# Patient Record
Sex: Male | Born: 1965 | Race: White | Hispanic: No | Marital: Married | Smoking: Never smoker
Health system: Southern US, Community
[De-identification: ages and names within clinical notes are randomized; demographics above are authoritative.]

## PROBLEM LIST (undated history)

## (undated) ENCOUNTER — Emergency Department (HOSPITAL_COMMUNITY): Admission: EM | Payer: Self-pay | Source: Home / Self Care

## (undated) DIAGNOSIS — M79672 Pain in left foot: Secondary | ICD-10-CM

## (undated) DIAGNOSIS — I4819 Other persistent atrial fibrillation: Secondary | ICD-10-CM

## (undated) DIAGNOSIS — F410 Panic disorder [episodic paroxysmal anxiety] without agoraphobia: Secondary | ICD-10-CM

## (undated) DIAGNOSIS — N486 Induration penis plastica: Secondary | ICD-10-CM

## (undated) DIAGNOSIS — R740 Nonspecific elevation of levels of transaminase and lactic acid dehydrogenase [LDH]: Secondary | ICD-10-CM

## (undated) DIAGNOSIS — R7301 Impaired fasting glucose: Secondary | ICD-10-CM

## (undated) DIAGNOSIS — Z8719 Personal history of other diseases of the digestive system: Secondary | ICD-10-CM

## (undated) DIAGNOSIS — I10 Essential (primary) hypertension: Secondary | ICD-10-CM

## (undated) DIAGNOSIS — E785 Hyperlipidemia, unspecified: Secondary | ICD-10-CM

## (undated) DIAGNOSIS — E1129 Type 2 diabetes mellitus with other diabetic kidney complication: Secondary | ICD-10-CM

## (undated) DIAGNOSIS — E8881 Metabolic syndrome: Secondary | ICD-10-CM

## (undated) DIAGNOSIS — M109 Gout, unspecified: Secondary | ICD-10-CM

## (undated) DIAGNOSIS — R7401 Elevation of levels of liver transaminase levels: Secondary | ICD-10-CM

## (undated) DIAGNOSIS — J3089 Other allergic rhinitis: Secondary | ICD-10-CM

## (undated) HISTORY — DX: Nonspecific elevation of levels of transaminase and lactic acid dehydrogenase (ldh): R74.0

## (undated) HISTORY — DX: Type 2 diabetes mellitus with other diabetic kidney complication: E11.29

## (undated) HISTORY — DX: Gout, unspecified: M10.9

## (undated) HISTORY — DX: Metabolic syndrome: E88.81

## (undated) HISTORY — DX: Pain in left foot: M79.672

## (undated) HISTORY — DX: Personal history of other diseases of the digestive system: Z87.19

## (undated) HISTORY — DX: Other persistent atrial fibrillation: I48.19

## (undated) HISTORY — DX: Other allergic rhinitis: J30.89

## (undated) HISTORY — DX: Hyperlipidemia, unspecified: E78.5

## (undated) HISTORY — DX: Elevation of levels of liver transaminase levels: R74.01

## (undated) HISTORY — DX: Impaired fasting glucose: R73.01

---

## 2012-03-21 ENCOUNTER — Encounter (HOSPITAL_COMMUNITY): Payer: Self-pay | Admitting: *Deleted

## 2012-03-21 ENCOUNTER — Emergency Department (HOSPITAL_COMMUNITY): Payer: BC Managed Care – PPO

## 2012-03-21 DIAGNOSIS — R42 Dizziness and giddiness: Secondary | ICD-10-CM | POA: Insufficient documentation

## 2012-03-21 DIAGNOSIS — Z8659 Personal history of other mental and behavioral disorders: Secondary | ICD-10-CM | POA: Insufficient documentation

## 2012-03-21 DIAGNOSIS — Z79899 Other long term (current) drug therapy: Secondary | ICD-10-CM | POA: Insufficient documentation

## 2012-03-21 DIAGNOSIS — R059 Cough, unspecified: Secondary | ICD-10-CM | POA: Insufficient documentation

## 2012-03-21 DIAGNOSIS — R05 Cough: Secondary | ICD-10-CM | POA: Insufficient documentation

## 2012-03-21 DIAGNOSIS — I4891 Unspecified atrial fibrillation: Secondary | ICD-10-CM | POA: Insufficient documentation

## 2012-03-21 DIAGNOSIS — R0602 Shortness of breath: Secondary | ICD-10-CM | POA: Insufficient documentation

## 2012-03-21 DIAGNOSIS — I10 Essential (primary) hypertension: Secondary | ICD-10-CM | POA: Insufficient documentation

## 2012-03-21 LAB — CBC WITH DIFFERENTIAL/PLATELET
Basophils Absolute: 0 10*3/uL (ref 0.0–0.1)
Basophils Relative: 0 % (ref 0–1)
Eosinophils Absolute: 0.4 10*3/uL (ref 0.0–0.7)
MCH: 31.4 pg (ref 26.0–34.0)
MCHC: 35.7 g/dL (ref 30.0–36.0)
Neutro Abs: 4.6 10*3/uL (ref 1.7–7.7)
Neutrophils Relative %: 49 % (ref 43–77)
Platelets: 184 10*3/uL (ref 150–400)

## 2012-03-21 LAB — POCT I-STAT, CHEM 8
Chloride: 105 mEq/L (ref 96–112)
HCT: 50 % (ref 39.0–52.0)
Hemoglobin: 17 g/dL (ref 13.0–17.0)
Potassium: 4.4 mEq/L (ref 3.5–5.1)
Sodium: 136 mEq/L (ref 135–145)

## 2012-03-21 LAB — POCT I-STAT TROPONIN I: Troponin i, poc: 0.03 ng/mL (ref 0.00–0.08)

## 2012-03-21 NOTE — ED Notes (Addendum)
Takes metoprolol and lisinopril.  After eating dinner, pt was seated and resting, developed fast HR, HA , light headedness and dry mouth. (Denies: cp, sob, dizziness), skin W&D. HR 142 in triage. No h/o same. Mentions cough for ~ 2 weeks, (denies: congestion, cold sx or fever), had 3 beers tonight, states, "probably drink too much". Took lopressor tonight.

## 2012-03-22 ENCOUNTER — Emergency Department (HOSPITAL_COMMUNITY)
Admission: EM | Admit: 2012-03-22 | Discharge: 2012-03-22 | Disposition: A | Discharge status: Home / Self Care | Payer: BC Managed Care – PPO | Attending: Emergency Medicine | Admitting: Emergency Medicine

## 2012-03-22 DIAGNOSIS — I4891 Unspecified atrial fibrillation: Secondary | ICD-10-CM

## 2012-03-22 HISTORY — DX: Essential (primary) hypertension: I10

## 2012-03-22 HISTORY — DX: Panic disorder (episodic paroxysmal anxiety): F41.0

## 2012-03-22 LAB — D-DIMER, QUANTITATIVE: D-Dimer, Quant: 0.29 ug/mL-FEU (ref 0.00–0.48)

## 2012-03-22 MED ORDER — DILTIAZEM HCL ER 180 MG PO CP24: 180.0000 mg | ORAL_CAPSULE | Freq: Every day | ORAL | Status: DC

## 2012-03-22 MED ORDER — DILTIAZEM HCL 30 MG PO TABS
30.0000 mg | ORAL_TABLET | Freq: Once | ORAL | Status: AC
  Administered 2012-03-22: 30 mg via ORAL
  Filled 2012-03-22: qty 1

## 2012-03-22 NOTE — ED Provider Notes (Signed)
History     CSN: [id number]  Arrival date & time 03/21/12  2018   First MD Initiated Contact with Patient 03/22/12 0029      Chief Complaint  Patient presents with  . Tachycardia    (Consider location/radiation/quality/duration/timing/severity/associated sxs/prior treatment) The history is provided by the patient.  Riley Moore is a 46 y.o. male here with palpitations, SOB. Acute onset of palpitations around 7 pm yesterday. Felt lightheaded but denies chest pain. He took his metoprolol and didn't help. He has been having nonproductive cough for 2 weeks and occasionally felt SOB. Denies recent travel or hx of DVT/PE.    Past Medical History  Diagnosis Date  . Hypertension   . Anxiety attack     History reviewed. No pertinent past surgical history.  No family history on file.  History  Substance Use Topics  . Smoking status: Never Smoker   . Smokeless tobacco: Not on file  . Alcohol Use: Yes     Comment: "probably too much"      Review of Systems  Cardiovascular: Positive for palpitations.  All other systems reviewed and are negative.    Allergies  Review of patient's allergies indicates no known allergies.  Home Medications  No current outpatient prescriptions on file.  BP 108/67  Pulse 80  Temp 98.5 F (36.9 C) (Oral)  Resp 15  SpO2 94%  Physical Exam  Nursing note and vitals reviewed. Constitutional: He is oriented to person, place, and time. He appears well-developed and well-nourished.  HENT:  Head: Normocephalic.  Mouth/Throat: Oropharynx is clear and moist.  Eyes: Conjunctivae normal are normal. Pupils are equal, round, and reactive to light.  Neck: Normal range of motion. Neck supple.  Cardiovascular: Normal heart sounds.        Irregular   Pulmonary/Chest: Effort normal and breath sounds normal. No respiratory distress. He has no wheezes. He has no rales.  Abdominal: Soft. Bowel sounds are normal. He exhibits no distension. There is no  tenderness. There is no rebound.  Musculoskeletal: Normal range of motion. He exhibits no edema and no tenderness.  Neurological: He is alert and oriented to person, place, and time.  Skin: Skin is warm and dry.  Psychiatric: He has a normal mood and affect. His behavior is normal. Judgment and thought content normal.    ED Course  Procedures (including critical care time)  Labs Reviewed  POCT I-STAT, CHEM 8 - Abnormal; Notable for the following:    Glucose, Bld 168 (*)     Calcium, Ion 1.01 (*)     All other components within normal limits  CBC WITH DIFFERENTIAL  POCT I-STAT TROPONIN I  D-DIMER, QUANTITATIVE  POCT I-STAT TROPONIN I   Dg Chest 2 View  03/21/2012  *RADIOLOGY REPORT*  Clinical Data: Tachycardia  CHEST - 2 VIEW  Comparison: None.  Findings: The heart and pulmonary vascularity are within normal limits.  The lungs are clear bilaterally.  No acute bony abnormality is seen.  IMPRESSION: No acute abnormality noted.   Original Report Authenticated By: Alcide Clever, M.D.      No diagnosis found.   Date: 03/22/2012  Rate: 121  Rhythm: atrial fibrillation  QRS Axis: normal  Intervals: normal  ST/T Wave abnormalities: normal  Conduction Disutrbances:none  Narrative Interpretation:   Old EKG Reviewed: changes noted    MDM  Riley Moore is a 46 y.o. male here with new onset afib. Will give d-dimer given SOB to r/o PE. HR between 90s-110. Will give cardizem.  Will consult cardiology.   1 AM  I talked to Dr. Veryl Speak, cardiologist on call. He recommend slowing with cardizem and start diltiazem 180mg  daily and get echo outpatient.   2:06 AM Patient's HR in the 80s. Felt better. Trop neg x 2, d-dimer negative. Will d/c home with outpatient f/u.       Richardean Canal, MD 03/22/12 [phone]

## 2012-03-22 NOTE — ED Notes (Signed)
Pt's name moved to B17 called to go back but pt did not answer. Pt's name moved back to waiting room.

## 2012-04-03 HISTORY — PX: TRANSTHORACIC ECHOCARDIOGRAM: SHX275

## 2012-04-05 ENCOUNTER — Emergency Department (HOSPITAL_COMMUNITY)
Admission: EM | Admit: 2012-04-05 | Discharge: 2012-04-05 | Disposition: A | Discharge status: Home / Self Care | Payer: BC Managed Care – PPO | Attending: Emergency Medicine | Admitting: Emergency Medicine

## 2012-04-05 ENCOUNTER — Encounter (HOSPITAL_COMMUNITY): Payer: Self-pay | Admitting: Emergency Medicine

## 2012-04-05 ENCOUNTER — Emergency Department (HOSPITAL_COMMUNITY): Payer: BC Managed Care – PPO

## 2012-04-05 DIAGNOSIS — I10 Essential (primary) hypertension: Secondary | ICD-10-CM | POA: Insufficient documentation

## 2012-04-05 DIAGNOSIS — F411 Generalized anxiety disorder: Secondary | ICD-10-CM | POA: Insufficient documentation

## 2012-04-05 DIAGNOSIS — Z7982 Long term (current) use of aspirin: Secondary | ICD-10-CM | POA: Insufficient documentation

## 2012-04-05 DIAGNOSIS — R079 Chest pain, unspecified: Secondary | ICD-10-CM | POA: Insufficient documentation

## 2012-04-05 DIAGNOSIS — Z79899 Other long term (current) drug therapy: Secondary | ICD-10-CM | POA: Insufficient documentation

## 2012-04-05 DIAGNOSIS — I4891 Unspecified atrial fibrillation: Secondary | ICD-10-CM | POA: Insufficient documentation

## 2012-04-05 LAB — CBC WITH DIFFERENTIAL/PLATELET
Basophils Relative: 0 % (ref 0–1)
Eosinophils Absolute: 0.3 10*3/uL (ref 0.0–0.7)
Eosinophils Relative: 2 % (ref 0–5)
Lymphs Abs: 4.1 10*3/uL — ABNORMAL HIGH (ref 0.7–4.0)
MCH: 30.8 pg (ref 26.0–34.0)
MCHC: 34.7 g/dL (ref 30.0–36.0)
MCV: 88.6 fL (ref 78.0–100.0)
Platelets: 209 10*3/uL (ref 150–400)
RBC: 5.33 MIL/uL (ref 4.22–5.81)
RDW: 12.7 % (ref 11.5–15.5)

## 2012-04-05 LAB — POCT I-STAT TROPONIN I

## 2012-04-05 LAB — BASIC METABOLIC PANEL
BUN: 14 mg/dL (ref 6–23)
Calcium: 9.7 mg/dL (ref 8.4–10.5)
GFR calc non Af Amer: >90 mL/min (ref >90)
Glucose, Bld: 137 mg/dL — ABNORMAL HIGH (ref 70–99)
Sodium: 133 mEq/L — ABNORMAL LOW (ref 135–145)

## 2012-04-05 NOTE — ED Notes (Signed)
Pt states "at 1100 i felt like someone poked me in the chest on the right side, it went away and then I had a light spell that lasted for a few seconds." Pt denies any other sx. Pt denies any symptoms currently.

## 2012-04-05 NOTE — ED Notes (Signed)
Pt c/o right sided chest pressure today with lightheadedness; pt sts seen in Nov for irregular HR; pt denies SOB at present

## 2012-04-05 NOTE — ED Provider Notes (Signed)
History     CSN: [id number]  Arrival date & time 04/05/12  1126   First MD Initiated Contact with Patient 04/05/12 1620      Chief Complaint  Patient presents with  . Chest Pain    (Consider location/radiation/quality/duration/timing/severity/associated sxs/prior treatment) The history is provided by the patient.  Riley Moore is a 46 y.o. male who presents with an episode of a right sided sharp chest pain that occurred around 11am today while sitting at his computer. Pt states pain lasted just few seconds, and state it followed by few seconds of light headiness. States no symptoms since then. Denies shortness of breath, nausea, diaphoresis. Recent diagnosis of afib. States taking medications for it. Has not seen cardiologist, but has an apt in two weeks. Denies palpitations. No other complaints. States has been very anxious about his afib and has been started on paxil.     Past Medical History  Diagnosis Date  . Hypertension   . Anxiety attack     History reviewed. No pertinent past surgical history.  History reviewed. No pertinent family history.  History  Substance Use Topics  . Smoking status: Never Smoker   . Smokeless tobacco: Not on file  . Alcohol Use: Yes     Comment: "probably too much"      Review of Systems  Constitutional: Negative for fever and chills.  Respiratory: Negative for chest tightness, shortness of breath and wheezing.   Cardiovascular: Positive for chest pain. Negative for palpitations and leg swelling.  Gastrointestinal: Negative for nausea, vomiting and abdominal pain.  Neurological: Positive for dizziness and light-headedness. Negative for weakness and headaches.  All other systems reviewed and are negative.    Allergies  Review of patient's allergies indicates no known allergies.  Home Medications   Current Outpatient Rx  Name  Route  Sig  Dispense  Refill  . ASPIRIN 325 MG PO TABS   Oral   Take 325 mg by mouth daily.         Marland Kitchen  DILTIAZEM HCL ER 180 MG PO CP24   Oral   Take 180 mg by mouth every morning.         . IBUPROFEN 200 MG PO TABS   Oral   Take 200 mg by mouth every 6 (six) hours as needed. For pain         . LISINOPRIL 20 MG PO TABS   Oral   Take 20 mg by mouth daily.         Marland Kitchen METOPROLOL TARTRATE 50 MG PO TABS   Oral   Take 50 mg by mouth 2 (two) times daily.         Marland Kitchen PAROXETINE HCL 10 MG PO TABS   Oral   Take 10 mg by mouth every morning.         Marland Kitchen PRAVASTATIN SODIUM 20 MG PO TABS   Oral   Take 20 mg by mouth daily.           BP 174/95  Pulse 53  Temp 97.8 F (36.6 C) (Oral)  Resp 18  SpO2 97%  Physical Exam  Constitutional: He is oriented to person, place, and time. He appears well-developed and well-nourished. No distress.  HENT:  Head: Normocephalic.  Eyes: Conjunctivae normal are normal.  Neck: Neck supple.  Cardiovascular: Normal rate, regular rhythm and normal heart sounds.   Pulmonary/Chest: Effort normal and breath sounds normal. No respiratory distress. He has no wheezes. He has no rales.  Abdominal:  Soft. Bowel sounds are normal. He exhibits no distension. There is no tenderness. There is no rebound.  Musculoskeletal: He exhibits no edema.  Neurological: He is alert and oriented to person, place, and time.  Skin: Skin is warm and dry.  Psychiatric: He has a normal mood and affect. His behavior is normal.    ED Course  Procedures (including critical care time)   Date: 04/05/2012  Rate:  64  Rhythm: normal sinus rhythm  QRS Axis: normal  Intervals: normal  ST/T Wave abnormalities: normal  Conduction Disutrbances:none  Narrative Interpretation:   Old EKG Reviewed: changes noted tachycardia resolved  Results for orders placed during the hospital encounter of 04/05/12  CBC WITH DIFFERENTIAL      Component Value Range   WBC 12.4 (*) 4.0 - 10.5 K/uL   RBC 5.33  4.22 - 5.81 MIL/uL   Hemoglobin 16.4  13.0 - 17.0 g/dL   HCT 40.9  81.1 - 91.4 %   MCV  88.6  78.0 - 100.0 fL   MCH 30.8  26.0 - 34.0 pg   MCHC 34.7  30.0 - 36.0 g/dL   RDW 78.2  95.6 - 21.3 %   Platelets 209  150 - 400 K/uL   Neutrophils Relative 55  43 - 77 %   Neutro Abs 6.8  1.7 - 7.7 K/uL   Lymphocytes Relative 33  12 - 46 %   Lymphs Abs 4.1 (*) 0.7 - 4.0 K/uL   Monocytes Relative 10  3 - 12 %   Monocytes Absolute 1.2 (*) 0.1 - 1.0 K/uL   Eosinophils Relative 2  0 - 5 %   Eosinophils Absolute 0.3  0.0 - 0.7 K/uL   Basophils Relative 0  0 - 1 %   Basophils Absolute 0.0  0.0 - 0.1 K/uL  BASIC METABOLIC PANEL      Component Value Range   Sodium 133 (*) 135 - 145 mEq/L   Potassium 3.7  3.5 - 5.1 mEq/L   Chloride 95 (*) 96 - 112 mEq/L   CO2 27  19 - 32 mEq/L   Glucose, Bld 137 (*) 70 - 99 mg/dL   BUN 14  6 - 23 mg/dL   Creatinine, Ser 0.86  0.50 - 1.35 mg/dL   Calcium 9.7  8.4 - 57.8 mg/dL   GFR calc non Af Amer >90  >90 mL/min   GFR calc Af Amer >90  >90 mL/min  POCT I-STAT TROPONIN I      Component Value Range   Troponin i, poc 0.00  0.00 - 0.08 ng/mL   Comment 3            Dg Chest 2 View  04/05/2012  *RADIOLOGY REPORT*  Clinical Data: Chest pain  CHEST - 2 VIEW  Comparison: March 21, 2012  Findings: Lungs clear.  Heart size and pulmonary vascularity are normal.  No adenopathy.  No bone lesions.  No pneumothorax.  IMPRESSION: Lungs clear.   Original Report Authenticated By: Bretta Bang, M.D.         1. Chest pain       MDM  PT with brief episode of right sided chest pain that was sharp in nature. Very atypical for ACS. No symptoms since 11am. Doubt PE: VS normal except for hypertension, pt is asymptomatic. Pt did not take his medications for hypertension today. Pt low risk for ACS. ECG completely normal. Pt appears very anxious. Discussed with Dr. Bebe Shaggy who has seen pt, agrees most likely not  cardiac, not a PE. Normal lungs on CXR. Pt stable for d/c home at this time. Follow up with PCP.        Lottie Mussel, PA 04/06/12 [phone]

## 2012-04-07 NOTE — ED Provider Notes (Signed)
Medical screening examination/treatment/procedure(s) were conducted as a shared visit with non-physician practitioner(s) and myself.  I personally evaluated the patient during the encounter  Pt resting comfortably, stable for d/c.  I doubt ACS/PE at this time.  EKG reviewed  Joya Gaskins, MD 04/07/12 [phone]

## 2012-04-19 ENCOUNTER — Encounter: Payer: Self-pay | Admitting: Cardiology

## 2012-04-19 ENCOUNTER — Ambulatory Visit (INDEPENDENT_AMBULATORY_CARE_PROVIDER_SITE_OTHER): Payer: BC Managed Care – PPO | Admitting: Cardiology

## 2012-04-19 VITALS — BP 135/100 | HR 59 | Ht 73.0 in | Wt 218.4 lb

## 2012-04-19 DIAGNOSIS — I4892 Unspecified atrial flutter: Secondary | ICD-10-CM

## 2012-04-19 NOTE — Progress Notes (Signed)
HPI The patient presents for a evaluation of atrial flutter. He was in the emergency room on November 18 with tachypalpitations. He was said to have atrial fibrillation though it appears likely to be flutter with 2:1 conduction. He apparently converted spontaneously.  He was placed on Cardizem when he left the ER. Since that time he has had no further sustained tachypalpitations. He did say that at one point prior to this when exercising he thought he noticed his heart rate increasing. He does say he has a stressful job. He drinks about 3 beers per night. He doesn't exercise routinely. He denies any chest pressure, neck or arm discomfort. He doesn't have any resting shortness of breath and he denies any PND or orthopnea. When he had his symptoms he had no chest discomfort. He had no presyncope or syncope. He did get anxious.  No Known Allergies  Current Outpatient Prescriptions  Medication Sig Dispense Refill  . aspirin 325 MG tablet Take 325 mg by mouth daily.      Marland Kitchen diltiazem (DILACOR XR) 180 MG 24 hr capsule Take 180 mg by mouth every morning.      Marland Kitchen lisinopril (PRINIVIL,ZESTRIL) 20 MG tablet Take 20 mg by mouth daily.      . metoprolol (LOPRESSOR) 50 MG tablet Take 50 mg by mouth 2 (two) times daily.        Past Medical History  Diagnosis Date  . Hypertension   . Anxiety attack   . Hyperlipemia     Past Surgical History  Procedure Date  . None     Family History  Problem Relation Age of Onset  . Heart disease Father     pacemaker, atrial fibrillation    History   Social History  . Marital Status: Single    Spouse Name: N/A    Number of Children: 2  . Years of Education: N/A   Occupational History  . Radio broadcast assistant    Social History Main Topics  . Smoking status: Never Smoker   . Smokeless tobacco: Not on file  . Alcohol Use: Yes     Comment: "probably too much"  . Drug Use: No  . Sexually Active:    Other Topics Concern  . Not on file   Social  History Narrative   Lives at home with wife and two children.      ROS: Positive for seasonal allergies.  Otherwise as stated in the HPI and negative for all other systems.   PHYSICAL EXAM BP 135/100  Pulse 59  Ht 6\' 1"  (1.854 m)  Wt 218 lb 6.4 oz (99.066 kg)  BMI 28.81 kg/m2 GENERAL:  Well appearing HEENT:  Pupils equal round and reactive, fundi not visualized, oral mucosa unremarkable NECK:  No jugular venous distention, waveform within normal limits, carotid upstroke brisk and symmetric, no bruits, no thyromegaly LYMPHATICS:  No cervical, inguinal adenopathy LUNGS:  Clear to auscultation bilaterally BACK:  No CVA tenderness CHEST:  Unremarkable HEART:  PMI not displaced or sustained,S1 and S2 within normal limits, no S3, no S4, no clicks, no rubs, no murmurs ABD:  Flat, positive bowel sounds normal in frequency in pitch, no bruits, no rebound, no guarding, no midline pulsatile mass, no hepatomegaly, no splenomegaly EXT:  2 plus pulses throughout, no edema, no cyanosis no clubbing SKIN:  No rashes no nodules NEURO:  Cranial nerves II through XII grossly intact, motor grossly intact throughout PSYCH:  Cognitively intact, oriented to person place and time  EKG:  Sinus  rhythm, rate 59, axis within normal limits, intervals within normal limits, no acute ST-T wave changes.04/19/2012   ASSESSMENT AND PLAN   Atrial flutter - We discussed the mechanism of this at length. He will remain on the meds as listed. I will check an echocardiogram. He will start an exercise regimen and if he has tachypalpitations with this he will let me know as I would likely need to screen him with a treadmill test or a monitor. He does not need anticoagulation or aspirin.    Hypertension - He will keep a blood pressure diary. Otherwise meds will remain as listed.  Alcohol use - We discussed the fact that his current use is above guidelines suggestions. He will try to cut back.

## 2012-04-19 NOTE — Patient Instructions (Addendum)
The current medical regimen is effective;  continue present plan and medications.  Your physician has requested that you have an echocardiogram. Echocardiography is a painless test that uses sound waves to create images of your heart. It provides your doctor with information about the size and shape of your heart and how well your heart's chambers and valves are working. This procedure takes approximately one hour. There are no restrictions for this procedure.  Follow up with Dr Antoine Poche in 3 months.

## 2012-04-20 ENCOUNTER — Telehealth: Payer: Self-pay | Admitting: Cardiology

## 2012-04-20 NOTE — Telephone Encounter (Signed)
New problem:   [facility] [place]

## 2012-04-22 MED ORDER — DILTIAZEM HCL ER 180 MG PO CP24: 180.0000 mg | ORAL_CAPSULE | Freq: Every morning | ORAL | Status: DC

## 2012-04-22 NOTE — Telephone Encounter (Signed)
Pt needed diltiazem sent in. RX sent into pharmacy.

## 2012-04-25 ENCOUNTER — Ambulatory Visit (HOSPITAL_COMMUNITY): Payer: BC Managed Care – PPO | Attending: Cardiology

## 2012-04-25 DIAGNOSIS — I4892 Unspecified atrial flutter: Secondary | ICD-10-CM | POA: Insufficient documentation

## 2012-04-25 DIAGNOSIS — R000 Tachycardia, unspecified: Secondary | ICD-10-CM | POA: Insufficient documentation

## 2012-04-25 DIAGNOSIS — R002 Palpitations: Secondary | ICD-10-CM | POA: Insufficient documentation

## 2012-04-25 NOTE — Progress Notes (Signed)
Echocardiogram performed.  

## 2012-05-09 ENCOUNTER — Encounter: Payer: Self-pay | Admitting: Cardiology

## 2012-07-22 ENCOUNTER — Encounter: Payer: Self-pay | Admitting: Cardiology

## 2012-07-22 ENCOUNTER — Ambulatory Visit (INDEPENDENT_AMBULATORY_CARE_PROVIDER_SITE_OTHER): Payer: BC Managed Care – PPO | Admitting: Cardiology

## 2012-07-22 VITALS — BP 134/78 | HR 55 | Ht 74.0 in | Wt 217.0 lb

## 2012-07-22 DIAGNOSIS — I4892 Unspecified atrial flutter: Secondary | ICD-10-CM

## 2012-07-22 DIAGNOSIS — I25810 Atherosclerosis of coronary artery bypass graft(s) without angina pectoris: Secondary | ICD-10-CM | POA: Insufficient documentation

## 2012-07-22 NOTE — Patient Instructions (Addendum)
Please stop your Cardiazem.  Continue all other medications as listed.  Follow up as needed.

## 2012-07-22 NOTE — Progress Notes (Signed)
   HPI The patient presents for a evaluation of atrial flutter. He was in the emergency room on November 18 with tachypalpitations. He was said to have atrial fibrillation though it appears likely to be flutter with 2:1 conduction. He converted spontaneously.  This is his second visit in the office. Since I last saw him he try to come off of his Cardizem and he did have some palpitations so he restarted. He's not had any sustained tachypalpitations. She staying active and otherwise feels well. The patient denies any new symptoms such as chest discomfort, neck or arm discomfort. There has been no new shortness of breath, PND or orthopnea. There have been no reported palpitations, presyncope or syncope. He still drinking 3 beers a night.  No Known Allergies  Current Outpatient Prescriptions  Medication Sig Dispense Refill  . aspirin 325 MG tablet Take 325 mg by mouth daily.      . cetirizine (ZYRTEC) 10 MG tablet Take 10 mg by mouth daily.      Marland Kitchen diltiazem (DILACOR XR) 180 MG 24 hr capsule Take 1 capsule (180 mg total) by mouth every morning.  30 capsule  6  . lisinopril (PRINIVIL,ZESTRIL) 20 MG tablet Take 20 mg by mouth daily.      . metoprolol (LOPRESSOR) 50 MG tablet Take 50 mg by mouth 2 (two) times daily.       No current facility-administered medications for this visit.    Past Medical History  Diagnosis Date  . Hypertension   . Anxiety attack   . Hyperlipemia     Past Surgical History  Procedure Laterality Date  . None      ROS: As stated in the HPI and negative for all other systems.   PHYSICAL EXAM BP 134/78  Pulse 55  Ht 6\' 2"  (1.88 m)  Wt 217 lb (98.431 kg)  BMI 27.85 kg/m2 GENERAL:  Well appearing NECK:  No jugular venous distention, waveform within normal limits, carotid upstroke brisk and symmetric, no bruits, no thyromegaly LUNGS:  Clear to auscultation bilaterally CHEST:  Unremarkable HEART:  PMI not displaced or sustained,S1 and S2 within normal limits, no  S3, no S4, no clicks, no rubs, no murmurs ABD:  Flat, positive bowel sounds normal in frequency in pitch, no bruits, no rebound, no guarding, no midline pulsatile mass, no hepatomegaly, no splenomegaly EXT:  2 plus pulses throughout, no edema, no cyanosis no clubbing  EKG:  Sinus rhythm, rate 55, axis within normal limits, intervals within normal limits, no acute ST-T wave changes.07/22/2012   ASSESSMENT AND PLAN   Atrial flutter - We discussed the mechanism of this at length. He is going to try to stop the Cardizem again. If palpitations return he will restart this. If and when he has these palpitations in the future he will return for followup and we would consider ablation.  Hypertension - The blood pressure is at target. No change in medications is indicated. We will continue with therapeutic lifestyle changes (TLC).  Alcohol use - We again discussed the fact that his current use is above guidelines suggestions. He will try to cut back.

## 2013-07-28 ENCOUNTER — Ambulatory Visit: Payer: BC Managed Care – PPO | Admitting: Diagnostic Neuroimaging

## 2014-03-16 ENCOUNTER — Other Ambulatory Visit: Payer: Self-pay

## 2014-03-16 NOTE — Telephone Encounter (Signed)
Pt called stating he has a new patient appt with Dr Anitra Lauth next Thursday. His previous doctor would not refill his medications and wondered if Dr Anitra Lauth can Rx Metoprolol 50 mg and lisinopril 20mg  until his appt. Please call him at [phone] to let him know. Thanks

## 2014-03-16 NOTE — Telephone Encounter (Signed)
Tell him I'm sorry but I simply cannot do this if I've never seen him before BUT I see that his cardiologist is Dr. Percival Spanish so I recommend he call their office and request refill.-thx

## 2014-03-16 NOTE — Telephone Encounter (Signed)
Spoke with pt, advised message from Dr McGowen. Pt understood. 

## 2014-03-22 ENCOUNTER — Ambulatory Visit (INDEPENDENT_AMBULATORY_CARE_PROVIDER_SITE_OTHER): Payer: BC Managed Care – PPO | Admitting: Family Medicine

## 2014-03-22 ENCOUNTER — Encounter: Payer: Self-pay | Admitting: Family Medicine

## 2014-03-22 VITALS — BP 124/86 | HR 55 | Temp 98.3°F | Resp 18 | Ht 74.0 in | Wt 218.0 lb

## 2014-03-22 DIAGNOSIS — R7401 Elevation of levels of liver transaminase levels: Secondary | ICD-10-CM

## 2014-03-22 DIAGNOSIS — R740 Nonspecific elevation of levels of transaminase and lactic acid dehydrogenase [LDH]: Secondary | ICD-10-CM

## 2014-03-22 DIAGNOSIS — Z23 Encounter for immunization: Secondary | ICD-10-CM | POA: Diagnosis not present

## 2014-03-22 DIAGNOSIS — I10 Essential (primary) hypertension: Secondary | ICD-10-CM | POA: Insufficient documentation

## 2014-03-22 DIAGNOSIS — R7301 Impaired fasting glucose: Secondary | ICD-10-CM

## 2014-03-22 DIAGNOSIS — I4892 Unspecified atrial flutter: Secondary | ICD-10-CM

## 2014-03-22 DIAGNOSIS — E785 Hyperlipidemia, unspecified: Secondary | ICD-10-CM

## 2014-03-22 DIAGNOSIS — E782 Mixed hyperlipidemia: Secondary | ICD-10-CM

## 2014-03-22 DIAGNOSIS — F1010 Alcohol abuse, uncomplicated: Secondary | ICD-10-CM

## 2014-03-22 LAB — CBC WITH DIFFERENTIAL/PLATELET
BASOS ABS: 0 10*3/uL (ref 0.0–0.1)
BASOS PCT: 0.4 % (ref 0.0–3.0)
EOS ABS: 0.3 10*3/uL (ref 0.0–0.7)
Eosinophils Relative: 4 % (ref 0.0–5.0)
HCT: 50.3 % (ref 39.0–52.0)
Hemoglobin: 16.8 g/dL (ref 13.0–17.0)
LYMPHS PCT: 30.5 % (ref 12.0–46.0)
Lymphs Abs: 2.6 10*3/uL (ref 0.7–4.0)
MCHC: 33.4 g/dL (ref 30.0–36.0)
MCV: 91.7 fl (ref 78.0–100.0)
MONO ABS: 0.7 10*3/uL (ref 0.1–1.0)
Monocytes Relative: 8.3 % (ref 3.0–12.0)
NEUTROS PCT: 56.8 % (ref 43.0–77.0)
Neutro Abs: 4.7 10*3/uL (ref 1.4–7.7)
PLATELETS: 183 10*3/uL (ref 150.0–400.0)
RBC: 5.48 Mil/uL (ref 4.22–5.81)
RDW: 12.9 % (ref 11.5–15.5)
WBC: 8.4 10*3/uL (ref 4.0–10.5)

## 2014-03-22 LAB — COMPREHENSIVE METABOLIC PANEL
ALT: 102 U/L — AB (ref 0–53)
AST: 64 U/L — ABNORMAL HIGH (ref 0–37)
Albumin: 4.2 g/dL (ref 3.5–5.2)
Alkaline Phosphatase: 49 U/L (ref 39–117)
BUN: 16 mg/dL (ref 6–23)
CALCIUM: 9.6 mg/dL (ref 8.4–10.5)
CHLORIDE: 103 meq/L (ref 96–112)
CO2: 28 meq/L (ref 19–32)
Creatinine, Ser: 0.9 mg/dL (ref 0.4–1.5)
GFR: 97.6 mL/min (ref >60.00)
Glucose, Bld: 112 mg/dL — ABNORMAL HIGH (ref 70–99)
Potassium: 4.9 mEq/L (ref 3.5–5.1)
Sodium: 139 mEq/L (ref 135–145)
Total Bilirubin: 0.9 mg/dL (ref 0.2–1.2)
Total Protein: 7.1 g/dL (ref 6.0–8.3)

## 2014-03-22 LAB — LIPID PANEL
CHOLESTEROL: 202 mg/dL — AB (ref 0–200)
HDL: 32.8 mg/dL — ABNORMAL LOW (ref >39.00)
NONHDL: 169.2
TRIGLYCERIDES: 348 mg/dL — AB (ref 0.0–149.0)
Total CHOL/HDL Ratio: 6
VLDL: 69.6 mg/dL — ABNORMAL HIGH (ref 0.0–40.0)

## 2014-03-22 LAB — TSH: TSH: 1.96 u[IU]/mL (ref 0.35–4.50)

## 2014-03-22 MED ORDER — CLOTRIMAZOLE-BETAMETHASONE 1-0.05 % EX CREA: 1.0000 "application " | TOPICAL_CREAM | Freq: Two times a day (BID) | CUTANEOUS | Status: DC

## 2014-03-22 MED ORDER — METOPROLOL TARTRATE 50 MG PO TABS: 50.0000 mg | ORAL_TABLET | Freq: Two times a day (BID) | ORAL | Status: DC

## 2014-03-22 NOTE — Assessment & Plan Note (Signed)
Continue welchol. FLP, liver panel today.

## 2014-03-22 NOTE — Assessment & Plan Note (Addendum)
Controlled.  Will change his metoprolol back to lopressor 50mg  bid like he was on before recent mistake/inadvertent switch to Toprol XL. Lytes and TSH check today. Keep annual f/u with Dr. Percival Spanish.

## 2014-03-22 NOTE — Progress Notes (Signed)
Pre visit review using our clinic review tool, if applicable. No additional management support is needed unless otherwise documented below in the visit note. 

## 2014-03-22 NOTE — Assessment & Plan Note (Signed)
The current medical regimen is effective;  continue present plan and medications. Lytes/cr today. 

## 2014-03-22 NOTE — Assessment & Plan Note (Signed)
I told him that cutting back to about 2 drinks a day would be more appropriate intake, plus would likely make his bp and atrial flutter easier to control.

## 2014-03-22 NOTE — Progress Notes (Signed)
Office Note 03/22/2014  CC:  Chief Complaint  Patient presents with  . Establish Care    HPI:  Riley Moore is a 48 y.o. White male who is here to establish care. Patient's most recent primary MD: Dr. Greta Doom. Old records were not reviewed prior to or during today's visit.  Establishing care with me b/c "Dr. Greta Doom got taken over by [facility] and now they will only see you for one problem at a time". He is fasting today, says it has been "a while" since he has had cholesterol check, doesn't recall when other labs done last. Says he works a busy, stressful job, plus had trouble getting in to see Dr. Greta Doom in a timely manner.  Says he has hx of armpit rash that responded to lotrisone in the past.  Recently he got a similar rash in GU area and he has been applying this cream to it and it has been helping but he is out of the cream now, asks for RF.  Says most recent RF of metoprolol by Dr. Greta Doom office was wrong: he was on lopressor 50mg  bid and they called in Toprol XL 50mg  qd.  Currently denies any palpitations, SOB, or chest pain.  When he exercises vigorously, which is apparently rare, he sometimes feels his heart begin to race so he stops.  This has made him "Antionette Poles" about exercise now.  Past Medical History  Diagnosis Date  . Hypertension   . Anxiety attack   . Hyperlipemia     Intolerant of statins: tolerates welchol  . Perennial allergic rhinitis   . Atrial flutter 2013    Dr. Percival Spanish    Past Surgical History  Procedure Laterality Date  . None      Family History  Problem Relation Age of Onset  . Heart disease Father     pacemaker, atrial fibrillation  . Hypertension Father     History   Social History  . Marital Status: Single    Spouse Name: N/A    Number of Children: 2  . Years of Education: N/A   Occupational History  . Chief Financial Officer    Social History Main Topics  . Smoking status: Never Smoker   . Smokeless tobacco: Never Used  . Alcohol  Use: 2.4 - 3.0 oz/week    4-5 Cans of beer per week     Comment: daily  . Drug Use: No  . Sexual Activity: Not on file   Other Topics Concern  . Not on file   Social History Narrative   Lives at home with wife and two children.     Occupation: Chief Financial Officer.   No tob or drugs.  Alc: 4 beers per day.  Denies hx of alc problems/abuse.  However, he says he tried quitting in the past and after a few days he started to have "panic attacks".    Outpatient Encounter Prescriptions as of 03/22/2014  Medication Sig  . aspirin 325 MG tablet Take 325 mg by mouth daily.  . cetirizine (ZYRTEC) 10 MG tablet Take 10 mg by mouth daily.  . clonazePAM (KLONOPIN) 0.5 MG tablet Take 0.5 mg by mouth as needed for anxiety.  Marland Kitchen diltiazem (CARDIZEM CD) 180 MG 24 hr capsule Take 180 mg by mouth daily.  Marland Kitchen lisinopril (PRINIVIL,ZESTRIL) 40 MG tablet Take 40 mg by mouth daily.  . metoprolol (LOPRESSOR) 50 MG tablet Take 1 tablet (50 mg total) by mouth 2 (two) times daily.  . [DISCONTINUED] metoprolol (LOPRESSOR) 50 MG tablet  Take 50 mg by mouth 2 (two) times daily.  . clotrimazole-betamethasone (LOTRISONE) cream Apply 1 application topically 2 (two) times daily.  . [DISCONTINUED] lisinopril (PRINIVIL,ZESTRIL) 20 MG tablet Take 20 mg by mouth daily.    No Known Allergies  ROS Review of Systems  Constitutional: Negative for fever and fatigue.  HENT: Negative for congestion and sore throat.   Eyes: Negative for visual disturbance.  Respiratory: Negative for cough.   Cardiovascular: Negative for chest pain.  Gastrointestinal: Negative for nausea and abdominal pain.  Genitourinary: Negative for dysuria.  Musculoskeletal: Negative for back pain and joint swelling.  Skin: Positive for rash (GU region, itchy).  Neurological: Negative for weakness and headaches.  Hematological: Negative for adenopathy.  Psychiatric/Behavioral: The patient is nervous/anxious (stressed).     PE; Blood pressure 124/86,  pulse 55, temperature 98.3 F (36.8 C), temperature source Temporal, resp. rate 18, height 6\' 2"  (1.88 m), weight 218 lb (98.884 kg), SpO2 98 %. Gen: Alert, well appearing.  Patient is oriented to person, place, time, and situation. ONG:EXBM: no injection, icteris, swelling, or exudate.  EOMI, PERRLA. Mouth: lips without lesion/swelling.  Oral mucosa pink and moist. Oropharynx without erythema, exudate, or swelling.  Neck: supple/nontender.  No LAD, mass, or TM.  Carotid pulses 2+ bilaterally, without bruits. CV: RRR, no m/r/g.   LUNGS: CTA bilat, nonlabored resps, good aeration in all lung fields. EXT: no clubbing, cyanosis, or edema.   Pertinent labs:  none   ASSESSMENT AND PLAN:   New pt; obtain old records.  Essential hypertension The current medical regimen is effective;  continue present plan and medications. Lytes/cr today.  Atrial flutter Controlled.  Will change his metoprolol back to lopressor 50mg  bid like he was on before recent mistake/inadvertent switch to Toprol XL. Lytes and TSH check today. Keep annual f/u with Dr. Percival Spanish.  Hyperlipidemia Continue welchol. FLP, liver panel today.  Alcohol abuse I told him that cutting back to about 2 drinks a day would be more appropriate intake, plus would likely make his bp and atrial flutter easier to control.   An After Visit Summary was printed and given to the patient.  Return in about 1 year (around 03/23/2015).

## 2014-03-23 ENCOUNTER — Other Ambulatory Visit (INDEPENDENT_AMBULATORY_CARE_PROVIDER_SITE_OTHER): Payer: BC Managed Care – PPO

## 2014-03-23 ENCOUNTER — Telehealth: Payer: Self-pay | Admitting: Family Medicine

## 2014-03-23 DIAGNOSIS — R7309 Other abnormal glucose: Secondary | ICD-10-CM

## 2014-03-23 DIAGNOSIS — R739 Hyperglycemia, unspecified: Secondary | ICD-10-CM

## 2014-03-23 LAB — HEMOGLOBIN A1C: Hgb A1c MFr Bld: 5.9 % (ref 4.6–6.5)

## 2014-03-23 LAB — LDL CHOLESTEROL, DIRECT: Direct LDL: 106.3 mg/dL

## 2014-03-23 NOTE — Addendum Note (Signed)
Addended by: Jannette Spanner on: 03/23/2014 01:14 PM   Modules accepted: Orders

## 2014-03-23 NOTE — Telephone Encounter (Signed)
emmi emailed °

## 2014-03-24 LAB — HEPATITIS B SURFACE ANTIGEN: HEP B S AG: NEGATIVE

## 2014-03-24 LAB — HEPATITIS C ANTIBODY: HCV AB: NEGATIVE

## 2014-03-26 ENCOUNTER — Encounter: Payer: Self-pay | Admitting: Family Medicine

## 2014-03-27 ENCOUNTER — Encounter: Payer: Self-pay | Admitting: Family Medicine

## 2014-04-10 ENCOUNTER — Telehealth: Payer: Self-pay | Admitting: Family Medicine

## 2014-04-10 NOTE — Telephone Encounter (Signed)
Patient came into office today and states that he is now interested in seeing nutritionist.  Please advise.

## 2014-04-11 ENCOUNTER — Other Ambulatory Visit: Payer: Self-pay | Admitting: Family Medicine

## 2014-04-11 DIAGNOSIS — R7301 Impaired fasting glucose: Secondary | ICD-10-CM

## 2014-04-11 DIAGNOSIS — E785 Hyperlipidemia, unspecified: Secondary | ICD-10-CM

## 2014-04-11 NOTE — Telephone Encounter (Signed)
Nutritionist order entered.

## 2014-04-23 ENCOUNTER — Telehealth: Payer: Self-pay | Admitting: Family Medicine

## 2014-04-23 MED ORDER — LISINOPRIL 40 MG PO TABS: 40.0000 mg | ORAL_TABLET | Freq: Every day | ORAL | Status: DC

## 2014-04-23 MED ORDER — DILTIAZEM HCL ER COATED BEADS 180 MG PO CP24: 180.0000 mg | ORAL_CAPSULE | Freq: Every day | ORAL | Status: DC

## 2014-04-23 NOTE — Telephone Encounter (Signed)
Pt needed rfs.  Rx sent.

## 2014-04-23 NOTE — Telephone Encounter (Signed)
Patient has a question about his Rx. Please call him.

## 2014-05-21 ENCOUNTER — Ambulatory Visit: Payer: BC Managed Care – PPO | Admitting: Skilled Nursing Facility1

## 2014-05-28 ENCOUNTER — Encounter: Payer: Self-pay | Admitting: Family Medicine

## 2014-06-21 ENCOUNTER — Encounter: Payer: Self-pay | Admitting: Nurse Practitioner

## 2014-06-21 ENCOUNTER — Ambulatory Visit (INDEPENDENT_AMBULATORY_CARE_PROVIDER_SITE_OTHER): Payer: BLUE CROSS/BLUE SHIELD | Admitting: Nurse Practitioner

## 2014-06-21 VITALS — BP 124/74 | HR 48 | Temp 98.1°F | Ht 74.0 in | Wt 220.0 lb

## 2014-06-21 DIAGNOSIS — M7662 Achilles tendinitis, left leg: Secondary | ICD-10-CM

## 2014-06-21 DIAGNOSIS — H6983 Other specified disorders of Eustachian tube, bilateral: Secondary | ICD-10-CM

## 2014-06-21 MED ORDER — FLUTICASONE PROPIONATE 50 MCG/ACT NA SUSP: 1.0000 | Freq: Two times a day (BID) | NASAL | Status: DC

## 2014-06-21 NOTE — Progress Notes (Signed)
Subjective:     Riley Moore is a 49 y.o. male presents w/c/o L heel pain & "stopped up ears". Heel: reports 3 days pain relieved by 600 mg ibuprophen 3-4 times daily. Knot on back of heel, tender to touch. Started about 3 weeks w/mild transient pain. Thinks new boots & increase in exercise have caused pain & swelling: stopped wearing boots, but is walking about 4 mi daily. Ear: Duration 3 weeks. [facility]en at [facility]-started on prednisone & ABX. Some relief, but ear feels stuffy, no pain. Reports Chronic sinsus issues. Frequent clearing of throat during exam.  The following portions of the patient's history were reviewed and updated as appropriate: allergies, current medications, past medical history, past social history, past surgical history and problem list.  Review of Systems Constitutional: negative for fatigue and fevers Ears, nose, mouth, throat, and face: negative for sore throat Respiratory: negative for cough    Objective:    BP 124/74 mmHg  Pulse 48  Temp(Src) 98.1 F (36.7 C) (Oral)  Ht 6\' 2"  (1.88 m)  Wt 220 lb (99.791 kg)  BMI 28.23 kg/m2  SpO2 98% BP 124/74 mmHg  Pulse 48  Temp(Src) 98.1 F (36.7 C) (Oral)  Ht 6\' 2"  (1.88 m)  Wt 220 lb (99.791 kg)  BMI 28.23 kg/m2  SpO2 98% General appearance: alert, cooperative, appears stated age and no distress Head: Normocephalic, without obvious abnormality, atraumatic Eyes: negative findings: lids and lashes normal and conjunctivae and sclerae normal Ears: Bilat TM retracted, wet appearance. bones visible bilat. Throat: lips, mucosa, and tongue normal; teeth and gums normal Extremities: swelling at achilles insertion site & tender adjacent to nodule, lateral aspect of heel. Tender back of heel w/dorsiflexion    Assessment:Plan  1. Eustachian tube dysfunction, bilateral Start daily sinus rinses for best sinus health Use flonase at least 2 weeks - fluticasone (FLONASE) 50 MCG/ACT nasal spray; Place 1 spray into both nostrils  2 (two) times daily.  Dispense: 16 g; Refill: 1 F/u if no improvement  2. Achilles tendinitis of left lower extremity Stretches discussed & HO given Heat 3 times daily ibuprophen & tylenol twice daily w/food See pt instructions - Ambulatory referral to Orthopedics

## 2014-06-21 NOTE — Patient Instructions (Signed)
Perform exercises below. Hold positions for 10 seconds, repeat 5 times, 2-3 times daily. Use moist heat 3-4 times daily (sock w/rice). Continue ibuprophen 600 mg twice daily with food. You may add 1000 mg tylenol.  See orthopedist for further eval & treatment.  For ears: start flonase-1 spray each nare twice daily for at least 2 weeks. Use 1 week longer than you have symptoms. Start daily sinus rinse- Neilmed sinus rinse-for best sinus hygiene. Use mechanical method to improve eustachian tube function-do as often as you can think of it. Let us know if not feeling better.              Achilles Tendinitis  with Rehab Achilles tendinitis is a disorder of the Achilles tendon. The Achilles tendon connects the large calf muscles (Gastrocnemius and Soleus) to the heel bone (calcaneus). This tendon is sometimes called the heel cord. It is important for pushing-off and standing on your toes and is important for walking, running, or jumping. Tendinitis is often caused by overuse and repetitive microtrauma. SYMPTOMS  Pain, tenderness, swelling, warmth, and redness may occur over the Achilles tendon even at rest.  Pain with pushing off, or flexing or extending the ankle.  Pain that is worsened after or during activity. CAUSES   Overuse sometimes seen with rapid increase in exercise programs or in sports requiring running and jumping.  Poor physical conditioning (strength and flexibility or endurance).  Running sports, especially training running down hills.  Inadequate warm-up before practice or play or failure to stretch before participation.  Injury to the tendon. PREVENTION   Warm up and stretch before practice or competition.  Allow time for adequate rest and recovery between practices and competition.  Keep up conditioning.  Keep up ankle and leg flexibility.  Improve or keep muscle strength and endurance.  Improve cardiovascular fitness.  Use proper technique.  Use proper  equipment (shoes, skates).  To help prevent recurrence, taping, protective strapping, or an adhesive bandage may be recommended for several weeks after healing is complete. PROGNOSIS   Recovery may take weeks to several months to heal.  Longer recovery is expected if symptoms have been prolonged.  Recovery is usually quicker if the inflammation is due to a direct blow as compared with overuse or sudden strain. RELATED COMPLICATIONS   Healing time will be prolonged if the condition is not correctly treated. The injury must be given plenty of time to heal.  Symptoms can reoccur if activity is resumed too soon.  Untreated, tendinitis may increase the risk of tendon rupture requiring additional time for recovery and possibly surgery. TREATMENT   The first treatment consists of rest anti-inflammatory medication, and ice to relieve the pain.  Stretching and strengthening exercises after resolution of pain will likely help reduce the risk of recurrence. Referral to a physical therapist or athletic trainer for further evaluation and treatment may be helpful.  A walking boot or cast may be recommended to rest the Achilles tendon. This can help break the cycle of inflammation and microtrauma.  Arch supports (orthotics) may be prescribed or recommended by your caregiver as an adjunct to therapy and rest.  Surgery to remove the inflamed tendon lining or degenerated tendon tissue is rarely necessary and has shown less than predictable results. MEDICATION   Nonsteroidal anti-inflammatory medications, such as aspirin and ibuprofen, may be used for pain and inflammation relief. Do not take within 7 days before surgery. Take these as directed by your caregiver. Contact your caregiver immediately if any  bleeding, stomach upset, or signs of allergic reaction occur. Other minor pain relievers, such as acetaminophen, may also be used.  Pain relievers may be prescribed as necessary by your caregiver. Do  not take prescription pain medication for longer than 4 to 7 days. Use only as directed and only as much as you need.  Cortisone injections are rarely indicated. Cortisone injections may weaken tendons and predispose to rupture. It is better to give the condition more time to heal than to use them. HEAT AND COLD  Cold is used to relieve pain and reduce inflammation for acute and chronic Achilles tendinitis. Cold should be applied for 10 to 15 minutes every 2 to 3 hours for inflammation and pain and immediately after any activity that aggravates your symptoms. Use ice packs or an ice massage.  Heat may be used before performing stretching and strengthening activities prescribed by your caregiver. Use a heat pack or a warm soak. SEEK MEDICAL CARE IF:  Symptoms get worse or do not improve in 2 weeks despite treatment.  New, unexplained symptoms develop. Drugs used in treatment may produce side effects. EXERCISES RANGE OF MOTION (ROM) AND STRETCHING EXERCISES - Achilles Tendinitis  These exercises may help you when beginning to rehabilitate your injury. Your symptoms may resolve with or without further involvement from your physician, physical therapist or athletic trainer. While completing these exercises, remember:   Restoring tissue flexibility helps normal motion to return to the joints. This allows healthier, less painful movement and activity.  An effective stretch should be held for at least 30 seconds.  A stretch should never be painful. You should only feel a gentle lengthening or release in the stretched tissue. STRETCH - Gastroc, Standing   Place hands on wall.  Extend right / left leg, keeping the front knee somewhat bent.  Slightly point your toes inward on your back foot.  Keeping your right / left heel on the floor and your knee straight, shift your weight toward the wall, not allowing your back to arch.  You should feel a gentle stretch in the right / left calf. Hold this  position for __________ seconds. Repeat __________ times. Complete this stretch __________ times per day. STRETCH - Soleus, Standing   Place hands on wall.  Extend right / left leg, keeping the other knee somewhat bent.  Slightly point your toes inward on your back foot.  Keep your right / left heel on the floor, bend your back knee, and slightly shift your weight over the back leg so that you feel a gentle stretch deep in your back calf.  Hold this position for __________ seconds. Repeat __________ times. Complete this stretch __________ times per day. STRETCH - Gastrocsoleus, Standing  Note: This exercise can place a lot of stress on your foot and ankle. Please complete this exercise only if specifically instructed by your caregiver.   Place the ball of your right / left foot on a step, keeping your other foot firmly on the same step.  Hold on to the wall or a rail for balance.  Slowly lift your other foot, allowing your body weight to press your heel down over the edge of the step.  You should feel a stretch in your right / left calf.  Hold this position for __________ seconds.  Repeat this exercise with a slight bend in your knee. Repeat __________ times. Complete this stretch __________ times per day.  STRENGTHENING EXERCISES - Achilles Tendinitis These exercises may help you when beginning  to rehabilitate your injury. They may resolve your symptoms with or without further involvement from your physician, physical therapist or athletic trainer. While completing these exercises, remember:   Muscles can gain both the endurance and the strength needed for everyday activities through controlled exercises.  Complete these exercises as instructed by your physician, physical therapist or athletic trainer. Progress the resistance and repetitions only as guided.  You may experience muscle soreness or fatigue, but the pain or discomfort you are trying to eliminate should never worsen  during these exercises. If this pain does worsen, stop and make certain you are following the directions exactly. If the pain is still present after adjustments, discontinue the exercise until you can discuss the trouble with your clinician. STRENGTH - Plantar-flexors   Sit with your right / left leg extended. Holding onto both ends of a rubber exercise band/tubing, loop it around the ball of your foot. Keep a slight tension in the band.  Slowly push your toes away from you, pointing them downward.  Hold this position for __________ seconds. Return slowly, controlling the tension in the band/tubing. Repeat __________ times. Complete this exercise __________ times per day.  STRENGTH - Plantar-flexors   Stand with your feet shoulder width apart. Steady yourself with a wall or table using as little support as needed.  Keeping your weight evenly spread over the width of your feet, rise up on your toes.*  Hold this position for __________ seconds. Repeat __________ times. Complete this exercise __________ times per day.  *If this is too easy, shift your weight toward your right / left leg until you feel challenged. Ultimately, you may be asked to do this exercise with your right / left foot only. STRENGTH - Plantar-flexors, Eccentric  Note: This exercise can place a lot of stress on your foot and ankle. Please complete this exercise only if specifically instructed by your caregiver.   Place the balls of your feet on a step. With your hands, use only enough support from a wall or rail to keep your balance.  Keep your knees straight and rise up on your toes.  Slowly shift your weight entirely to your right / left toes and pick up your opposite foot. Gently and with controlled movement, lower your weight through your right / left foot so that your heel drops below the level of the step. You will feel a slight stretch in the back of your calf at the end position.  Use the healthy leg to help rise  up onto the balls of both feet, then lower weight only on the right / left leg again. Build up to 15 repetitions. Then progress to 3 consecutive sets of 15 repetitions.*  After completing the above exercise, complete the same exercise with a slight knee bend (about 30 degrees). Again, build up to 15 repetitions. Then progress to 3 consecutive sets of 15 repetitions.* Perform this exercise __________ times per day.  *When you easily complete 3 sets of 15, your physician, physical therapist or athletic trainer may advise you to add resistance by wearing a backpack filled with additional weight. STRENGTH - Plantar Flexors, Seated   Sit on a chair that allows your feet to rest flat on the ground. If necessary, sit at the edge of the chair.  Keeping your toes firmly on the ground, lift your right / left heel as far as you can without increasing any discomfort in your ankle. Repeat __________ times. Complete this exercise __________ times a day. *If  instructed by your physician, physical therapist or athletic trainer, you may add ____________________ of resistance by placing a weighted object on your right / left knee. Document Released: 11/19/2004 Document Revised: 07/13/2011 Document Reviewed: 08/02/2008 [facility] Patient Information 2015 [place], [place]. This information is not intended to replace advice given to you by your health care provider. Make sure you discuss any questions you have with your health care provider.

## 2014-06-21 NOTE — Progress Notes (Signed)
Pre visit review using our clinic review tool, if applicable. No additional management support is needed unless otherwise documented below in the visit note. 

## 2014-08-16 ENCOUNTER — Other Ambulatory Visit: Payer: Self-pay | Admitting: Family Medicine

## 2014-08-16 MED ORDER — LISINOPRIL 40 MG PO TABS: 40.0000 mg | ORAL_TABLET | Freq: Every day | ORAL | Status: DC

## 2014-08-16 MED ORDER — DILTIAZEM HCL ER COATED BEADS 180 MG PO CP24: 180.0000 mg | ORAL_CAPSULE | Freq: Every day | ORAL | Status: DC

## 2014-10-25 ENCOUNTER — Encounter: Payer: Self-pay | Admitting: Nurse Practitioner

## 2014-10-25 ENCOUNTER — Ambulatory Visit (INDEPENDENT_AMBULATORY_CARE_PROVIDER_SITE_OTHER): Payer: BLUE CROSS/BLUE SHIELD | Admitting: Nurse Practitioner

## 2014-10-25 VITALS — BP 148/100 | HR 48 | Temp 97.5°F | Resp 18 | Ht 74.0 in | Wt 222.0 lb

## 2014-10-25 DIAGNOSIS — M13172 Monoarthritis, not elsewhere classified, left ankle and foot: Secondary | ICD-10-CM | POA: Diagnosis not present

## 2014-10-25 LAB — COMPREHENSIVE METABOLIC PANEL
ALBUMIN: 4.5 g/dL (ref 3.5–5.2)
ALK PHOS: 52 U/L (ref 39–117)
ALT: 62 U/L — ABNORMAL HIGH (ref 0–53)
AST: 46 U/L — ABNORMAL HIGH (ref 0–37)
BUN: 13 mg/dL (ref 6–23)
CO2: 30 meq/L (ref 19–32)
Calcium: 9.3 mg/dL (ref 8.4–10.5)
Chloride: 100 mEq/L (ref 96–112)
Creatinine, Ser: 0.9 mg/dL (ref 0.40–1.50)
GFR: 94.87 mL/min (ref >60.00)
GLUCOSE: 80 mg/dL (ref 70–99)
POTASSIUM: 4.4 meq/L (ref 3.5–5.1)
Sodium: 136 mEq/L (ref 135–145)
TOTAL PROTEIN: 7.2 g/dL (ref 6.0–8.3)
Total Bilirubin: 0.9 mg/dL (ref 0.2–1.2)

## 2014-10-25 LAB — CBC
HCT: 46.4 % (ref 39.0–52.0)
HEMOGLOBIN: 15.6 g/dL (ref 13.0–17.0)
MCHC: 33.5 g/dL (ref 30.0–36.0)
MCV: 91.1 fl (ref 78.0–100.0)
Platelets: 209 10*3/uL (ref 150.0–400.0)
RBC: 5.09 Mil/uL (ref 4.22–5.81)
RDW: 13.1 % (ref 11.5–15.5)
WBC: 9.4 10*3/uL (ref 4.0–10.5)

## 2014-10-25 LAB — SEDIMENTATION RATE: SED RATE: 31 mm/h — AB (ref 0–22)

## 2014-10-25 LAB — URIC ACID: Uric Acid, Serum: 7.2 mg/dL (ref 4.0–7.8)

## 2014-10-25 LAB — RHEUMATOID FACTOR

## 2014-10-25 NOTE — Patient Instructions (Addendum)
I think this is gout. Labs will help determine cause of pain.  Start 800 mg ibuprophen 3 times daily for 3 days with food, then cut back to twice daily with food for 3 days, then 600 mg twice daily with food for 3 days, then once daily with food until we see you again.  Increase water and milk intake, decrease alcohol intake. Avoid foods with purine: red meats, beans, shellfish.  Please return in 2 weeks.  Gout Gout is an inflammatory arthritis caused by a buildup of uric acid crystals in the joints. Uric acid is a chemical that is normally present in the blood. When the level of uric acid in the blood is too high it can form crystals that deposit in your joints and tissues. This causes joint redness, soreness, and swelling (inflammation). Repeat attacks are common. Over time, uric acid crystals can form into masses (tophi) near a joint, destroying bone and causing disfigurement. Gout is treatable and often preventable. CAUSES  The disease begins with elevated levels of uric acid in the blood. Uric acid is produced by your body when it breaks down a naturally found substance called purines. Certain foods you eat, such as meats and fish, contain high amounts of purines. Causes of an elevated uric acid level include:  Being passed down from parent to child (heredity).  Diseases that cause increased uric acid production (such as obesity, psoriasis, and certain cancers).  Excessive alcohol use.  Diet, especially diets rich in meat and seafood.  Medicines, including certain cancer-fighting medicines (chemotherapy), water pills (diuretics), and aspirin.  Chronic kidney disease. The kidneys are no longer able to remove uric acid well.  Problems with metabolism. Conditions strongly associated with gout include:  Obesity.  High blood pressure.  High cholesterol.  Diabetes. Not everyone with elevated uric acid levels gets gout. It is not understood why some people get gout and others do  not. Surgery, joint injury, and eating too much of certain foods are some of the factors that can lead to gout attacks. SYMPTOMS   An attack of gout comes on quickly. It causes intense pain with redness, swelling, and warmth in a joint.  Fever can occur.  Often, only one joint is involved. Certain joints are more commonly involved:  Base of the big toe.  Knee.  Ankle.  Wrist.  Finger. Without treatment, an attack usually goes away in a few days to weeks. Between attacks, you usually will not have symptoms, which is different from many other forms of arthritis. DIAGNOSIS  Your caregiver will suspect gout based on your symptoms and exam. In some cases, tests may be recommended. The tests may include:  Blood tests.  Urine tests.  X-rays.  Joint fluid exam. This exam requires a needle to remove fluid from the joint (arthrocentesis). Using a microscope, gout is confirmed when uric acid crystals are seen in the joint fluid. TREATMENT  There are two phases to gout treatment: treating the sudden onset (acute) attack and preventing attacks (prophylaxis).  Treatment of an Acute Attack.  Medicines are used. These include anti-inflammatory medicines or steroid medicines.  An injection of steroid medicine into the affected joint is sometimes necessary.  The painful joint is rested. Movement can worsen the arthritis.  You may use warm or cold treatments on painful joints, depending which works best for you.  Treatment to Prevent Attacks.  If you suffer from frequent gout attacks, your caregiver may advise preventive medicine. These medicines are started after the acute  attack subsides. These medicines either help your kidneys eliminate uric acid from your body or decrease your uric acid production. You may need to stay on these medicines for a very long time.  The early phase of treatment with preventive medicine can be associated with an increase in acute gout attacks. For this  reason, during the first few months of treatment, your caregiver may also advise you to take medicines usually used for acute gout treatment. Be sure you understand your caregiver's directions. Your caregiver may make several adjustments to your medicine dose before these medicines are effective.  Discuss dietary treatment with your caregiver or dietitian. Alcohol and drinks high in sugar and fructose and foods such as meat, poultry, and seafood can increase uric acid levels. Your caregiver or dietitian can advise you on drinks and foods that should be limited. HOME CARE INSTRUCTIONS   Do not take aspirin to relieve pain. This raises uric acid levels.  Only take over-the-counter or prescription medicines for pain, discomfort, or fever as directed by your caregiver.  Rest the joint as much as possible. When in bed, keep sheets and blankets off painful areas.  Keep the affected joint raised (elevated).  Apply warm or cold treatments to painful joints. Use of warm or cold treatments depends on which works best for you.  Use crutches if the painful joint is in your leg.  Drink enough fluids to keep your urine clear or pale yellow. This helps your body get rid of uric acid. Limit alcohol, sugary drinks, and fructose drinks.  Follow your dietary instructions. Pay careful attention to the amount of protein you eat. Your daily diet should emphasize fruits, vegetables, whole grains, and fat-free or low-fat milk products. Discuss the use of coffee, vitamin C, and cherries with your caregiver or dietitian. These may be helpful in lowering uric acid levels.  Maintain a healthy body weight. SEEK MEDICAL CARE IF:   You develop diarrhea, vomiting, or any side effects from medicines.  You do not feel better in 24 hours, or you are getting worse. SEEK IMMEDIATE MEDICAL CARE IF:   Your joint becomes suddenly more tender, and you have chills or a fever. MAKE SURE YOU:   Understand these  instructions.  Will watch your condition.  Will get help right away if you are not doing well or get worse. Document Released: 04/17/2000 Document Revised: 09/04/2013 Document Reviewed: 12/02/2011 [facility] Patient Information 2015 [place], [place]. This information is not intended to replace advice given to you by your health care provider. Make sure you discuss any questions you have with your health care provider.

## 2014-10-25 NOTE — Progress Notes (Signed)
Subjective:     Riley Moore is a 49 y.o. male who presents with possible gout. Pain is located in the left MTP(s): 1st, and has been present for 1 week. Pain is described as aching, shooting and stabbing, and is constant, severe without ibuprophen, "tolerable" with it. He is taking about 10 tablets ibuprophen daily-3 in am, 2 or 3 T twice more. Associated symptoms: erythema, tenderness, warmth and swelling. Related to injury: no. He reports 2 previous episodes-last was "years" ago. He denies fever & pain in other joints. He denies stomach pain, indigestion. No Hx of gastric ulcers. He was seen a few mos ago for achilles tendonitis in same foot. He did not f/u w/ortho as recommended. Pain resolved.  The following portions of the patient's history were reviewed and updated as appropriate: allergies, current medications, past medical history, past social history, past surgical history and problem list.  Review of Systems Pertinent items are noted in HPI.    Objective:    BP 148/100 mmHg  Pulse 48  Temp(Src) 97.5 F (36.4 C) (Temporal)  Resp 18  Ht 6\' 2"  (1.88 m)  Wt 222 lb (100.699 kg)  BMI 28.49 kg/m2  SpO2 99% General appearance: alert, cooperative, appears stated age and no distress Eyes: negative findings: lids and lashes normal and conjunctivae and sclerae normal Neurologic: Gait: limping due to foot pain MSK: R foot nml. L foot: diffuse swelling & erythema. Tender & warmth at 1st MTP joint       Assessment:Plan   1. 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 F/u 2 weeks i

## 2014-10-25 NOTE — Progress Notes (Signed)
Pre visit review using our clinic review tool, if applicable. No additional management support is needed unless otherwise documented below in the visit note. 

## 2014-10-26 LAB — ANTINUCLEAR ANTIBODIES, IFA: ANA TITER 1: NEGATIVE

## 2014-10-26 LAB — CYCLIC CITRUL PEPTIDE ANTIBODY, IGG: Cyclic Citrullin Peptide Ab: <2 U/mL (ref 0.0–5.0)

## 2014-10-29 ENCOUNTER — Telehealth: Payer: Self-pay | Admitting: Nurse Practitioner

## 2014-10-29 NOTE — Telephone Encounter (Signed)
Pt aware.

## 2014-10-29 NOTE — Telephone Encounter (Signed)
pls call pt: Advise Labs indicate, foot pain is most likely due to gout. No change in treatment.

## 2014-10-30 ENCOUNTER — Other Ambulatory Visit (INDEPENDENT_AMBULATORY_CARE_PROVIDER_SITE_OTHER): Payer: BLUE CROSS/BLUE SHIELD

## 2014-10-30 DIAGNOSIS — R7989 Other specified abnormal findings of blood chemistry: Secondary | ICD-10-CM

## 2014-10-30 DIAGNOSIS — Z0000 Encounter for general adult medical examination without abnormal findings: Secondary | ICD-10-CM

## 2014-10-30 LAB — LIPID PANEL
CHOL/HDL RATIO: 7
Cholesterol: 211 mg/dL — ABNORMAL HIGH (ref 0–200)
HDL: 29.4 mg/dL — ABNORMAL LOW (ref >39.00)
NonHDL: 181.6
Triglycerides: 390 mg/dL — ABNORMAL HIGH (ref 0.0–149.0)
VLDL: 78 mg/dL — ABNORMAL HIGH (ref 0.0–40.0)

## 2014-10-30 LAB — PSA: PSA: 0.33 ng/mL (ref 0.10–4.00)

## 2014-10-30 LAB — LDL CHOLESTEROL, DIRECT: Direct LDL: 114 mg/dL

## 2014-10-30 LAB — TSH: TSH: 3.61 u[IU]/mL (ref 0.35–4.50)

## 2014-11-01 ENCOUNTER — Other Ambulatory Visit: Payer: BLUE CROSS/BLUE SHIELD

## 2014-11-02 ENCOUNTER — Ambulatory Visit (INDEPENDENT_AMBULATORY_CARE_PROVIDER_SITE_OTHER): Payer: BLUE CROSS/BLUE SHIELD | Admitting: Family Medicine

## 2014-11-02 ENCOUNTER — Encounter: Payer: Self-pay | Admitting: Family Medicine

## 2014-11-02 VITALS — BP 132/84 | HR 59 | Temp 98.1°F | Resp 16 | Ht 74.0 in | Wt 219.0 lb

## 2014-11-02 DIAGNOSIS — Z23 Encounter for immunization: Secondary | ICD-10-CM | POA: Diagnosis not present

## 2014-11-02 DIAGNOSIS — Z0000 Encounter for general adult medical examination without abnormal findings: Secondary | ICD-10-CM | POA: Diagnosis not present

## 2014-11-02 DIAGNOSIS — R740 Nonspecific elevation of levels of transaminase and lactic acid dehydrogenase [LDH]: Secondary | ICD-10-CM

## 2014-11-02 DIAGNOSIS — F1020 Alcohol dependence, uncomplicated: Secondary | ICD-10-CM

## 2014-11-02 DIAGNOSIS — Z1211 Encounter for screening for malignant neoplasm of colon: Secondary | ICD-10-CM

## 2014-11-02 DIAGNOSIS — L819 Disorder of pigmentation, unspecified: Secondary | ICD-10-CM

## 2014-11-02 DIAGNOSIS — E781 Pure hyperglyceridemia: Secondary | ICD-10-CM

## 2014-11-02 DIAGNOSIS — R7401 Elevation of levels of liver transaminase levels: Secondary | ICD-10-CM

## 2014-11-02 MED ORDER — LORAZEPAM 0.5 MG PO TABS: ORAL_TABLET | ORAL | Status: DC

## 2014-11-02 NOTE — Progress Notes (Signed)
Pre visit review using our clinic review tool, if applicable. No additional management support is needed unless otherwise documented below in the visit note. 

## 2014-11-02 NOTE — Progress Notes (Signed)
Office Note 11/02/2014  CC:  Chief Complaint  Patient presents with  . Annual Exam    HPI:  Riley Moore is a 49 y.o. White male who is here for annual CPE. We reviewed his recent labs in detail during o/v today: Trigs in upper 300s, HDL 29, AST/ALT mildly elevated but improved compared to 8 mo ago.  All other labs normal, including PSA (see below).   He continues to drink 4 beers per day, again says that he occasionally tries to quit but after about 36h passes since his last beer he begins to get panic attacks: brief periods of SOB, dizziness, chest heaviness, sweating, feels like he's going to die, peaks in 5-10 min, lasts 30 min total.  Occurs 3-5 times per day until he starts to drink beer again.  Denies hx of w/drawal seizure or actual DTs in the past.  Denies past hx of binge drinking or ever drinking much more volume of alcohol than he currently does.  Has never had his drinking affect his job or any probs with the law b/c of drinking.  No hx of drug abuse, no FH of alcoholism/addiction. He wants to quit drinking and does not want to go to a rehab center.    His prior MD's treated these panic attacks with SSRIs or SNRI's and pt says he always felt worse on these meds and didn't take them long.    Past Medical History  Diagnosis Date  . Hypertension   . Anxiety attack   . Hyperlipemia     Intolerant of statins: tolerates welchol  . Perennial allergic rhinitis   . Atrial flutter 2013    Dr. Percival Spanish  . Elevated transaminase level     suspect combo of hyperlip + alcohol  . Impaired fasting glucose 03/2014    A1c 5.9%   . Metabolic syndrome     IFG, low HDL, elev trigs: nutritionist referral 04/2014 but patient did not show for appointment.    Past Surgical History  Procedure Laterality Date  . None      Family History  Problem Relation Age of Onset  . Heart disease Father     pacemaker, atrial fibrillation  . Hypertension Father     History   Social History  .  Marital Status: Single    Spouse Name: N/A  . Number of Children: 2  . Years of Education: N/A   Occupational History  . Chief Financial Officer    Social History Main Topics  . Smoking status: Never Smoker   . Smokeless tobacco: Never Used  . Alcohol Use: 2.4 - 3.0 oz/week    4-5 Cans of beer per week     Comment: daily  . Drug Use: No  . Sexual Activity: Not on file   Other Topics Concern  . Not on file   Social History Narrative   Lives at home with wife and two children.     Occupation: Chief Financial Officer.   No tob or drugs.  Alc: 4 beers per day.  Denies hx of alc problems/abuse.  However, he says he tried quitting in the past and after a few days he started to have "panic attacks".   MEDS: not taking clonazepam listed below Outpatient Prescriptions Prior to Visit  Medication Sig Dispense Refill  . diltiazem (CARDIZEM CD) 180 MG 24 hr capsule Take 1 capsule (180 mg total) by mouth daily. 30 capsule 3  . fluticasone (FLONASE) 50 MCG/ACT nasal spray Place 1 spray into both nostrils  2 (two) times daily. 16 g 1  . lisinopril (PRINIVIL,ZESTRIL) 40 MG tablet Take 1 tablet (40 mg total) by mouth daily. 30 tablet 3  . metoprolol (LOPRESSOR) 50 MG tablet Take 1 tablet (50 mg total) by mouth 2 (two) times daily. 60 tablet 11  . cetirizine (ZYRTEC) 10 MG tablet Take 10 mg by mouth daily.    . clonazePAM (KLONOPIN) 0.5 MG tablet Take 0.5 mg by mouth as needed for anxiety.     No facility-administered medications prior to visit.    No Known Allergies  ROS Review of Systems  Constitutional: Negative for fever, chills, appetite change and fatigue.  HENT: Negative for congestion, dental problem, ear pain and sore throat.   Eyes: Negative for discharge, redness and visual disturbance.  Respiratory: Negative for cough, chest tightness, shortness of breath and wheezing.   Cardiovascular: Negative for chest pain, palpitations and leg swelling.  Gastrointestinal: Negative for nausea,  vomiting, abdominal pain, diarrhea and blood in stool.  Genitourinary: Negative for dysuria, urgency, frequency, hematuria, flank pain and difficulty urinating.  Musculoskeletal: Negative for myalgias, back pain, joint swelling, arthralgias and neck stiffness.  Skin: Negative for pallor and rash.  Neurological: Negative for dizziness, speech difficulty, weakness and headaches.  Hematological: Negative for adenopathy. Does not bruise/bleed easily.  Psychiatric/Behavioral: Negative for confusion and sleep disturbance. The patient is nervous/anxious (see HPI).     PE; Blood pressure 132/84, pulse 59, temperature 98.1 F (36.7 C), temperature source Oral, resp. rate 16, height 6\' 2"  (1.88 m), weight 219 lb (99.338 kg), SpO2 98 %. Gen: Alert, well appearing.  Patient is oriented to person, place, time, and situation. AFFECT: pleasant, lucid thought and speech. ENT: Ears: EACs clear, normal epithelium.  TMs with good light reflex and landmarks bilaterally.  Eyes: no injection, icteris, swelling, or exudate.  EOMI, PERRLA. Nose: no drainage or turbinate edema/swelling.  No injection or focal lesion.  Mouth: lips without lesion/swelling.  Oral mucosa pink and moist.  Dentition intact and without obvious caries or gingival swelling.  Oropharynx without erythema, exudate, or swelling.  Neck: supple/nontender.  No LAD, mass, or TM.  Carotid pulses 2+ bilaterally, without bruits. CV: RRR, no m/r/g.   LUNGS: CTA bilat, nonlabored resps, good aeration in all lung fields. ABD: soft, NT, ND, BS normal.  No hepatospenomegaly or mass.  No bruits. EXT: no clubbing, cyanosis, or edema.  Musculoskeletal: no joint swelling, erythema, warmth, or tenderness.  ROM of all joints intact. Skin - no sores or rashes or color changes.  He has several lentiginous lesions on his back, irregular-shaped. One in particular has salt and pepper type pigmentation characteristics, appears atypical.  Shape is oval, borders regular   This one measures 5 mm L to R and 3 mm top to bottom.  This is not a palpable lesion. Rectal exam: negative without mass, lesions or tenderness, PROSTATE EXAM: smooth and symmetric without nodules or tenderness.  Pertinent labs:  Lab Results  Component Value Date   TSH 3.61 10/30/2014   Lab Results  Component Value Date   WBC 9.4 10/25/2014   HGB 15.6 10/25/2014   HCT 46.4 10/25/2014   MCV 91.1 10/25/2014   PLT 209.0 10/25/2014   Lab Results  Component Value Date   CREATININE 0.90 10/25/2014   BUN 13 10/25/2014   NA 136 10/25/2014   K 4.4 10/25/2014   CL 100 10/25/2014   CO2 30 10/25/2014   Lab Results  Component Value Date   ALT 62* 10/25/2014  AST 46* 10/25/2014   ALKPHOS 52 10/25/2014   BILITOT 0.9 10/25/2014   Lab Results  Component Value Date   CHOL 211* 10/30/2014   Lab Results  Component Value Date   HDL 29.40* 10/30/2014   No results found for: [facility] Lab Results  Component Value Date   TRIG 390.0* 10/30/2014   Lab Results  Component Value Date   CHOLHDL 7 10/30/2014   Lab Results  Component Value Date   PSA 0.33 10/30/2014   ASSESSMENT AND PLAN:   1) Alcohol dependence, his w/drawal leads to panic symptoms:  He'll quit cold-turkey and start lorazepam taper: 0.5 mg tid x 7d, then 0.5mg  bid x 7d, then 0.5mg  qd x 7d, then 1/2 tab qd x 6d, then stop. Hopefully quitting will help his elevated transaminases and his triglycerides.  2) Atypical pigmented lesion on back: return sometime in the next 3 mo for excision/send to path.  3) Health maintenance exam:  Reviewed age and gender appropriate health maintenance issues (prudent diet, regular exercise, health risks of tobacco and excessive alcohol, use of seatbelts, fire alarms in home, use of sunscreen).  Also reviewed age and gender appropriate health screening as well as vaccine recommendations. Tdap today. Refer to GI for colon cancer screening. DRE today normal, as was recent PSA.  An After  Visit Summary was printed and given to the patient.  FOLLOW UP:  Return for Return sometime in the next 3 mo for 30 min procedure appt to remove a skin lesion on your back.

## 2014-12-17 ENCOUNTER — Other Ambulatory Visit: Payer: Self-pay | Admitting: Geriatric Medicine

## 2014-12-17 MED ORDER — DILTIAZEM HCL ER COATED BEADS 180 MG PO CP24: 180.0000 mg | ORAL_CAPSULE | Freq: Every day | ORAL | Status: DC

## 2014-12-17 MED ORDER — LISINOPRIL 40 MG PO TABS: 40.0000 mg | ORAL_TABLET | Freq: Every day | ORAL | Status: DC

## 2015-03-19 ENCOUNTER — Other Ambulatory Visit: Payer: Self-pay | Admitting: *Deleted

## 2015-03-19 MED ORDER — METOPROLOL TARTRATE 50 MG PO TABS: 50.0000 mg | ORAL_TABLET | Freq: Two times a day (BID) | ORAL | Status: DC

## 2015-03-19 NOTE — Telephone Encounter (Signed)
RF request for metoprolol LOV: 11/02/14 Next ov: None Last written: 03/22/14 #60 w/ 11RF

## 2015-04-19 ENCOUNTER — Other Ambulatory Visit: Payer: Self-pay | Admitting: *Deleted

## 2015-04-19 MED ORDER — LISINOPRIL 40 MG PO TABS: 40.0000 mg | ORAL_TABLET | Freq: Every day | ORAL | Status: DC

## 2015-04-19 MED ORDER — DILTIAZEM HCL ER COATED BEADS 180 MG PO CP24: 180.0000 mg | ORAL_CAPSULE | Freq: Every day | ORAL | Status: DC

## 2015-04-19 NOTE — Telephone Encounter (Signed)
LOV: 11/02/14 NOV: None  RF request for lisinopril Last written: 12/17/14 #30 w/ 3RF  RF request for diltiazem Last written: 12/17/14 #30 w/ 3RF

## 2015-04-24 ENCOUNTER — Telehealth: Payer: Self-pay | Admitting: *Deleted

## 2015-04-24 MED ORDER — CLOTRIMAZOLE-BETAMETHASONE 1-0.05 % EX CREA: 1.0000 "application " | TOPICAL_CREAM | Freq: Two times a day (BID) | CUTANEOUS | Status: DC

## 2015-04-24 NOTE — Telephone Encounter (Signed)
OK to fill this med as previously prescribed x 3 rFs.-thx

## 2015-04-24 NOTE — Telephone Encounter (Signed)
Refill sent as directed

## 2015-04-24 NOTE — Telephone Encounter (Signed)
Fax from [facility] requesting refill for Clotrimazole/Betameth Cream. This medication is no longer an active medication. Please advise. Thanks.   RF request for clotrimazole/betameth cream LOV: 11/02/14 Next ov: None Last written: 03/22/14 45g w/ 3RF

## 2015-07-25 ENCOUNTER — Encounter (HOSPITAL_COMMUNITY): Payer: Self-pay | Admitting: Emergency Medicine

## 2015-07-25 ENCOUNTER — Emergency Department (HOSPITAL_COMMUNITY)
Admission: EM | Admit: 2015-07-25 | Discharge: 2015-07-25 | Disposition: A | Discharge status: Home / Self Care | Payer: 59 | Attending: Emergency Medicine | Admitting: Emergency Medicine

## 2015-07-25 ENCOUNTER — Emergency Department (HOSPITAL_COMMUNITY): Payer: 59

## 2015-07-25 DIAGNOSIS — R42 Dizziness and giddiness: Secondary | ICD-10-CM | POA: Diagnosis present

## 2015-07-25 DIAGNOSIS — Z8709 Personal history of other diseases of the respiratory system: Secondary | ICD-10-CM | POA: Diagnosis not present

## 2015-07-25 DIAGNOSIS — Z79899 Other long term (current) drug therapy: Secondary | ICD-10-CM | POA: Diagnosis not present

## 2015-07-25 DIAGNOSIS — F419 Anxiety disorder, unspecified: Secondary | ICD-10-CM | POA: Insufficient documentation

## 2015-07-25 DIAGNOSIS — I4892 Unspecified atrial flutter: Secondary | ICD-10-CM | POA: Insufficient documentation

## 2015-07-25 DIAGNOSIS — Z8639 Personal history of other endocrine, nutritional and metabolic disease: Secondary | ICD-10-CM | POA: Insufficient documentation

## 2015-07-25 DIAGNOSIS — Z7952 Long term (current) use of systemic steroids: Secondary | ICD-10-CM | POA: Insufficient documentation

## 2015-07-25 DIAGNOSIS — I10 Essential (primary) hypertension: Secondary | ICD-10-CM | POA: Diagnosis not present

## 2015-07-25 LAB — BASIC METABOLIC PANEL
ANION GAP: 11 (ref 5–15)
BUN: 19 mg/dL (ref 6–20)
CALCIUM: 9.1 mg/dL (ref 8.9–10.3)
CO2: 23 mmol/L (ref 22–32)
Chloride: 101 mmol/L (ref 101–111)
Creatinine, Ser: 0.83 mg/dL (ref 0.61–1.24)
GFR calc Af Amer: >60 mL/min (ref >60)
GLUCOSE: 172 mg/dL — AB (ref 65–99)
Potassium: 4.6 mmol/L (ref 3.5–5.1)
SODIUM: 135 mmol/L (ref 135–145)

## 2015-07-25 LAB — CBC WITH DIFFERENTIAL/PLATELET
BASOS ABS: 0 10*3/uL (ref 0.0–0.1)
BASOS PCT: 0 %
EOS ABS: 0 10*3/uL (ref 0.0–0.7)
EOS PCT: 0 %
HCT: 44.3 % (ref 39.0–52.0)
Hemoglobin: 16 g/dL (ref 13.0–17.0)
Lymphocytes Relative: 14 %
Lymphs Abs: 1 10*3/uL (ref 0.7–4.0)
MCH: 31.4 pg (ref 26.0–34.0)
MCHC: 36.1 g/dL — ABNORMAL HIGH (ref 30.0–36.0)
MCV: 86.9 fL (ref 78.0–100.0)
MONO ABS: 0.1 10*3/uL (ref 0.1–1.0)
Monocytes Relative: 1 %
Neutro Abs: 6.6 10*3/uL (ref 1.7–7.7)
Neutrophils Relative %: 85 %
PLATELETS: 175 10*3/uL (ref 150–400)
RBC: 5.1 MIL/uL (ref 4.22–5.81)
RDW: 12.5 % (ref 11.5–15.5)
WBC: 7.7 10*3/uL (ref 4.0–10.5)

## 2015-07-25 MED ORDER — MECLIZINE HCL 25 MG PO TABS
25.0000 mg | ORAL_TABLET | Freq: Once | ORAL | Status: AC
  Administered 2015-07-25: 25 mg via ORAL
  Filled 2015-07-25: qty 1

## 2015-07-25 MED ORDER — MECLIZINE HCL 25 MG PO TABS: 25.0000 mg | ORAL_TABLET | Freq: Three times a day (TID) | ORAL | Status: DC | PRN

## 2015-07-25 MED ORDER — HYDROGEN PEROXIDE 3 % EX SOLN
CUTANEOUS | Status: AC
  Administered 2015-07-25: 22:00:00
  Filled 2015-07-25: qty 473

## 2015-07-25 NOTE — Discharge Instructions (Signed)

## 2015-07-25 NOTE — ED Notes (Signed)
EKG given to EDP,Little,MD., for review. 

## 2015-07-25 NOTE — ED Notes (Signed)
Pt actively eating [place] sandwich and drinking water.

## 2015-07-25 NOTE — ED Provider Notes (Signed)
CSN: [id number]     Arrival date & time 07/25/15  1700 History   First MD Initiated Contact with Patient 07/25/15 1929     Chief Complaint  Patient presents with  . Dizziness     (Consider location/radiation/quality/duration/timing/severity/associated sxs/prior Treatment) HPI Comments: 50 year old male with history of hypertension, atrial flutter, metabolic syndrome who presents with dizziness. Yesterday around 5 PM, he had a sudden onset of dizziness which he describes as the sensation of motion when he was not moving. He denies any sudden head movement, sitting up, or other motion prior to the onset of his symptoms. He woke up today and later began having problems with dizziness again. It has persisted throughout the day, constant with associated nausea. He states that he is barely able to open his eyes because of the dizziness. He went to an urgent care earlier today, where he was noted to have cerumen impaction bilaterally and was treated with prednisone and antibiotics for possible ear infection. His dizziness got worse after he left which is why he presents now. He denies any headache, fevers, neck pain, extremity weakness/numbness, or problems walking. He states that he did have an upper respiratory infection last week but the symptoms have improved. He has chronic tinnitus which has not changed recently. No visual problems.  Patient is a 50 y.o. male presenting with dizziness. The history is provided by the patient.  Dizziness   Past Medical History  Diagnosis Date  . Hypertension   . Anxiety attack   . Hyperlipemia     Intolerant of statins: tolerates welchol  . Perennial allergic rhinitis   . Atrial flutter ([place]) 2013    Dr. Percival Spanish  . Elevated transaminase level     suspect combo of hyperlip + alcohol  . Impaired fasting glucose 03/2014    A1c 5.9%   . Metabolic syndrome     IFG, low HDL, elev trigs: nutritionist referral 04/2014 but patient did not show for appointment.    Past Surgical History  Procedure Laterality Date  . None     Family History  Problem Relation Age of Onset  . Heart disease Father     pacemaker, atrial fibrillation  . Hypertension Father    Social History  Substance Use Topics  . Smoking status: Never Smoker   . Smokeless tobacco: Never Used  . Alcohol Use: 2.4 - 3.0 oz/week    4-5 Cans of beer per week     Comment: daily    Review of Systems  Neurological: Positive for dizziness.   10 Systems reviewed and are negative for acute change except as noted in the HPI.    Allergies  Review of patient's allergies indicates no known allergies.  Home Medications   Prior to Admission medications   Medication Sig Start Date End Date Taking? Authorizing Provider  cetirizine (ZYRTEC) 10 MG tablet Take 10 mg by mouth daily.    Historical Provider, MD  clotrimazole-betamethasone (LOTRISONE) cream Apply 1 application topically 2 (two) times daily. 04/24/15   Tammi Sou, MD  diltiazem (CARDIZEM CD) 180 MG 24 hr capsule Take 1 capsule (180 mg total) by mouth daily. 04/19/15   Tammi Sou, MD  fluticasone (FLONASE) 50 MCG/ACT nasal spray Place 1 spray into both nostrils 2 (two) times daily. 06/21/14   Irene Pap, NP  lisinopril (PRINIVIL,ZESTRIL) 40 MG tablet Take 1 tablet (40 mg total) by mouth daily. 04/19/15   Tammi Sou, MD  LORazepam (ATIVAN) 0.5 MG tablet 1 tab  po tid  X 7d, then 1 tab po bid x 7d, then 1 tab po qd x 7d, then 1/2 tab po qd x 6d, then stop 11/02/14   Tammi Sou, MD  metoprolol (LOPRESSOR) 50 MG tablet Take 1 tablet (50 mg total) by mouth 2 (two) times daily. 03/19/15   Tammi Sou, MD   BP 146/85 mmHg  Pulse 51  Temp(Src) 97.4 F (36.3 C) (Oral)  Resp 13  SpO2 98% Physical Exam  Constitutional: He is oriented to person, place, and time. He appears well-developed and well-nourished. No distress.  Awake, alert  HENT:  Head: Normocephalic and atraumatic.  B/l ear canals occluded  by cerumen  Eyes: Conjunctivae and EOM are normal. Pupils are equal, round, and reactive to light.  Neck: Neck supple.  Cardiovascular: Normal rate, regular rhythm and normal heart sounds.   No murmur heard. Pulmonary/Chest: Effort normal and breath sounds normal. No respiratory distress.  Abdominal: Soft. Bowel sounds are normal. He exhibits no distension.  Musculoskeletal: He exhibits no edema.  Neurological: He is alert and oriented to person, place, and time. He has normal reflexes. No cranial nerve deficit. He exhibits normal muscle tone.  Fluent speech, normal finger-to-nose testing, negative pronator drift; 5/5 strength and normal sensation x all 4 ext  Skin: Skin is warm and dry.  Psychiatric: He has a normal mood and affect. Judgment and thought content normal.  Nursing note and vitals reviewed.   ED Course  Procedures (including critical care time) Labs Review Labs Reviewed  BASIC METABOLIC PANEL - Abnormal; Notable for the following:    Glucose, Bld 172 (*)    All other components within normal limits  CBC WITH DIFFERENTIAL/PLATELET - Abnormal; Notable for the following:    MCHC 36.1 (*)    All other components within normal limits    Imaging Review Mr Brain Wo Contrast (neuro Protocol)  07/25/2015  CLINICAL DATA:  Dizziness 2 days. EXAM: MRI HEAD WITHOUT CONTRAST TECHNIQUE: Multiplanar, multiecho pulse sequences of the brain and surrounding structures were obtained without intravenous contrast. COMPARISON:  None. FINDINGS: Negative for acute infarct. Tiny white matter hyperintensity in the right parietal lobe likely due to chronic ischemia. Negative for demyelinating disease. Cerebral white matter normal. Mild atrophy.  Negative for hydrocephalus. Negative for intracranial hemorrhage. Negative for mass or edema.  No shift of the midline structures. Mucosal edema in the paranasal sinuses.  Bilateral mastoid effusion. Pituitary not enlarged.  Normal skullbase.  Circle Willis  patent. IMPRESSION: Mild atrophy.  No acute abnormality. Electronically Signed   By: Franchot Gallo M.D.   On: 07/25/2015 21:11   I have personally reviewed and evaluated these lab results as part of my medical decision-making.   EKG Interpretation   Date/Time:  Thursday July 25 2015 20:09:17 EDT Ventricular Rate:  53 PR Interval:  173 QRS Duration: 83 QT Interval:  435 QTC Calculation: 408 R Axis:   63 Text Interpretation:  Sinus rhythm Abnormal R-wave progression, early  transition ST elevation, consider inferior injury No significant change  since last tracing Confirmed by LITTLE MD, RACHEL [phone]) on 07/25/2015  8:50:46 PM     Medications  hydrogen peroxide 3 % external solution (not administered)  meclizine (ANTIVERT) tablet 25 mg (25 mg Oral Given 07/25/15 2013)    MDM   Final diagnoses:  None   Patient presents with sudden onset of vertigo yesterday that has been persistent throughout the day today. On exam he was uncomfortable but in no acute  distress. Vital signs notable for mild hypertension. Neurologic exam was normal. He denies any episodic nature of his vertigo and no provoking factors such as head movement prior to the onset of his symptoms; the cousin this history is not completely consistent with BPPV, obtained MRI to rule out intracranial cause of his symptoms. MRI and basic lab work were unremarkable. EKG was unchanged from previous. Gave the patient meclizine and on reexamination he was drinking liquids and stated that he felt improved. Nursing irrigated bilateral ear canals and I discussed supportive care regarding his cerumen impaction. Regarding his vertigo, I provided with meclizine to use as needed at home and instructed to follow-up with PCP if his symptoms do not improved. Provided patient with Epley maneuver instructions. Patient discharged in satisfactory condition.  Sharlett Iles, MD 07/25/15 2138

## 2015-07-25 NOTE — ED Notes (Addendum)
Pt states dizziness starting yesterday around 5 in the afternoon. States it continued into today and has worsened throughout the day. No other neuro deficits observed in triage. Denies any pain, weakness, SOB. Pt states he can hardly open his eyes d/t the dizziness, and when he does it makes him nauseous. Pt states he was very sick with an URI last week. Was seen at urgent care today and given a prednisone shot and antibiotics there for a possible ear infection

## 2015-07-25 NOTE — ED Notes (Signed)
Pt transported to MRI 

## 2016-03-20 ENCOUNTER — Other Ambulatory Visit: Payer: Self-pay

## 2016-03-20 MED ORDER — METOPROLOL TARTRATE 50 MG PO TABS: 50.0000 mg | ORAL_TABLET | Freq: Two times a day (BID) | ORAL | 0 refills | Status: DC

## 2016-04-02 ENCOUNTER — Encounter: Payer: Self-pay | Admitting: Family Medicine

## 2016-04-02 ENCOUNTER — Ambulatory Visit (INDEPENDENT_AMBULATORY_CARE_PROVIDER_SITE_OTHER): Payer: 59 | Admitting: Family Medicine

## 2016-04-02 VITALS — BP 122/83 | HR 83 | Temp 97.9°F | Resp 16 | Ht 74.0 in | Wt 213.5 lb

## 2016-04-02 DIAGNOSIS — R7301 Impaired fasting glucose: Secondary | ICD-10-CM

## 2016-04-02 DIAGNOSIS — I10 Essential (primary) hypertension: Secondary | ICD-10-CM | POA: Diagnosis not present

## 2016-04-02 DIAGNOSIS — Z23 Encounter for immunization: Secondary | ICD-10-CM | POA: Diagnosis not present

## 2016-04-02 DIAGNOSIS — R748 Abnormal levels of other serum enzymes: Secondary | ICD-10-CM

## 2016-04-02 MED ORDER — CLOTRIMAZOLE-BETAMETHASONE 1-0.05 % EX CREA: 1.0000 "application " | TOPICAL_CREAM | Freq: Two times a day (BID) | CUTANEOUS | 3 refills | Status: DC

## 2016-04-02 MED ORDER — LISINOPRIL 40 MG PO TABS: 40.0000 mg | ORAL_TABLET | Freq: Every day | ORAL | 3 refills | Status: DC

## 2016-04-02 MED ORDER — DILTIAZEM HCL ER COATED BEADS 180 MG PO CP24: 180.0000 mg | ORAL_CAPSULE | Freq: Every day | ORAL | 3 refills | Status: DC

## 2016-04-02 MED ORDER — METOPROLOL TARTRATE 50 MG PO TABS: 50.0000 mg | ORAL_TABLET | Freq: Two times a day (BID) | ORAL | 3 refills | Status: DC

## 2016-04-02 NOTE — Progress Notes (Signed)
Pre visit review using our clinic review tool, if applicable. No additional management support is needed unless otherwise documented below in the visit note. 

## 2016-04-02 NOTE — Progress Notes (Signed)
OFFICE VISIT  04/02/2016   CC:  Chief Complaint  Patient presents with  . Follow-up    Pt is not fasting.    HPI:    Patient is a 50 y.o.  male who presents for f/u HTN and metabolic syndrome. Has hx of hyperlipidemia and is intolerant of statins.  Home bp monitoring: all normal per pt.  Compliant with bp meds. No exercise lately.  Not working on diet.  No acute complaints.  ROS: no palpitations, no CP, no SOB, no HAs, no dizziness.  No polyuria or polydipsia. No abd pain or skin color changes.  Past Medical History:  Diagnosis Date  . Anxiety attack   . Atrial flutter ([place]) 2013   Dr. Percival Spanish  . Elevated transaminase level    suspect combo of hyperlip + alcohol  . Hyperlipemia    Intolerant of statins: tolerates welchol  . Hypertension   . Impaired fasting glucose 03/2014   A1c 5.9%   . Metabolic syndrome    IFG, low HDL, elev trigs: nutritionist referral 04/2014 but patient did not show for appointment.  Marland Kitchen Perennial allergic rhinitis     Past Surgical History:  Procedure Laterality Date  . None      Outpatient Medications Prior to Visit  Medication Sig Dispense Refill  . cetirizine (ZYRTEC) 10 MG tablet Take 10 mg by mouth daily.    . fluticasone (FLONASE) 50 MCG/ACT nasal spray Place 1 spray into both nostrils 2 (two) times daily. 16 g 1  . clotrimazole-betamethasone (LOTRISONE) cream Apply 1 application topically 2 (two) times daily. 45 g 3  . diltiazem (CARDIZEM CD) 180 MG 24 hr capsule Take 1 capsule (180 mg total) by mouth daily. 30 capsule 12  . lisinopril (PRINIVIL,ZESTRIL) 40 MG tablet Take 1 tablet (40 mg total) by mouth daily. 30 tablet 12  . metoprolol (LOPRESSOR) 50 MG tablet Take 1 tablet (50 mg total) by mouth 2 (two) times daily. Patient needs to be seen before further refills. 60 tablet 0  . LORazepam (ATIVAN) 0.5 MG tablet 1 tab po tid  X 7d, then 1 tab po bid x 7d, then 1 tab po qd x 7d, then 1/2 tab po qd x 6d, then stop (Patient not taking:  Reported on 04/02/2016) 45 tablet 0  . meclizine (ANTIVERT) 25 MG tablet Take 1 tablet (25 mg total) by mouth 3 (three) times daily as needed for dizziness. (Patient not taking: Reported on 04/02/2016) 10 tablet 0   No facility-administered medications prior to visit.     No Known Allergies  ROS As per HPI  PE: Blood pressure 122/83, pulse 83, temperature 97.9 F (36.6 C), temperature source Oral, resp. rate 16, height 6\' 2"  (1.88 m), weight 213 lb 8 oz (96.8 kg), SpO2 99 %. Gen: Alert, well appearing.  Patient is oriented to person, place, time, and situation. AFFECT: pleasant, lucid thought and speech. CV: RRR, no m/r/g.   LUNGS: CTA bilat, nonlabored resps, good aeration in all lung fields. EXT: no clubbing, cyanosis, or edema.    LABS:  Lab Results  Component Value Date   TSH 3.61 10/30/2014   Lab Results  Component Value Date   WBC 7.7 07/25/2015   HGB 16.0 07/25/2015   HCT 44.3 07/25/2015   MCV 86.9 07/25/2015   PLT 175 07/25/2015   Lab Results  Component Value Date   CREATININE 0.83 07/25/2015   BUN 19 07/25/2015   NA 135 07/25/2015   K 4.6 07/25/2015   CL  101 07/25/2015   CO2 23 07/25/2015   Lab Results  Component Value Date   ALT 62 (H) 10/25/2014   AST 46 (H) 10/25/2014   ALKPHOS 52 10/25/2014   BILITOT 0.9 10/25/2014   Lab Results  Component Value Date   CHOL 211 (H) 10/30/2014   Lab Results  Component Value Date   HDL 29.40 (L) 10/30/2014   No results found for: [facility] Lab Results  Component Value Date   TRIG 390.0 (H) 10/30/2014   Lab Results  Component Value Date   CHOLHDL 7 10/30/2014   Lab Results  Component Value Date   PSA 0.33 10/30/2014   Lab Results  Component Value Date   HGBA1C 5.9 03/23/2014    IMPRESSION AND PLAN:  1) Metabolic syndrome:  Check random glucose and HbA1c today. Pt knows he needs to work on CV exercise + heart health diet.  2) HTN: The current medical regimen is effective;  continue present plan  and medications. Lytes/Cr today.  3) Hx of mild elevation of LFT's: his viral Hep serologies have been checked and were negative in 2015. Hepatic panel today.  Suspect this elevation is from his chronic beer intake.  An After Visit Summary was printed and given to the patient.  FOLLOW UP: Return in about 6 months (around 09/30/2016) for annual CPE (fasting).  Signed:  Crissie Sickles, MD           04/02/2016

## 2016-04-03 LAB — COMPREHENSIVE METABOLIC PANEL
ALBUMIN: 4.4 g/dL (ref 3.5–5.2)
ALK PHOS: 55 U/L (ref 39–117)
ALT: 74 U/L — AB (ref 0–53)
AST: 42 U/L — AB (ref 0–37)
BILIRUBIN TOTAL: 0.6 mg/dL (ref 0.2–1.2)
BUN: 17 mg/dL (ref 6–23)
CALCIUM: 9.5 mg/dL (ref 8.4–10.5)
CHLORIDE: 104 meq/L (ref 96–112)
CO2: 29 mEq/L (ref 19–32)
CREATININE: 1.19 mg/dL (ref 0.40–1.50)
GFR: 68.34 mL/min (ref >60.00)
Glucose, Bld: 86 mg/dL (ref 70–99)
Potassium: 4.6 mEq/L (ref 3.5–5.1)
SODIUM: 140 meq/L (ref 135–145)
TOTAL PROTEIN: 6.9 g/dL (ref 6.0–8.3)

## 2016-04-03 LAB — HEMOGLOBIN A1C: Hgb A1c MFr Bld: 5.9 % (ref 4.6–6.5)

## 2016-04-22 ENCOUNTER — Encounter: Payer: Self-pay | Admitting: Family Medicine

## 2016-04-22 ENCOUNTER — Ambulatory Visit (INDEPENDENT_AMBULATORY_CARE_PROVIDER_SITE_OTHER): Payer: 59 | Admitting: Family Medicine

## 2016-04-22 VITALS — BP 126/68 | HR 80 | Temp 97.8°F | Resp 16 | Ht 74.0 in | Wt 208.2 lb

## 2016-04-22 DIAGNOSIS — J069 Acute upper respiratory infection, unspecified: Secondary | ICD-10-CM

## 2016-04-22 DIAGNOSIS — J209 Acute bronchitis, unspecified: Secondary | ICD-10-CM

## 2016-04-22 MED ORDER — ALBUTEROL SULFATE HFA 108 (90 BASE) MCG/ACT IN AERS: 2.0000 | INHALATION_SPRAY | RESPIRATORY_TRACT | 0 refills | Status: DC | PRN

## 2016-04-22 MED ORDER — PREDNISONE 20 MG PO TABS: ORAL_TABLET | ORAL | 0 refills | Status: DC

## 2016-04-22 NOTE — Patient Instructions (Signed)
Get otc generic robitussin DM OR Mucinex DM and use as directed on the packaging for cough and congestion. Use otc generic saline nasal spray 2-3 times per day to irrigate/moisturize your nasal passages.   

## 2016-04-22 NOTE — Progress Notes (Signed)
Pre visit review using our clinic review tool, if applicable. No additional management support is needed unless otherwise documented below in the visit note. 

## 2016-04-22 NOTE — Progress Notes (Signed)
OFFICE VISIT  04/22/2016   CC:  Chief Complaint  Patient presents with  . URI    x 2-3 weeks   HPI:    Patient is a 50 y.o. Caucasian male who presents for respiratory symptoms. Onset 16d/a of "head cold".  Nasal congestion, cough, PND, achy, tired.  No fever, no pain in face or upper teeth.  Cough is productive.  No SOB.  Some chest tightness.  Rare wheeze.  He is a never smoker. No ST.  No HA.   Sx's not improving.   Past Medical History:  Diagnosis Date  . Anxiety attack   . Atrial flutter ([place]) 2013   Dr. Percival Spanish  . Elevated transaminase level    suspect combo of hyperlip + alcohol  . Hyperlipemia    Intolerant of statins: tolerates welchol  . Hypertension   . Impaired fasting glucose 03/2014   A1c 5.9%   . Metabolic syndrome    IFG, low HDL, elev trigs: nutritionist referral 04/2014 but patient did not show for appointment.  Marland Kitchen Perennial allergic rhinitis     Past Surgical History:  Procedure Laterality Date  . None      Outpatient Medications Prior to Visit  Medication Sig Dispense Refill  . cetirizine (ZYRTEC) 10 MG tablet Take 10 mg by mouth daily.    . clotrimazole-betamethasone (LOTRISONE) cream Apply 1 application topically 2 (two) times daily. 45 g 3  . diltiazem (CARDIZEM CD) 180 MG 24 hr capsule Take 1 capsule (180 mg total) by mouth daily. 90 capsule 3  . fluticasone (FLONASE) 50 MCG/ACT nasal spray Place 1 spray into both nostrils 2 (two) times daily. 16 g 1  . lisinopril (PRINIVIL,ZESTRIL) 40 MG tablet Take 1 tablet (40 mg total) by mouth daily. 90 tablet 3  . metoprolol (LOPRESSOR) 50 MG tablet Take 1 tablet (50 mg total) by mouth 2 (two) times daily. 180 tablet 3   No facility-administered medications prior to visit.     No Known Allergies  ROS As per HPI  PE: Blood pressure 126/68, pulse 80, temperature 97.8 F (36.6 C), temperature source Oral, resp. rate 16, height 6\' 2"  (1.88 m), weight 208 lb 4 oz (94.5 kg), SpO2 97 %. VS:  noted--normal. Gen: alert, NAD, NONTOXIC APPEARING. HEENT: eyes without injection, drainage, or swelling.  Ears: EACs clear, TMs with normal light reflex and landmarks.  Nose: Clear rhinorrhea, with some dried, crusty exudate adherent to mildly injected mucosa.  No purulent d/c.  No paranasal sinus TTP.  No facial swelling.  Throat and mouth without focal lesion.  No pharyngial swelling, erythema, or exudate.   Neck: supple, no LAD.   LUNGS: CTA bilat, nonlabored resps.   CV: RRR, occ ectopic beat is heard, no m/r/g. EXT: no c/c/e SKIN: no rash   LABS:  none  IMPRESSION AND PLAN:  URI with acute bronchitis, suspect viral etiology. Prednisone 40mg  qd x 5d. Ventolin HFA, 1-2 p q4h prn.  Nurse did inhaler education for pt while in the exam room today. Get otc generic robitussin DM OR Mucinex DM and use as directed on the packaging for cough and congestion. Use otc generic saline nasal spray 2-3 times per day to irrigate/moisturize your nasal passages.  An After Visit Summary was printed and given to the patient.  FOLLOW UP: Return if symptoms worsen or fail to improve.  Signed:  Crissie Sickles, MD           04/22/2016

## 2016-05-17 ENCOUNTER — Other Ambulatory Visit: Payer: Self-pay | Admitting: Family Medicine

## 2016-05-18 NOTE — Telephone Encounter (Signed)
Rx sent on 04/02/16 #90 w/ 3RF.

## 2016-06-24 ENCOUNTER — Telehealth: Payer: Self-pay | Admitting: *Deleted

## 2016-06-24 MED ORDER — METOPROLOL TARTRATE 50 MG PO TABS: 50.0000 mg | ORAL_TABLET | Freq: Two times a day (BID) | ORAL | 1 refills | Status: DC

## 2016-06-24 NOTE — Telephone Encounter (Signed)
Fax from pharmacy came in requested 90 day supply for the metoprolol. We sent Rx on 03/23/16 for #180 w/ 3RF. SW Juliann Pulse at [facility] and she stated that they did not receive that Rx. I gave verbal order to fill for #180 w/ 1RF.

## 2016-09-29 ENCOUNTER — Encounter: Payer: Self-pay | Admitting: Family Medicine

## 2016-09-29 ENCOUNTER — Encounter: Payer: 59 | Admitting: Family Medicine

## 2016-09-29 NOTE — Progress Notes (Deleted)
Office Note 09/29/2016  CC: No chief complaint on file.   HPI:  Riley Moore is a 51 y.o. male who is here for annual health maintenance exam.   Past Medical History:  Diagnosis Date  . Anxiety attack   . Atrial flutter ([place]) 2013   Dr. Percival Spanish  . Elevated transaminase level    suspect combo of hyperlip + alcohol  . Hyperlipemia    Intolerant of statins: tolerates welchol  . Hypertension   . Impaired fasting glucose 03/2014   A1c 5.9%   . Metabolic syndrome    IFG, low HDL, elev trigs: nutritionist referral 04/2014 but patient did not show for appointment.  Marland Kitchen Perennial allergic rhinitis     Past Surgical History:  Procedure Laterality Date  . None      Family History  Problem Relation Age of Onset  . Heart disease Father        pacemaker, atrial fibrillation  . Hypertension Father     Social History   Social History  . Marital status: Single    Spouse name: N/A  . Number of children: 2  . Years of education: N/A   Occupational History  . Chief Financial Officer    Social History Main Topics  . Smoking status: Never Smoker  . Smokeless tobacco: Never Used  . Alcohol use 2.4 - 3.0 oz/week    4 - 5 Cans of beer per week     Comment: daily  . Drug use: No  . Sexual activity: Not on file   Other Topics Concern  . Not on file   Social History Narrative   Lives at home with wife and two children.     Occupation: Chief Financial Officer.   No tob or drugs.  Alc: 4 beers per day.  Denies hx of alc problems/abuse.  However, he says he tried quitting in the past and after a few days he started to have "panic attacks".    Outpatient Medications Prior to Visit  Medication Sig Dispense Refill  . albuterol (VENTOLIN HFA) 108 (90 Base) MCG/ACT inhaler Inhale 2 puffs into the lungs every 4 (four) hours as needed for wheezing or shortness of breath. 1 Inhaler 0  . cetirizine (ZYRTEC) 10 MG tablet Take 10 mg by mouth daily.    . clotrimazole-betamethasone  (LOTRISONE) cream Apply 1 application topically 2 (two) times daily. 45 g 3  . diltiazem (CARDIZEM CD) 180 MG 24 hr capsule Take 1 capsule (180 mg total) by mouth daily. 90 capsule 3  . fluticasone (FLONASE) 50 MCG/ACT nasal spray Place 1 spray into both nostrils 2 (two) times daily. 16 g 1  . lisinopril (PRINIVIL,ZESTRIL) 40 MG tablet Take 1 tablet (40 mg total) by mouth daily. 90 tablet 3  . metoprolol (LOPRESSOR) 50 MG tablet Take 1 tablet (50 mg total) by mouth 2 (two) times daily. 180 tablet 1  . predniSONE (DELTASONE) 20 MG tablet 2 tabs po qd x 5d 10 tablet 0   No facility-administered medications prior to visit.     No Known Allergies  ROS *** PE; There were no vitals taken for this visit. *** Pertinent labs:  Lab Results  Component Value Date   TSH 3.61 10/30/2014   Lab Results  Component Value Date   WBC 7.7 07/25/2015   HGB 16.0 07/25/2015   HCT 44.3 07/25/2015   MCV 86.9 07/25/2015   PLT 175 07/25/2015   Lab Results  Component Value Date   CREATININE 1.19 04/02/2016   BUN  17 04/02/2016   NA 140 04/02/2016   K 4.6 04/02/2016   CL 104 04/02/2016   CO2 29 04/02/2016   Lab Results  Component Value Date   ALT 74 (H) 04/02/2016   AST 42 (H) 04/02/2016   ALKPHOS 55 04/02/2016   BILITOT 0.6 04/02/2016   Lab Results  Component Value Date   CHOL 211 (H) 10/30/2014   Lab Results  Component Value Date   HDL 29.40 (L) 10/30/2014   No results found for: [facility] Lab Results  Component Value Date   TRIG 390.0 (H) 10/30/2014   Lab Results  Component Value Date   CHOLHDL 7 10/30/2014   Lab Results  Component Value Date   PSA 0.33 10/30/2014   Lab Results  Component Value Date   HGBA1C 5.9 04/02/2016     ASSESSMENT AND PLAN:   No problem-specific Assessment & Plan notes found for this encounter.  Prostate ca screening: DRE , PSA today. Colon ca screening: referred pt to GI 2016 but he never went.  An After Visit Summary was printed and given to  the patient.  FOLLOW UP:  No Follow-up on file.  Signed:  Crissie Sickles, MD           09/29/2016

## 2016-11-18 ENCOUNTER — Other Ambulatory Visit: Payer: Self-pay | Admitting: Family Medicine

## 2016-11-18 NOTE — Telephone Encounter (Signed)
[facility].  RF request for metoprolol LOV: 04/02/16 Next ov: None Last written: 06/24/16 #180 w/ 1RF  Will send Rx for #180 w/ 0RF. Pt needs office visit for f/u RCI or CPE for more refills.

## 2016-11-23 NOTE — Telephone Encounter (Signed)
Left message for pt to call back  °

## 2016-11-27 NOTE — Telephone Encounter (Signed)
Pt advised and voiced understanding. He stated that he is on vacation right now but will call back once he can check his calendar.

## 2017-03-20 ENCOUNTER — Other Ambulatory Visit: Payer: Self-pay | Admitting: Family Medicine

## 2017-03-22 MED ORDER — METOPROLOL TARTRATE 50 MG PO TABS: 50.0000 mg | ORAL_TABLET | Freq: Two times a day (BID) | ORAL | 0 refills | Status: DC

## 2017-03-22 NOTE — Telephone Encounter (Signed)
SW pt and he stated that he did not need [facility] [place] but he does need one sent to [facility]. Rx sent for #60 w/ 0RF. Pt advised that he needs f/u or CPE. Apt for f/u RCI made for 03/24/17 at 9:15am.

## 2017-03-24 ENCOUNTER — Ambulatory Visit: Payer: 59 | Admitting: Family Medicine

## 2017-03-24 ENCOUNTER — Other Ambulatory Visit: Payer: Self-pay | Admitting: Family Medicine

## 2017-03-24 NOTE — Progress Notes (Deleted)
OFFICE VISIT  03/24/2017   CC: No chief complaint on file.  HPI:    Patient is a 51 y.o.  male who presents for f/u HTN, metabolic syndrome (IFG, low HDL, elev trigs), hx of elevated transaminases. I last saw him for f/u about 1 yr ago.  Past Medical History:  Diagnosis Date  . Anxiety attack   . Atrial flutter ([place]) 2013   Dr. Percival Spanish  . Elevated transaminase level    suspect combo of hyperlip + alcohol  . Hyperlipemia    Intolerant of statins: tolerates welchol  . Hypertension   . Impaired fasting glucose 03/2014   A1c 5.9%   . Metabolic syndrome    IFG, low HDL, elev trigs: nutritionist referral 04/2014 but patient did not show for appointment.  Marland Kitchen Perennial allergic rhinitis     Past Surgical History:  Procedure Laterality Date  . TRANSTHORACIC ECHOCARDIOGRAM  04/2012   NORMAL (ED 55-65%)    Outpatient Medications Prior to Visit  Medication Sig Dispense Refill  . albuterol (VENTOLIN HFA) 108 (90 Base) MCG/ACT inhaler Inhale 2 puffs into the lungs every 4 (four) hours as needed for wheezing or shortness of breath. 1 Inhaler 0  . cetirizine (ZYRTEC) 10 MG tablet Take 10 mg by mouth daily.    . clotrimazole-betamethasone (LOTRISONE) cream Apply 1 application topically 2 (two) times daily. 45 g 3  . diltiazem (CARDIZEM CD) 180 MG 24 hr capsule Take 1 capsule (180 mg total) by mouth daily. 90 capsule 3  . fluticasone (FLONASE) 50 MCG/ACT nasal spray Place 1 spray into both nostrils 2 (two) times daily. 16 g 1  . lisinopril (PRINIVIL,ZESTRIL) 40 MG tablet Take 1 tablet (40 mg total) by mouth daily. 90 tablet 3  . metoprolol tartrate (LOPRESSOR) 50 MG tablet Take 1 tablet (50 mg total) 2 (two) times daily by mouth. 60 tablet 0  . predniSONE (DELTASONE) 20 MG tablet 2 tabs po qd x 5d 10 tablet 0   No facility-administered medications prior to visit.     No Known Allergies  ROS As per HPI  PE: There were no vitals taken for this visit. ***  LABS:  Lab Results   Component Value Date   TSH 3.61 10/30/2014   Lab Results  Component Value Date   WBC 7.7 07/25/2015   HGB 16.0 07/25/2015   HCT 44.3 07/25/2015   MCV 86.9 07/25/2015   PLT 175 07/25/2015   Lab Results  Component Value Date   CREATININE 1.19 04/02/2016   BUN 17 04/02/2016   NA 140 04/02/2016   K 4.6 04/02/2016   CL 104 04/02/2016   CO2 29 04/02/2016   Lab Results  Component Value Date   ALT 74 (H) 04/02/2016   AST 42 (H) 04/02/2016   ALKPHOS 55 04/02/2016   BILITOT 0.6 04/02/2016   Lab Results  Component Value Date   CHOL 211 (H) 10/30/2014   Lab Results  Component Value Date   HDL 29.40 (L) 10/30/2014   No results found for: [facility] Lab Results  Component Value Date   TRIG 390.0 (H) 10/30/2014   Lab Results  Component Value Date   CHOLHDL 7 10/30/2014   Lab Results  Component Value Date   PSA 0.33 10/30/2014   Lab Results  Component Value Date   HGBA1C 5.9 04/02/2016    IMPRESSION AND PLAN:  No problem-specific Assessment & Plan notes found for this encounter.   FOLLOW UP: No Follow-up on file.

## 2017-04-18 ENCOUNTER — Other Ambulatory Visit: Payer: Self-pay | Admitting: Family Medicine

## 2017-04-25 NOTE — Progress Notes (Signed)
OFFICE VISIT  04/26/2017   CC:  Chief Complaint  Patient presents with  . Follow-up    RCI / Refills   HPI:    Patient is a 51 y.o.  male who presents for f/u HTN, metabolic syndrome, hx of atrial flutter.   HTN: no home monitoring in "a long time": @ 1 yr.  Compliant with med. Diet: he found out he is gluten intolerant.  He stopped gluten intake and this has helped him a lot. Trying to exercise more--walking now, hopes to restart running.  A-flutter: is taking cardizem and lopressor every day.  Denies palpitations, racing heart, dizziness, presyncope/syncope, CP, or SOB.  No LE swelling. Has not seen cardiologist since 2013, at which time he was on ASA and cardiz/metop. No recent resp illness, fevers, or GI illness.  Past Medical History:  Diagnosis Date  . Anxiety attack   . Atrial flutter ([place]) 2013   Dr. Percival Spanish.  Pt spontaneously converted in ED.  Outpt eval showed normal ECHO--ASA qd and BB/CCB started.  . Elevated transaminase level    suspect combo of hyperlip + alcohol  . History of gluten intolerance   . Hyperlipemia    Intolerant of statins: tolerates welchol  . Hypertension   . Impaired fasting glucose 03/2014   A1c 5.9%   . Metabolic syndrome    IFG, low HDL, elev trigs: nutritionist referral 04/2014 but patient did not show for appointment.  Marland Kitchen Perennial allergic rhinitis     Past Surgical History:  Procedure Laterality Date  . TRANSTHORACIC ECHOCARDIOGRAM  04/2012   NORMAL (ED 55-65%)    Outpatient Medications Prior to Visit  Medication Sig Dispense Refill  . cetirizine (ZYRTEC) 10 MG tablet Take 10 mg by mouth daily.    . clotrimazole-betamethasone (LOTRISONE) cream Apply 1 application topically 2 (two) times daily. 45 g 3  . diltiazem (CARDIZEM CD) 180 MG 24 hr capsule Take 1 capsule (180 mg total) by mouth daily. 90 capsule 3  . fluticasone (FLONASE) 50 MCG/ACT nasal spray Place 1 spray into both nostrils 2 (two) times daily. 16 g 1  . lisinopril  (PRINIVIL,ZESTRIL) 40 MG tablet Take 1 tablet (40 mg total) by mouth daily. 90 tablet 3  . albuterol (VENTOLIN HFA) 108 (90 Base) MCG/ACT inhaler Inhale 2 puffs into the lungs every 4 (four) hours as needed for wheezing or shortness of breath. (Patient not taking: Reported on 04/26/2017) 1 Inhaler 0  . metoprolol tartrate (LOPRESSOR) 50 MG tablet TAKE 1 TABLET (50 MG TOTAL) 2 (TWO) TIMES DAILY BY MOUTH. (Patient not taking: Reported on 04/26/2017) 60 tablet 5  . predniSONE (DELTASONE) 20 MG tablet 2 tabs po qd x 5d 10 tablet 0   No facility-administered medications prior to visit.     No Known Allergies  ROS As per HPI  PE: Blood pressure 122/80, pulse 64, temperature 97.7 F (36.5 C), temperature source Oral, resp. rate 16, weight 209 lb (94.8 kg), SpO2 99 %. Gen: Alert, well appearing.  Patient is oriented to person, place, time, and situation. AFFECT: pleasant, lucid thought and speech. CV: irreg irreg, no m/r Chest is clear, no wheezing or rales. Normal symmetric air entry throughout both lung fields. No chest wall deformities or tenderness. EXT: no clubbing, cyanosis, or edema.   LABS:  Lab Results  Component Value Date   TSH 3.61 10/30/2014   Lab Results  Component Value Date   WBC 7.7 07/25/2015   HGB 16.0 07/25/2015   HCT 44.3 07/25/2015  MCV 86.9 07/25/2015   PLT 175 07/25/2015   Lab Results  Component Value Date   CREATININE 1.19 04/02/2016   BUN 17 04/02/2016   NA 140 04/02/2016   K 4.6 04/02/2016   CL 104 04/02/2016   CO2 29 04/02/2016   Lab Results  Component Value Date   ALT 74 (H) 04/02/2016   AST 42 (H) 04/02/2016   ALKPHOS 55 04/02/2016   BILITOT 0.6 04/02/2016   Lab Results  Component Value Date   CHOL 211 (H) 10/30/2014   Lab Results  Component Value Date   HDL 29.40 (L) 10/30/2014   No results found for: [facility] Lab Results  Component Value Date   TRIG 390.0 (H) 10/30/2014   Lab Results  Component Value Date   CHOLHDL 7 10/30/2014    Lab Results  Component Value Date   PSA 0.33 10/30/2014   Lab Results  Component Value Date   HGBA1C 5.9 04/02/2016   12 lead EKG today: a-fib, rate 66, low voltage inferior and lateral limb leads.  No ischemic changes.  No LVH, no PVCs. A-fib new compared to EKG 07/25/15.  IMPRESSION AND PLAN:  1) A-fib, new dx.  Hx of a-flutter that spontaneously converted. Rate is well controlled on CCB and BB currently. Pt is asymptomatic. Discussed pt with cardiologist on call at [facility] and he reviewed case and agreed pt in a-fib and since CHADsVASc score= 1 that stroke risk low enough to anticoagulate with ASA 81mg  at this time. Will check lytes, TSH.  ECHO 2012 normal. Will defer decision regarding any further cardiac testing to Dr. Percival Spanish, whom the patient will f/u with in the next 2-4 wks. Will help patient arrange this f/u.  2) HTN: The current medical regimen is effective;  continue present plan and medications. Lytes/cr today.  3) Metabolic syndrome: recheck HbA1c, FLP, and CMP today. Encourage pt to get back in habit of TLC.  An After Visit Summary was printed and given to the patient.   FOLLOW UP: Return in about 6 months (around 10/25/2017) for routine chronic illness f/u.  Signed:  Crissie Sickles, MD           04/26/2017

## 2017-04-26 ENCOUNTER — Ambulatory Visit: Payer: 59 | Admitting: Family Medicine

## 2017-04-26 ENCOUNTER — Encounter: Payer: Self-pay | Admitting: Family Medicine

## 2017-04-26 VITALS — BP 122/80 | HR 64 | Temp 97.7°F | Resp 16 | Wt 209.0 lb

## 2017-04-26 DIAGNOSIS — E7800 Pure hypercholesterolemia, unspecified: Secondary | ICD-10-CM

## 2017-04-26 DIAGNOSIS — R7301 Impaired fasting glucose: Secondary | ICD-10-CM | POA: Diagnosis not present

## 2017-04-26 DIAGNOSIS — I10 Essential (primary) hypertension: Secondary | ICD-10-CM | POA: Diagnosis not present

## 2017-04-26 DIAGNOSIS — I4891 Unspecified atrial fibrillation: Secondary | ICD-10-CM | POA: Diagnosis not present

## 2017-04-26 DIAGNOSIS — Z8679 Personal history of other diseases of the circulatory system: Secondary | ICD-10-CM | POA: Diagnosis not present

## 2017-04-26 DIAGNOSIS — R740 Nonspecific elevation of levels of transaminase and lactic acid dehydrogenase [LDH]: Secondary | ICD-10-CM

## 2017-04-26 DIAGNOSIS — R7401 Elevation of levels of liver transaminase levels: Secondary | ICD-10-CM

## 2017-04-26 DIAGNOSIS — E8881 Metabolic syndrome: Secondary | ICD-10-CM | POA: Diagnosis not present

## 2017-04-26 DIAGNOSIS — I499 Cardiac arrhythmia, unspecified: Secondary | ICD-10-CM | POA: Diagnosis not present

## 2017-04-27 ENCOUNTER — Encounter: Payer: Self-pay | Admitting: Family Medicine

## 2017-04-27 LAB — COMPREHENSIVE METABOLIC PANEL
AG Ratio: 1.7 (calc) (ref 1.0–2.5)
ALBUMIN MSPROF: 4.3 g/dL (ref 3.6–5.1)
ALT: 47 U/L — AB (ref 9–46)
AST: 31 U/L (ref 10–35)
Alkaline phosphatase (APISO): 51 U/L (ref 40–115)
BUN: 19 mg/dL (ref 7–25)
CALCIUM: 9.4 mg/dL (ref 8.6–10.3)
CO2: 26 mmol/L (ref 20–32)
CREATININE: 1.07 mg/dL (ref 0.70–1.33)
Chloride: 102 mmol/L (ref 98–110)
GLUCOSE: 110 mg/dL — AB (ref 65–99)
Globulin: 2.5 g/dL (calc) (ref 1.9–3.7)
Potassium: 4.7 mmol/L (ref 3.5–5.3)
Sodium: 138 mmol/L (ref 135–146)
Total Bilirubin: 1 mg/dL (ref 0.2–1.2)
Total Protein: 6.8 g/dL (ref 6.1–8.1)

## 2017-04-27 LAB — HEMOGLOBIN A1C
Hgb A1c MFr Bld: 5.6 % of total Hgb (ref <5.7)
Mean Plasma Glucose: 114 (calc)
eAG (mmol/L): 6.3 (calc)

## 2017-04-27 LAB — LIPID PANEL
CHOL/HDL RATIO: 5.7 (calc) — AB (ref <5.0)
Cholesterol: 200 mg/dL — ABNORMAL HIGH (ref <200)
HDL: 35 mg/dL — ABNORMAL LOW (ref >40)
LDL CHOLESTEROL (CALC): 133 mg/dL — AB
NON-HDL CHOLESTEROL (CALC): 165 mg/dL — AB (ref <130)
Triglycerides: 186 mg/dL — ABNORMAL HIGH (ref <150)

## 2017-04-27 LAB — TSH: TSH: 1.76 mIU/L (ref 0.40–4.50)

## 2017-04-29 ENCOUNTER — Encounter (HOSPITAL_COMMUNITY): Payer: Self-pay

## 2017-04-29 ENCOUNTER — Emergency Department (HOSPITAL_COMMUNITY): Payer: 59

## 2017-04-29 ENCOUNTER — Other Ambulatory Visit: Payer: Self-pay

## 2017-04-29 ENCOUNTER — Emergency Department (HOSPITAL_COMMUNITY)
Admission: EM | Admit: 2017-04-29 | Discharge: 2017-04-30 | Disposition: A | Discharge status: Home / Self Care | Payer: 59 | Attending: Emergency Medicine | Admitting: Emergency Medicine

## 2017-04-29 ENCOUNTER — Ambulatory Visit: Payer: Self-pay

## 2017-04-29 DIAGNOSIS — R079 Chest pain, unspecified: Secondary | ICD-10-CM | POA: Diagnosis not present

## 2017-04-29 DIAGNOSIS — Z7982 Long term (current) use of aspirin: Secondary | ICD-10-CM | POA: Insufficient documentation

## 2017-04-29 DIAGNOSIS — I10 Essential (primary) hypertension: Secondary | ICD-10-CM | POA: Diagnosis not present

## 2017-04-29 DIAGNOSIS — I481 Persistent atrial fibrillation: Secondary | ICD-10-CM | POA: Diagnosis not present

## 2017-04-29 DIAGNOSIS — R072 Precordial pain: Secondary | ICD-10-CM

## 2017-04-29 DIAGNOSIS — Z79899 Other long term (current) drug therapy: Secondary | ICD-10-CM | POA: Diagnosis not present

## 2017-04-29 DIAGNOSIS — I4819 Other persistent atrial fibrillation: Secondary | ICD-10-CM

## 2017-04-29 DIAGNOSIS — R0789 Other chest pain: Secondary | ICD-10-CM | POA: Diagnosis present

## 2017-04-29 LAB — CBC
HEMATOCRIT: 47.7 % (ref 39.0–52.0)
HEMOGLOBIN: 16.6 g/dL (ref 13.0–17.0)
MCH: 31.9 pg (ref 26.0–34.0)
MCHC: 34.8 g/dL (ref 30.0–36.0)
MCV: 91.6 fL (ref 78.0–100.0)
Platelets: 182 10*3/uL (ref 150–400)
RBC: 5.21 MIL/uL (ref 4.22–5.81)
RDW: 12.9 % (ref 11.5–15.5)
WBC: 10.6 10*3/uL — AB (ref 4.0–10.5)

## 2017-04-29 LAB — BASIC METABOLIC PANEL
ANION GAP: 8 (ref 5–15)
BUN: 14 mg/dL (ref 6–20)
CALCIUM: 9.1 mg/dL (ref 8.9–10.3)
CO2: 26 mmol/L (ref 22–32)
Chloride: 102 mmol/L (ref 101–111)
Creatinine, Ser: 0.89 mg/dL (ref 0.61–1.24)
GFR calc Af Amer: >60 mL/min (ref >60)
Glucose, Bld: 97 mg/dL (ref 65–99)
POTASSIUM: 3.7 mmol/L (ref 3.5–5.1)
SODIUM: 136 mmol/L (ref 135–145)

## 2017-04-29 LAB — I-STAT TROPONIN, ED
TROPONIN I, POC: 0 ng/mL (ref 0.00–0.08)
TROPONIN I, POC: 0 ng/mL (ref 0.00–0.08)

## 2017-04-29 MED ORDER — ASPIRIN 81 MG PO CHEW: 81.0000 mg | CHEWABLE_TABLET | Freq: Every day | ORAL | 0 refills | Status: DC

## 2017-04-29 MED ORDER — ASPIRIN 81 MG PO CHEW
324.0000 mg | CHEWABLE_TABLET | Freq: Once | ORAL | Status: AC
  Administered 2017-04-29: 324 mg via ORAL
  Filled 2017-04-29: qty 4

## 2017-04-29 NOTE — Telephone Encounter (Signed)
Pt called stating he is having right sided chest "pressure" near his arm pit and collar bone. Denies SOB or radiation to arm, back, jaw. States he has had this for 2 weeks but is getting worse. Pt stated he is driving down [address] and asked which ED to go to. Advised to go to [facility] ED.

## 2017-04-29 NOTE — ED Triage Notes (Signed)
Pt here for chest pain that started 2 weeks ago but increased today. Pt states his PCP told him to come here for further eval. Pt denies any associated symptoms. Pt describes the pain as someone pressing on his chest.

## 2017-04-29 NOTE — ED Provider Notes (Signed)
[place] EMERGENCY DEPARTMENT Provider Note   CSN: [id number] Arrival date & time: 04/29/17  1703     History   Chief Complaint Chief Complaint  Patient presents with  . Chest Pain    HPI Riley Moore is a 51 y.o. male.  Riley Moore is a 51 y.o. Male who presents to the ED complaining of chest pressure constant for the past 8-10 days that worsened around 3:30 pm today.  Patient reports for the past 8-10 days he has had constant right-sided chest pressure.  He reports it was constant until it worsened slightly around 3:30 PM today while he was sitting at his desk at work.  He reports he took some ibuprofen and this improved.  He reports he is now back to his baseline over the past 8-10 days.  He denies any shortness of breath, coughing or wheezing. He denies pain worsening with exertion.  He was recently diagnosed with a fib by his PCP about a week ago.  He cannot tell when he is in afib.  He reports PCP spoke with the cardiologist who did not recommend anticoagulation at this time. Just 81 mg asa.  He has an appointment with cardiology next week.  Patient denies history of MI.  No close family history of MI, DVT or PE.  No recent long travel.  He is not a smoker.  He does have history of hypertension and hyperlipidemia.  He denies fevers, coughing, shortness of breath, palpitations, numbness, tingling, weakness, leg pain, leg swelling, recent long travel, smoking, abdominal pain, nausea, vomiting or rashes.   The history is provided by the patient and medical records. No language interpreter was used.  Chest Pain   Pertinent negatives include no abdominal pain, no back pain, no cough, no fever, no headaches, no nausea, no numbness, no palpitations, no shortness of breath, no vomiting and no weakness.    Past Medical History:  Diagnosis Date  . Anxiety attack   . Atrial flutter ([place]) 2013   Dr. Percival Spanish.  Pt spontaneously converted in ED.  Outpt eval showed normal  ECHO--ASA qd and BB/CCB started.  . Elevated transaminase level    suspect combo of hyperlip/fatty liver + alcohol.  Viral Hep panel neg 2016.  Marland Kitchen History of gluten intolerance   . Hyperlipemia    Intolerant of statins: tolerates welchol  . Hypertension   . Impaired fasting glucose 03/2014   A1c 5.9%   . Metabolic syndrome    IFG, low HDL, elev trigs: nutritionist referral 04/2014 but patient did not show for appointment.  Marland Kitchen Perennial allergic rhinitis     Patient Active Problem List   Diagnosis Date Noted  . Health maintenance examination 11/02/2014  . Essential hypertension 03/22/2014  . Hyperlipidemia 03/22/2014  . Alcohol abuse 03/22/2014  . Atrial flutter ([place]) 07/22/2012    Past Surgical History:  Procedure Laterality Date  . TRANSTHORACIC ECHOCARDIOGRAM  04/2012   NORMAL (ED 55-65%)       Home Medications    Prior to Admission medications   Medication Sig Start Date End Date Taking? Authorizing Provider  Ascorbic Acid (VITAMIN C PO) Take 1 tablet by mouth daily.   Yes [provider]  cetirizine (ZYRTEC) 10 MG tablet Take 10 mg by mouth daily.   Yes [provider]  clotrimazole-betamethasone (LOTRISONE) cream Apply 1 application topically 2 (two) times daily. Patient taking differently: Apply 1 application topically 2 (two) times daily as needed (flare up).  04/02/16  Yes McGowen,  Adrian Blackwater, MD  diltiazem (CARDIZEM CD) 180 MG 24 hr capsule Take 1 capsule (180 mg total) by mouth daily. 04/02/16  Yes McGowen, Adrian Blackwater, MD  ibuprofen (ADVIL,MOTRIN) 200 MG tablet Take 400 mg by mouth every 6 (six) hours as needed for mild pain.   Yes [provider]  lisinopril (PRINIVIL,ZESTRIL) 40 MG tablet Take 1 tablet (40 mg total) by mouth daily. 04/02/16  Yes McGowen, Adrian Blackwater, MD  metoprolol tartrate (LOPRESSOR) 50 MG tablet TAKE 1 TABLET (50 MG TOTAL) 2 (TWO) TIMES DAILY BY MOUTH. 04/19/17  Yes McGowen, Adrian Blackwater, MD  albuterol (VENTOLIN HFA) 108 (90  Base) MCG/ACT inhaler Inhale 2 puffs into the lungs every 4 (four) hours as needed for wheezing or shortness of breath. Patient not taking: Reported on 04/26/2017 04/22/16   Tammi Sou, MD  aspirin 81 MG chewable tablet Chew 1 tablet (81 mg total) by mouth daily. 04/29/17   Waynetta Pean, PA-C  fluticasone (FLONASE) 50 MCG/ACT nasal spray Place 1 spray into both nostrils 2 (two) times daily. Patient not taking: Reported on 04/29/2017 06/21/14   Irene Pap, NP  predniSONE (DELTASONE) 20 MG tablet 2 tabs po qd x 5d Patient not taking: Reported on 04/29/2017 04/22/16   Tammi Sou, MD    Family History Family History  Problem Relation Age of Onset  . Heart disease Father        pacemaker, atrial fibrillation  . Hypertension Father     Social History Social History   Tobacco Use  . Smoking status: Never Smoker  . Smokeless tobacco: Never Used  Substance Use Topics  . Alcohol use: Yes    Alcohol/week: 2.4 - 3.0 oz    Types: 4 - 5 Cans of beer per week    Comment: daily  . Drug use: No     Allergies   Patient has no known allergies.   Review of Systems Review of Systems  Constitutional: Negative for chills and fever.  HENT: Negative for congestion and sore throat.   Eyes: Negative for visual disturbance.  Respiratory: Negative for cough, shortness of breath and wheezing.   Cardiovascular: Positive for chest pain. Negative for palpitations and leg swelling.  Gastrointestinal: Negative for abdominal pain, diarrhea, nausea and vomiting.  Genitourinary: Negative for dysuria.  Musculoskeletal: Negative for back pain and neck pain.  Skin: Negative for rash.  Neurological: Negative for syncope, weakness, light-headedness, numbness and headaches.     Physical Exam Updated Vital Signs BP 112/83   Pulse (!) 58   Temp 97.8 F (36.6 C) (Oral)   Resp 12   SpO2 99%   Physical Exam  Constitutional: He appears well-developed and well-nourished.  Non-toxic  appearance. He does not appear ill. No distress.  HENT:  Head: Normocephalic and atraumatic.  Mouth/Throat: Oropharynx is clear and moist.  Eyes: Conjunctivae are normal. Pupils are equal, round, and reactive to light. Right eye exhibits no discharge. Left eye exhibits no discharge.  Neck: Neck supple. No JVD present.  Cardiovascular: Normal rate, normal heart sounds and intact distal pulses. An irregularly irregular rhythm present. Exam reveals no gallop and no friction rub.  No murmur heard. Pulses:      Radial pulses are 2+ on the right side, and 2+ on the left side.       Dorsalis pedis pulses are 2+ on the right side, and 2+ on the left side.       Posterior tibial pulses are 2+ on the right side,  and 2+ on the left side.  Heart rate around 64 on exam.  Irregularly irregular rhythm.  A. fib on the monitor. Bilateral radial, posterior tibialis and dorsalis pedis pulses are intact.    Pulmonary/Chest: Effort normal and breath sounds normal. No tachypnea. No respiratory distress. He has no wheezes. He has no rales.  Lungs are clear to ascultation bilaterally. Symmetric chest expansion bilaterally. No increased work of breathing. No rales or rhonchi.    Abdominal: Soft. There is no tenderness.  Musculoskeletal: He exhibits no edema.       Right lower leg: He exhibits no tenderness and no edema.       Left lower leg: He exhibits no tenderness and no edema.  Lymphadenopathy:    He has no cervical adenopathy.  Neurological: He is alert. Coordination normal.  Skin: Skin is warm and dry. Capillary refill takes less than 2 seconds. No rash noted. He is not diaphoretic. No erythema. No pallor.  Psychiatric: He has a normal mood and affect. His behavior is normal.  Nursing note and vitals reviewed.    ED Treatments / Results  Labs (all labs ordered are listed, but only abnormal results are displayed) Labs Reviewed  CBC - Abnormal; Notable for the following components:      Result Value    WBC 10.6 (*)    All other components within normal limits  BASIC METABOLIC PANEL  I-STAT TROPONIN, ED  I-STAT TROPONIN, ED    EKG  EKG Interpretation  Date/Time:  Thursday April 29 2017 17:09:34 EST Ventricular Rate:  75 PR Interval:    QRS Duration: 84 QT Interval:  384 QTC Calculation: 428 R Axis:   54 Text Interpretation:  Atrial fibrillation Abnormal ECG No STEMI.  Confirmed by Nanda Quinton [phone]) on 04/29/2017 9:53:57 PM       Radiology Dg Chest 2 View  Result Date: 04/29/2017 CLINICAL DATA:  Chest pain EXAM: CHEST  2 VIEW COMPARISON:  April 05, 2012 FINDINGS: Lungs are clear. Heart size and pulmonary vascularity are normal. No adenopathy. No pneumothorax. No bone lesions. IMPRESSION: No edema or consolidation. Electronically Signed   By: Lowella Grip III M.D.   On: 04/29/2017 18:22    Procedures Procedures (including critical care time)  Medications Ordered in ED Medications  aspirin chewable tablet 324 mg (324 mg Oral Given 04/29/17 2203)     Initial Impression / Assessment and Plan / ED Course  I have reviewed the triage vital signs and the nursing notes.  Pertinent labs & imaging results that were available during my care of the patient were reviewed by me and considered in my medical decision making (see chart for details).  CHA2DS2/VAS Stroke Risk Points      1 >= 2 Points: High Risk  1 - 1.99 Points: Medium Risk  0 Points: Low Risk    This is the only CHA2DS2/VAS Stroke Risk Points available for the past  year.:  Change: N/A     Details    This score determines the patient's risk of having a stroke if the  patient has atrial fibrillation.       Points Metrics  0 Has Congestive Heart Failure:  No   0 Has Vascular Disease:  No   1 Has Hypertension:  Yes   0 Age:  50   0 Has Diabetes:  No   0 Had Stroke:  No  Had TIA:  No  Had thromboembolism:  No   0 Male:  No  This is a 51 y.o. Male who presents to the ED complaining of  chest pressure constant for the past 8-10 days that worsened around 3:30 pm today.  Patient reports for the past 8-10 days he has had constant right-sided chest pressure.  He reports it was constant until it worsened slightly around 3:30 PM today while he was sitting at his desk at work.  He reports he took some ibuprofen and this improved.  He reports he is now back to his baseline over the past 8-10 days.  He denies any shortness of breath, coughing or wheezing. He denies pain worsening with exertion.  He was recently diagnosed with a fib by his PCP about a week ago.  He cannot tell when he is in afib.  He reports PCP spoke with the cardiologist who did not recommend anticoagulation at this time. Just 81 mg asa.  He has an appointment with cardiology next week.  Patient denies history of MI.  No close family history of MI, DVT or PE.  No recent long travel.  He is not a smoker.  He does have history of hypertension and hyperlipidemia.   On exam the patient is afebrile nontoxic-appearing.  EKG shows a-fib/flutter but no evidence of STEMI.  Lungs are clear to auscultation bilaterally.  No chest wall tenderness.  No exertional chest pain.  No lower extremity edema or tenderness.  He is not tachypneic, hypoxic or tachycardic on exam. I am not concerned for PE.   BMP and CBC are unremarkable.  Initial troponin is not elevated.  Chest x-ray is unremarkable. Delta troponin was obtained and this is also not elevated.  Patient reports feeling better at reevaluation after aspirin.  He does report at reevaluation that he does have some worsening pain with movement of his arms.  He reports that he has been lifting more recently and this might be the cause of this pain.  Workup is reassuring.  He has CHA2DS2/VAS score of 1. PCP recommended aspirin only per cardiology consult.  I did encourage him to continue aspirin daily and to keep his follow-up appointment with cardiology next week.  Return precautions discussed. I  advised the patient to follow-up with their primary care provider this week. I advised the patient to return to the emergency department with new or worsening symptoms or new concerns. The patient verbalized understanding and agreement with plan.    This patient was discussed with Dr. Laverta Baltimore who agrees with assessment and plan.   Final Clinical Impressions(s) / ED Diagnoses   Final diagnoses:  Precordial pain  Persistent atrial fibrillation [facility])    ED Discharge Orders        Ordered    aspirin 81 MG chewable tablet  Daily     04/29/17 2346       Waynetta Pean, PA-C 04/30/17 0001    Long, Wonda [facility], MD 04/30/17 [phone]

## 2017-04-30 NOTE — Telephone Encounter (Signed)
Noted  

## 2017-05-06 ENCOUNTER — Encounter (HOSPITAL_COMMUNITY): Payer: Self-pay | Admitting: Nurse Practitioner

## 2017-05-06 ENCOUNTER — Ambulatory Visit (HOSPITAL_COMMUNITY)
Admission: RE | Admit: 2017-05-06 | Discharge: 2017-05-06 | Disposition: A | Discharge status: Home / Self Care | Payer: 59 | Source: Ambulatory Visit | Attending: Nurse Practitioner | Admitting: Nurse Practitioner

## 2017-05-06 VITALS — BP 138/84 | HR 79 | Ht 74.0 in | Wt 212.0 lb

## 2017-05-06 DIAGNOSIS — I481 Persistent atrial fibrillation: Secondary | ICD-10-CM

## 2017-05-06 DIAGNOSIS — Z7982 Long term (current) use of aspirin: Secondary | ICD-10-CM | POA: Insufficient documentation

## 2017-05-06 DIAGNOSIS — Z79899 Other long term (current) drug therapy: Secondary | ICD-10-CM | POA: Insufficient documentation

## 2017-05-06 DIAGNOSIS — I4891 Unspecified atrial fibrillation: Secondary | ICD-10-CM | POA: Insufficient documentation

## 2017-05-06 DIAGNOSIS — I4819 Other persistent atrial fibrillation: Secondary | ICD-10-CM

## 2017-05-06 DIAGNOSIS — I10 Essential (primary) hypertension: Secondary | ICD-10-CM | POA: Insufficient documentation

## 2017-05-06 NOTE — Progress Notes (Signed)
Primary Care Physician: Tammi Sou, MD Referring Physician: [facility] ER f/u   Riley Moore is a 52 y.o. male with a h/o afib first onset in 2013. He did well for a number of years but recently found to be in afib in December, when he went to the ER for chest pain. His chest pain has resolved but he continues to be in afib. It is rate controlled. He is unaware. He does not drink large amounts of caffeine but states that he drinks a six pack a night and may increase to 8 on the weekends for anxiety. He reports that he will have panic attacks if he cuts down on his beer intake. He has been on ASA daily, diltiazem and metoprolol for years. Chadsvasc score is 1 for HTN.  Today, he denies symptoms of palpitations, chest pain, shortness of breath, orthopnea, PND, lower extremity edema, dizziness, presyncope, syncope, or neurologic sequela. The patient is tolerating medications without difficulties and is otherwise without complaint today.   Past Medical History:  Diagnosis Date  . Anxiety attack   . Atrial flutter ([place]) 2013   Dr. Percival Spanish.  Pt spontaneously converted in ED.  Outpt eval showed normal ECHO--ASA qd and BB/CCB started.  . Elevated transaminase level    suspect combo of hyperlip/fatty liver + alcohol.  Viral Hep panel neg 2016.  Marland Kitchen History of gluten intolerance   . Hyperlipemia    Intolerant of statins: tolerates welchol  . Hypertension   . Impaired fasting glucose 03/2014   A1c 5.9%   . Metabolic syndrome    IFG, low HDL, elev trigs: nutritionist referral 04/2014 but patient did not show for appointment.  Marland Kitchen Perennial allergic rhinitis    Past Surgical History:  Procedure Laterality Date  . TRANSTHORACIC ECHOCARDIOGRAM  04/2012   NORMAL (ED 55-65%)    Current Outpatient Medications  Medication Sig Dispense Refill  . Ascorbic Acid (VITAMIN C PO) Take 1 tablet by mouth daily.    Marland Kitchen aspirin 81 MG chewable tablet Chew 1 tablet (81 mg total) by mouth daily. 30 tablet 0  .  cetirizine (ZYRTEC) 10 MG tablet Take 10 mg by mouth daily.    . clotrimazole-betamethasone (LOTRISONE) cream Apply 1 application topically 2 (two) times daily. (Patient taking differently: Apply 1 application topically 2 (two) times daily as needed (flare up). ) 45 g 3  . diltiazem (CARDIZEM CD) 180 MG 24 hr capsule Take 1 capsule (180 mg total) by mouth daily. 90 capsule 3  . fluticasone (FLONASE) 50 MCG/ACT nasal spray Place 1 spray into both nostrils 2 (two) times daily. 16 g 1  . ibuprofen (ADVIL,MOTRIN) 200 MG tablet Take 400 mg by mouth every 6 (six) hours as needed for mild pain.    Marland Kitchen lisinopril (PRINIVIL,ZESTRIL) 40 MG tablet Take 1 tablet (40 mg total) by mouth daily. 90 tablet 3  . metoprolol tartrate (LOPRESSOR) 50 MG tablet TAKE 1 TABLET (50 MG TOTAL) 2 (TWO) TIMES DAILY BY MOUTH. 60 tablet 5   No current facility-administered medications for this encounter.     No Known Allergies  Social History   Socioeconomic History  . Marital status: Single    Spouse name: Not on file  . Number of children: 2  . Years of education: Not on file  . Highest education level: Not on file  Social Needs  . Financial resource strain: Not on file  . Food insecurity - worry: Not on file  . Food insecurity - inability: Not on  file  . Transportation needs - medical: Not on file  . Transportation needs - non-medical: Not on file  Occupational History  . Occupation: Chief Financial Officer  Tobacco Use  . Smoking status: Never Smoker  . Smokeless tobacco: Never Used  Substance and Sexual Activity  . Alcohol use: Yes    Alcohol/week: 2.4 - 3.0 oz    Types: 4 - 5 Cans of beer per week    Comment: daily  . Drug use: No  . Sexual activity: Not on file  Other Topics Concern  . Not on file  Social History Narrative   Lives at home with wife and two children.     Occupation: Chief Financial Officer.   No tob or drugs.  Alc: 4 beers per day.  Denies hx of alc problems/abuse.  However, he says he  tried quitting in the past and after a few days he started to have "panic attacks".    Family History  Problem Relation Age of Onset  . Heart disease Father        pacemaker, atrial fibrillation  . Hypertension Father     ROS- All systems are reviewed and negative except as per the HPI above  Physical Exam: Vitals:   05/06/17 0847  BP: 138/84  Pulse: 79  Weight: 212 lb (96.2 kg)  Height: 6\' 2"  (1.88 m)   Wt Readings from Last 3 Encounters:  05/06/17 212 lb (96.2 kg)  04/26/17 209 lb (94.8 kg)  04/22/16 208 lb 4 oz (94.5 kg)    Labs: Lab Results  Component Value Date   NA 136 04/29/2017   K 3.7 04/29/2017   CL 102 04/29/2017   CO2 26 04/29/2017   GLUCOSE 97 04/29/2017   BUN 14 04/29/2017   CREATININE 0.89 04/29/2017   CALCIUM 9.1 04/29/2017   No results found for: INR Lab Results  Component Value Date   CHOL 200 (H) 04/26/2017   HDL 35 (L) 04/26/2017   TRIG 186 (H) 04/26/2017     GEN- The patient is well appearing, alert and oriented x 3 today.   Head- normocephalic, atraumatic Eyes-  Sclera clear, conjunctiva pink Ears- hearing intact Oropharynx- clear Neck- supple, no JVP Lymph- no cervical lymphadenopathy Lungs- Clear to ausculation bilaterally, normal work of breathing Heart- irregular rate and rhythm, no murmurs, rubs or gallops, PMI not laterally displaced GI- soft, NT, ND, + BS Extremities- no clubbing, cyanosis, or edema MS- no significant deformity or atrophy Skin- no rash or lesion Psych- euthymic mood, full affect Neuro- strength and sensation are intact  EKG-afib at 79 bpm, qrs int 78 ms, qtc 405 ms Echo-2013-Study Conclusions  - Left ventricle: The cavity size was normal. Wall thickness was increased in a pattern of mild LVH. Systolic function was normal. The estimated ejection fraction was in the range of 55% to 65%. Wall motion was normal; there were no regional wall motion abnormalities. Left ventricular diastolic  function parameters were normal. - Atrial septum: No defect or patent foramen ovale was identified.     Assessment and Plan: 1. Persistent  asymptomatic afib He will probably need a cardioversion, which would mean starting anticoagulation but I am concerned with increased risk of GI bleed starting a DOAC with his current alcohol intake, which will also undermine ability to maintain SR He states that he uses alcohol to treat his anxiety disorder, but there is medicine for that that will not aggravate afib issues  I recommended that he see his PCP to start antianxiety  meds and start weaning off alcohol Then he can start anticoagulation and be set up for cardioversion 3 weeks later  He may need to update echo but he sees Dr. Percival Spanish 1/17 and he can see if this is needed If he has been successful to reduce alcohol intake, anticoagulation can be started and I will be glad to see back after sufficient time on anticoagulation to schedule cardioversion with f/u.  For now he will stay on asa for chadsvasc score of 1 Continue BB/CCB at current doses  2. HTN  Stable   Riley Moore, [facility] [address] [place], [place] [address] [phone]

## 2017-05-18 ENCOUNTER — Other Ambulatory Visit: Payer: Self-pay | Admitting: Family Medicine

## 2017-05-18 NOTE — Progress Notes (Signed)
Cardiology Office Note   Date:  05/20/2017   ID:  Riley Moore, DOB 30-May-1965, MRN [id number]  PCP:  Riley Sou, MD  Cardiologist:   Riley Breeding, MD Referring:  Riley Sou, MD  Chief Complaint  Patient presents with  . Atrial Fibrillation      History of Present Illness: Riley Moore is a 52 y.o. male who presents for follow up of atrial fib.  He was seen initially in 2013 for this.  He had recurrence recently and was seen in the ED and in atrial fib clinic.  At that time he had chest pain.  It was noted that he drinks six to 8 beers per night.     He does not notice his atrial fibrillation.  He is obviously still in this by exam.  He does not feel any palpitations, presyncope or syncope.  He denies any chest pressure, neck or arm discomfort.  He has no shortness of breath, PND or orthopnea.  No weight gain or edema.  He does have stress at work and at home.  He self medicates with the alcohol.   Past Medical History:  Diagnosis Date  . Anxiety attack   . Atrial flutter ([place]) 2013   Dr. Percival Spanish.  Pt spontaneously converted in ED.  Outpt eval showed normal ECHO--ASA qd and BB/CCB started.  . Elevated transaminase level    suspect combo of hyperlip/fatty liver + alcohol.  Viral Hep panel neg 2016.  Marland Kitchen History of gluten intolerance   . Hyperlipemia    Intolerant of statins: tolerates welchol  . Hypertension   . Impaired fasting glucose 03/2014  . Metabolic syndrome    IFG, low HDL, elev trigs: nutritionist referral 04/2014 but patient did not show for appointment.  Marland Kitchen Perennial allergic rhinitis     Past Surgical History:  Procedure Laterality Date  . TRANSTHORACIC ECHOCARDIOGRAM  04/2012   NORMAL (ED 55-65%)     Current Outpatient Medications  Medication Sig Dispense Refill  . cetirizine (ZYRTEC) 10 MG tablet Take 10 mg by mouth daily.    Marland Kitchen diltiazem (CARDIZEM CD) 180 MG 24 hr capsule TAKE 1 CAPSULE (180 MG TOTAL) BY MOUTH DAILY. 90 capsule 3  .  ibuprofen (ADVIL,MOTRIN) 200 MG tablet Take 400 mg by mouth every 6 (six) hours as needed for mild pain.    Marland Kitchen lisinopril (PRINIVIL,ZESTRIL) 40 MG tablet TAKE 1 TABLET (40 MG TOTAL) BY MOUTH DAILY. 90 tablet 3  . metoprolol tartrate (LOPRESSOR) 50 MG tablet TAKE 1 TABLET (50 MG TOTAL) 2 (TWO) TIMES DAILY BY MOUTH. 60 tablet 5  . rivaroxaban (XARELTO) 20 MG TABS tablet Take 1 tablet (20 mg total) by mouth daily with supper. 30 tablet 11   No current facility-administered medications for this visit.     Allergies:   Patient has no known allergies.    Social History:  The patient  reports that  has never smoked. he has never used smokeless tobacco. He reports that he drinks about 2.4 - 3.0 oz of alcohol per week. He reports that he does not use drugs.   Family History:  The patient's family history includes Heart disease in his father; Hypertension in his father.    ROS:  Please see the history of present illness.   Otherwise, review of systems are positive for none.   All other systems are reviewed and negative.    PHYSICAL EXAM: VS:  BP 129/78   Pulse 76   Ht 6\' 2"  (  1.88 m)   Wt 212 lb 12.8 oz (96.5 kg)   BMI 27.32 kg/m  , BMI Body mass index is 27.32 kg/m. GENERAL:  Well appearing HEENT:  Pupils equal round and reactive, fundi not visualized, oral mucosa unremarkable NECK:  No jugular venous distention, waveform within normal limits, carotid upstroke brisk and symmetric, no bruits, no thyromegaly LYMPHATICS:  No cervical, inguinal adenopathy LUNGS:  Clear to auscultation bilaterally BACK:  No CVA tenderness CHEST:  Unremarkable HEART:  PMI not displaced or sustained,S1 and S2 within normal limits, no S3, no clicks, no rubs, no murmurs, irregular ABD:  Flat, positive bowel sounds normal in frequency in pitch, no bruits, no rebound, no guarding, no midline pulsatile mass, no hepatomegaly, no splenomegaly EXT:  2 plus pulses throughout, no edema, no cyanosis no clubbing SKIN:  No  rashes no nodules NEURO:  Cranial nerves II through XII grossly intact, motor grossly intact throughout PSYCH:  Cognitively intact, oriented to person place and time    EKG:  EKG is not ordered today. The ekg ordered 05/06/17 demonstrates atrial fibrillation, rate 79, axis within normal limits, intervals within normal limits, no acute ST-T wave changes.   Recent Labs: 04/26/2017: ALT 47; TSH 1.76 04/29/2017: BUN 14; Creatinine, Ser 0.89; Hemoglobin 16.6; Platelets 182; Potassium 3.7; Sodium 136    Lipid Panel    Component Value Date/Time   CHOL 200 (H) 04/26/2017 0843   TRIG 186 (H) 04/26/2017 0843   HDL 35 (L) 04/26/2017 0843   CHOLHDL 5.7 (H) 04/26/2017 0843   VLDL 78.0 (H) 10/30/2014 0805   LDLDIRECT 114.0 10/30/2014 0805      Wt Readings from Last 3 Encounters:  05/20/17 212 lb 12.8 oz (96.5 kg)  05/06/17 212 lb (96.2 kg)  04/26/17 209 lb (94.8 kg)      Other studies Reviewed: Additional studies/ records that were reviewed today include: ED records. Review of the above records demonstrates:  Please see elsewhere in the note.     ASSESSMENT AND PLAN:  ATRIAL FIB: I think it is reasonable to try to get him into a normal rhythm. Mr. [name] has a CHA2DS2 - VASc score of 1.  However, for cardioversion he will need anticoagulation and I will start Xarelto 20 mg daily.  I am also going to check an echocardiogram.  I will then plan cardioversion with a course of 4 weeks of anticoagulation following this.  The cardioversion will be 3 weeks after starting his medications.  He understands the risk of bleeding with the alcohol use but I think this is a limited over a 7-week course.  He also understands that it is less likely that he will maintain sinus rhythm if he does not reduce his alcohol use which is addressed below.  Of note he had a normal TSH and electrolytes and CBC and has no other contraindication anticoagulation therapy.  He will stop his aspirin.Marland Kitchen    ETOH USE: I  asked him to discuss this with McGowen, Adrian Blackwater, MD.  He needs long-term management of anxiety/panic and should stop medicating with EtOH.    HTN:  The blood pressure is at target. No change in medications is indicated. We will continue with therapeutic lifestyle changes (TLC).   Current medicines are reviewed at length with the patient today.  The patient does not have concerns regarding medicines.  The following changes have been made:  As above  Labs/ tests ordered today include:   Orders Placed This Encounter  Procedures  .  ELECTRICAL CARDIOVERSION  . CBC  . Basic Metabolic Panel (BMET)  . TSH  . ECHOCARDIOGRAM COMPLETE     Disposition:   FU with me after the cardioversion.      Signed, Riley Breeding, MD  05/20/2017 10:31 AM    [facility]

## 2017-05-20 ENCOUNTER — Encounter: Payer: Self-pay | Admitting: Cardiology

## 2017-05-20 ENCOUNTER — Ambulatory Visit: Payer: 59 | Admitting: Cardiology

## 2017-05-20 VITALS — BP 129/78 | HR 76 | Ht 74.0 in | Wt 212.8 lb

## 2017-05-20 DIAGNOSIS — Z79899 Other long term (current) drug therapy: Secondary | ICD-10-CM

## 2017-05-20 DIAGNOSIS — I4891 Unspecified atrial fibrillation: Secondary | ICD-10-CM

## 2017-05-20 DIAGNOSIS — I10 Essential (primary) hypertension: Secondary | ICD-10-CM

## 2017-05-20 MED ORDER — RIVAROXABAN 20 MG PO TABS: 20.0000 mg | ORAL_TABLET | Freq: Every day | ORAL | 11 refills | Status: DC

## 2017-05-20 NOTE — Patient Instructions (Signed)
Medication Instructions:  STOP- Aspirin START- Xarelto 20 mg daily  If you need a refill on your cardiac medications before your next appointment, please call your pharmacy.  Labwork: Pre Op Labs    Testing/Procedures: Your physician has requested that you have an echocardiogram. Echocardiography is a painless test that uses sound waves to create images of your heart. It provides your doctor with information about the size and shape of your heart and how well your heart's chambers and valves are working. This procedure takes approximately one hour. There are no restrictions for this procedure.  Your physician has recommended that you have a Cardioversion (DCCV) in 3 weeks. Electrical Cardioversion uses a jolt of electricity to your heart either through paddles or wired patches attached to your chest. This is a controlled, usually prescheduled, procedure. Defibrillation is done under light anesthesia in the hospital, and you usually go home the day of the procedure. This is done to get your heart back into a normal rhythm. You are not awake for the procedure. Please see the instruction sheet given to you today.   Follow-Up: Your physician wants you to follow-up in: After Cardioversion.    Thank you for choosing [facility] at [facility]!!

## 2017-05-26 ENCOUNTER — Encounter: Payer: Self-pay | Admitting: Family Medicine

## 2017-05-27 ENCOUNTER — Other Ambulatory Visit: Payer: Self-pay

## 2017-05-27 ENCOUNTER — Ambulatory Visit (HOSPITAL_COMMUNITY): Payer: 59 | Attending: Cardiology

## 2017-05-27 DIAGNOSIS — E785 Hyperlipidemia, unspecified: Secondary | ICD-10-CM | POA: Insufficient documentation

## 2017-05-27 DIAGNOSIS — I10 Essential (primary) hypertension: Secondary | ICD-10-CM | POA: Diagnosis not present

## 2017-05-27 DIAGNOSIS — I4891 Unspecified atrial fibrillation: Secondary | ICD-10-CM | POA: Diagnosis not present

## 2017-06-09 ENCOUNTER — Telehealth: Payer: Self-pay | Admitting: Cardiology

## 2017-06-09 ENCOUNTER — Other Ambulatory Visit: Payer: Self-pay | Admitting: *Deleted

## 2017-06-09 DIAGNOSIS — I4891 Unspecified atrial fibrillation: Secondary | ICD-10-CM

## 2017-06-09 DIAGNOSIS — Z01818 Encounter for other preprocedural examination: Secondary | ICD-10-CM

## 2017-06-09 DIAGNOSIS — Z79899 Other long term (current) drug therapy: Secondary | ICD-10-CM | POA: Diagnosis not present

## 2017-06-09 LAB — CBC
HEMATOCRIT: 44.7 % (ref 37.5–51.0)
Hemoglobin: 15.6 g/dL (ref 13.0–17.7)
MCH: 32.1 pg (ref 26.6–33.0)
MCHC: 34.9 g/dL (ref 31.5–35.7)
MCV: 92 fL (ref 79–97)
Platelets: 193 10*3/uL (ref 150–379)
RBC: 4.86 x10E6/uL (ref 4.14–5.80)
RDW: 13.3 % (ref 12.3–15.4)
WBC: 6.8 10*3/uL (ref 3.4–10.8)

## 2017-06-09 LAB — BASIC METABOLIC PANEL
BUN / CREAT RATIO: 19 (ref 9–20)
BUN: 16 mg/dL (ref 6–24)
CO2: 24 mmol/L (ref 20–29)
CREATININE: 0.83 mg/dL (ref 0.76–1.27)
Calcium: 9.4 mg/dL (ref 8.7–10.2)
Chloride: 99 mmol/L (ref 96–106)
GFR calc Af Amer: 117 mL/min/{1.73_m2} (ref >59)
GFR, EST NON AFRICAN AMERICAN: 101 mL/min/{1.73_m2} (ref >59)
Glucose: 114 mg/dL — ABNORMAL HIGH (ref 65–99)
POTASSIUM: 4.8 mmol/L (ref 3.5–5.2)
SODIUM: 137 mmol/L (ref 134–144)

## 2017-06-09 LAB — TSH: TSH: 2.15 u[IU]/mL (ref 0.450–4.500)

## 2017-06-09 NOTE — Telephone Encounter (Signed)
She wants to know what time does the pt need to arrive at the hospital for his procedure on 06-11-17?

## 2017-06-09 NOTE — Telephone Encounter (Signed)
Returned the call to patient's wife, per the dpr. She has been informed that the patient should arrive at the hospital no later than 9:30 am. She has also been informed that he needs to come in for his lab work. She has verbalized her understanding.

## 2017-06-11 ENCOUNTER — Other Ambulatory Visit: Payer: Self-pay

## 2017-06-11 ENCOUNTER — Encounter (HOSPITAL_COMMUNITY): Payer: Self-pay | Admitting: Cardiovascular Disease

## 2017-06-11 ENCOUNTER — Ambulatory Visit (HOSPITAL_COMMUNITY): Payer: 59 | Admitting: Anesthesiology

## 2017-06-11 ENCOUNTER — Encounter (HOSPITAL_COMMUNITY)
Admission: RE | Disposition: A | Discharge status: Home / Self Care | Payer: Self-pay | Source: Ambulatory Visit | Attending: Cardiovascular Disease

## 2017-06-11 ENCOUNTER — Ambulatory Visit (HOSPITAL_COMMUNITY)
Admission: RE | Admit: 2017-06-11 | Discharge: 2017-06-11 | Disposition: A | Discharge status: Home / Self Care | Payer: 59 | Source: Ambulatory Visit | Attending: Cardiovascular Disease | Admitting: Cardiovascular Disease

## 2017-06-11 DIAGNOSIS — Z01818 Encounter for other preprocedural examination: Secondary | ICD-10-CM

## 2017-06-11 DIAGNOSIS — I4891 Unspecified atrial fibrillation: Secondary | ICD-10-CM

## 2017-06-11 DIAGNOSIS — I4892 Unspecified atrial flutter: Secondary | ICD-10-CM | POA: Diagnosis not present

## 2017-06-11 DIAGNOSIS — E8881 Metabolic syndrome: Secondary | ICD-10-CM | POA: Insufficient documentation

## 2017-06-11 DIAGNOSIS — Z7901 Long term (current) use of anticoagulants: Secondary | ICD-10-CM | POA: Diagnosis not present

## 2017-06-11 DIAGNOSIS — Z791 Long term (current) use of non-steroidal anti-inflammatories (NSAID): Secondary | ICD-10-CM | POA: Diagnosis not present

## 2017-06-11 DIAGNOSIS — Z8249 Family history of ischemic heart disease and other diseases of the circulatory system: Secondary | ICD-10-CM | POA: Insufficient documentation

## 2017-06-11 DIAGNOSIS — F410 Panic disorder [episodic paroxysmal anxiety] without agoraphobia: Secondary | ICD-10-CM | POA: Diagnosis not present

## 2017-06-11 DIAGNOSIS — J3089 Other allergic rhinitis: Secondary | ICD-10-CM | POA: Diagnosis not present

## 2017-06-11 DIAGNOSIS — Z79899 Other long term (current) drug therapy: Secondary | ICD-10-CM | POA: Insufficient documentation

## 2017-06-11 DIAGNOSIS — I10 Essential (primary) hypertension: Secondary | ICD-10-CM | POA: Diagnosis not present

## 2017-06-11 HISTORY — PX: CARDIOVERSION: SHX1299

## 2017-06-11 HISTORY — PX: OTHER SURGICAL HISTORY: SHX169

## 2017-06-11 SURGERY — CARDIOVERSION
Anesthesia: General

## 2017-06-11 MED ORDER — PROPOFOL 10 MG/ML IV BOLUS
INTRAVENOUS | Status: DC | PRN
  Administered 2017-06-11: 100 mg via INTRAVENOUS

## 2017-06-11 MED ORDER — SODIUM CHLORIDE 0.9 % IV SOLN
INTRAVENOUS | Status: DC
  Administered 2017-06-11 (×2): via INTRAVENOUS

## 2017-06-11 MED ORDER — LIDOCAINE HCL (CARDIAC) 20 MG/ML IV SOLN
INTRAVENOUS | Status: DC | PRN
  Administered 2017-06-11: 80 mg via INTRAVENOUS

## 2017-06-11 NOTE — CV Procedure (Addendum)
Electrical Cardioversion Procedure Note Riley Moore [id number] 07-23-1965  Procedure: Electrical Cardioversion Indications:  Atrial Fibrillation  Procedure Details Consent: Risks of procedure as well as the alternatives and risks of each were explained to the (patient/caregiver).  Consent for procedure obtained. Time Out: Verified patient identification, verified procedure, site/side was marked, verified correct patient position, special equipment/implants available, medications/allergies/relevent history reviewed, required imaging and test results available.  Performed  Patient placed on cardiac monitor, pulse oximetry, supplemental oxygen as necessary.  Sedation given: propofol Pacer pads placed anterior and posterior chest.  Cardioverted 1 time(s).  Cardioverted at 150J.  Evaluation Findings: Post procedure EKG shows: sinus bradycardia Complications: None Patient did tolerate procedure well.  Within 5 minutes of waking up Riley Moore went back into atrial fibrillation.   Riley Latch, MD 06/11/2017, 10:03 AM

## 2017-06-11 NOTE — Discharge Instructions (Signed)
Electrical Cardioversion, Care After °This sheet gives you information about how to care for yourself after your procedure. Your health care provider may also give you more specific instructions. If you have problems or questions, contact your health care provider. °What can I expect after the procedure? °After the procedure, it is common to have: °· Some redness on the skin where the shocks were given. ° °Follow these instructions at home: °· Do not drive for 24 hours if you were given a medicine to help you relax (sedative). °· Take over-the-counter and prescription medicines only as told by your health care provider. °· Ask your health care provider how to check your pulse. Check it often. °· Rest for 48 hours after the procedure or as told by your health care provider. °· Avoid or limit your caffeine use as told by your health care provider. °Contact a health care provider if: °· You feel like your heart is beating too quickly or your pulse is not regular. °· You have a serious muscle cramp that does not go away. °Get help right away if: °· You have discomfort in your chest. °· You are dizzy or you feel faint. °· You have trouble breathing or you are short of breath. °· Your speech is slurred. °· You have trouble moving an arm or leg on one side of your body. °· Your fingers or toes turn cold or blue. °This information is not intended to replace advice given to you by your health care provider. Make sure you discuss any questions you have with your health care provider. °Document Released: 02/08/2013 Document Revised: 11/22/2015 Document Reviewed: 10/25/2015 °Elsevier Interactive Patient Education © 2018 [employer] Inc[employer] °Monitored Anesthesia Care, Care After °These instructions provide you with information about caring for yourself after your procedure. Your health care provider may also give you more specific instructions. Your treatment has been planned according to current medical practices, but problems  sometimes occur. Call your health care provider if you have any problems or questions after your procedure. °What can I expect after the procedure? °After your procedure, it is common to: °· Feel sleepy for several hours. °· Feel clumsy and have poor balance for several hours. °· Feel forgetful about what happened after the procedure. °· Have poor judgment for several hours. °· Feel nauseous or vomit. °· Have a sore throat if you had a breathing tube during the procedure. ° °Follow these instructions at home: °For at least 24 hours after the procedure: ° °· Do not: °? Participate in activities in which you could fall or become injured. °? Drive. °? Use heavy machinery. °? Drink alcohol. °? Take sleeping pills or medicines that cause drowsiness. °? Make important decisions or sign legal documents. °? Take care of children on your own. °· Rest. °Eating and drinking °· Follow the diet that is recommended by your health care provider. °· If you vomit, drink water, juice, or soup when you can drink without vomiting. °· Make sure you have little or no nausea before eating solid foods. °General instructions °· Have a responsible adult stay with you until you are awake and alert. °· Take over-the-counter and prescription medicines only as told by your health care provider. °· If you smoke, do not smoke without supervision. °· Keep all follow-up visits as told by your health care provider. This is important. °Contact a health care provider if: °· You keep feeling nauseous or you keep vomiting. °· You feel light-headed. °· You develop a rash. °·   You have a fever. °Get help right away if: °· You have trouble breathing. °This information is not intended to replace advice given to you by your health care provider. Make sure you discuss any questions you have with your health care provider. °Document Released: 08/11/2015 Document Revised: 12/11/2015 Document Reviewed: 08/11/2015 °Elsevier Interactive Patient Education © 2018  Elsevier Inc[employer] ° °

## 2017-06-11 NOTE — Anesthesia Preprocedure Evaluation (Addendum)
Anesthesia Evaluation  Patient identified by MRN, date of birth, ID band Patient awake    Reviewed: Allergy & Precautions, NPO status , Patient's Chart, lab work & pertinent test results  Airway Mallampati: II  TM Distance: >3 FB Neck ROM: Full    Dental no notable dental hx.    Pulmonary neg pulmonary ROS,    Pulmonary exam normal breath sounds clear to auscultation       Cardiovascular hypertension, Normal cardiovascular exam Rhythm:Regular Rate:Normal     Neuro/Psych negative neurological ROS  negative psych ROS   GI/Hepatic negative GI ROS, Neg liver ROS,   Endo/Other  negative endocrine ROS  Renal/GU negative Renal ROS  negative genitourinary   Musculoskeletal negative musculoskeletal ROS (+)   Abdominal   Peds  Hematology negative hematology ROS (+)   Anesthesia Other Findings   Reproductive/Obstetrics                            Anesthesia Physical Anesthesia Plan  ASA: II  Anesthesia Plan: General   Post-op Pain Management:    Induction: Intravenous  PONV Risk Score and Plan:   Airway Management Planned: Mask, Natural Airway and Nasal Cannula  Additional Equipment:   Intra-op Plan:   Post-operative Plan:   Informed Consent: I have reviewed the patients History and Physical, chart, labs and discussed the procedure including the risks, benefits and alternatives for the proposed anesthesia with the patient or authorized representative who has indicated his/her understanding and acceptance.   Dental advisory given  Plan Discussed with: CRNA  Anesthesia Plan Comments:         Anesthesia Quick Evaluation

## 2017-06-11 NOTE — Anesthesia Postprocedure Evaluation (Signed)
Anesthesia Post Note  Patient: Riley Moore  Procedure(s) Performed: CARDIOVERSION (N/A )     Patient location during evaluation: PACU Anesthesia Type: General Level of consciousness: awake and alert Pain management: pain level controlled Vital Signs Assessment: post-procedure vital signs reviewed and stable Respiratory status: spontaneous breathing, nonlabored ventilation, respiratory function stable and patient connected to nasal cannula oxygen Cardiovascular status: blood pressure returned to baseline and stable Postop Assessment: no apparent nausea or vomiting Anesthetic complications: no    Last Vitals:  Vitals:   06/11/17 1015 06/11/17 1030  BP: (!) 144/96 (!) 146/97  Pulse: 73   Resp: 11 12  Temp:    SpO2: 97% 98%    Last Pain:  Vitals:   06/11/17 1001  TempSrc: Oral                 [name]

## 2017-06-11 NOTE — Transfer of Care (Signed)
Immediate Anesthesia Transfer of Care Note  Patient: Riley Moore  Procedure(s) Performed: CARDIOVERSION (N/A )  Patient Location: Endoscopy Unit  Anesthesia Type:General  Level of Consciousness: awake, alert  and oriented  Airway & Oxygen Therapy: Patient Spontanous Breathing and Patient connected to nasal cannula oxygen  Post-op Assessment: Report given to RN and Post -op Vital signs reviewed and stable  Post vital signs: Reviewed and stable  Last Vitals:  Vitals:   06/11/17 0958 06/11/17 0959  BP:  (!) 145/83  Pulse: 76   Resp: 14   Temp:    SpO2: 98%     Last Pain:  Vitals:   06/11/17 0920  TempSrc: Oral         Complications: No apparent anesthesia complications

## 2017-06-14 ENCOUNTER — Encounter (HOSPITAL_COMMUNITY): Payer: Self-pay | Admitting: Cardiovascular Disease

## 2017-06-24 ENCOUNTER — Encounter: Payer: Self-pay | Admitting: Cardiology

## 2017-06-28 NOTE — Progress Notes (Signed)
Cardiology Office Note   Date:  07/01/2017   ID:  Riley Moore, DOB 04/02/1966, MRN [id number]  PCP:  Riley Sou, MD  Cardiologist:   No primary care provider on file. Referring:  Riley Sou, MD  Chief Complaint  Patient presents with  . Atrial Fibrillation      History of Present Illness: Riley Moore is a 52 y.o. male who presents for follow up of atrial fib.  He was seen initially in 2013 for this.  He had recurrence recently and was seen in the ED and in atrial fib clinic.  At that time he had chest pain.  It was noted that he drinks six to 8 beers per night.   He has a low embolic risk but Xarelto was used prior to attempted cardioversion.  He had an echo which was without significant findings.    He had cardioversion earlier this month and initially went into NSR but before leaving the hospital he was back in fib.    He does not feel the atrial fib.  The patient denies any new symptoms such as chest discomfort, neck or arm discomfort. There has been no new shortness of breath, PND or orthopnea. There have been no reported palpitations, presyncope or syncope.  He is committed to trying to quit drinking.    Past Medical History:  Diagnosis Date  . Anxiety attack   . Elevated transaminase level    suspect combo of hyperlip/fatty liver + alcohol.  Viral Hep panel neg 2016.  Marland Kitchen History of gluten intolerance   . Hyperlipemia    Intolerant of statins: tolerates welchol  . Hypertension   . Impaired fasting glucose 03/2014  . Metabolic syndrome    IFG, low HDL, elev trigs: nutritionist referral 04/2014 but patient did not show for appointment.  Marland Kitchen Perennial allergic rhinitis   . Persistent atrial fibrillation (Riley Moore) 2013; 2019   Dr. Percival Moore.  Pt spontaneously converted in ED.  Outpt eval showed normal ECHO--ASA qd and BB/CCB started.  Recurrence (asympt) 2019--needs cardioversion per cards, started on xarelto, echo fine, DC cardioversion not successful 06/11/17-- Needs  anxiety med, stop self-medicating with ETOH.    Past Surgical History:  Procedure Laterality Date  . CARDIOVERSION N/A 06/11/2017   Procedure: CARDIOVERSION;  Surgeon: Riley Latch, MD;  Location: [facility];  Service: Cardiovascular;  Laterality: N/A;  . DC cardioversion  06/11/2017   was back in A-fib within 5 min of waking up.  . TRANSTHORACIC ECHOCARDIOGRAM  04/2012   NORMAL (ED 55-65%)     Current Outpatient Medications  Medication Sig Dispense Refill  . Ascorbic Acid (VITAMIN C PO) Take 1 tablet by mouth daily.    . cetirizine (ZYRTEC) 10 MG tablet Take 10 mg by mouth at bedtime.     Marland Kitchen diltiazem (CARDIZEM CD) 180 MG 24 hr capsule TAKE 1 CAPSULE (180 MG TOTAL) BY MOUTH DAILY. 90 capsule 3  . Krill Oil 1000 MG CAPS Take 1,000 mg by mouth 2 (two) times daily.    Marland Kitchen lisinopril (PRINIVIL,ZESTRIL) 40 MG tablet TAKE 1 TABLET (40 MG TOTAL) BY MOUTH DAILY. 90 tablet 3  . metoprolol tartrate (LOPRESSOR) 50 MG tablet TAKE 1 TABLET (50 MG TOTAL) 2 (TWO) TIMES DAILY BY MOUTH. 60 tablet 5  . rivaroxaban (XARELTO) 20 MG TABS tablet Take 1 tablet (20 mg total) by mouth daily with supper. 30 tablet 11   No current facility-administered medications for this visit.     Allergies:  Patient has no known allergies.    ROS:  Please see the history of present illness.   Otherwise, review of systems are positive for none.   All other systems are reviewed and negative.    PHYSICAL EXAM: VS:  BP 128/68   Pulse 75   Ht 6\' 2"  (1.88 m)   Wt 210 lb (95.3 kg)   BMI 26.96 kg/m  , BMI Body mass index is 26.96 kg/m.  GENERAL:  Well appearing NECK:  No jugular venous distention, waveform within normal limits, carotid upstroke brisk and symmetric, no bruits, no thyromegaly LUNGS:  Clear to auscultation bilaterally CHEST:  Unremarkable HEART:  PMI not displaced or sustained,S1 and S2 within normal limits, no S3, no clicks, no rubs, no murmurs, irregular ABD:  Flat, positive bowel sounds normal in  frequency in pitch, no bruits, no rebound, no guarding, no midline pulsatile mass, no hepatomegaly, no splenomegaly EXT:  2 plus pulses throughout, no edema, no cyanosis no clubbing   EKG:  EKG  ordered today. The ekg ordered 05/06/17 demonstrates atrial fibrillation, rate 75, axis within normal limits, intervals within normal limits, no acute ST-T wave changes.   Recent Labs: 04/26/2017: ALT 47 06/09/2017: BUN 16; Creatinine, Ser 0.83; Hemoglobin 15.6; Platelets 193; Potassium 4.8; Sodium 137; TSH 2.150    Lipid Panel    Component Value Date/Time   CHOL 200 (H) 04/26/2017 0843   TRIG 186 (H) 04/26/2017 0843   HDL 35 (L) 04/26/2017 0843   CHOLHDL 5.7 (H) 04/26/2017 0843   VLDL 78.0 (H) 10/30/2014 0805   LDLDIRECT 114.0 10/30/2014 0805      Wt Readings from Last 3 Encounters:  07/01/17 210 lb (95.3 kg)  06/11/17 208 lb (94.3 kg)  05/20/17 212 lb 12.8 oz (96.5 kg)      Other studies Reviewed: Additional studies/ records that were reviewed today include:   DCCV notes Review of the above records demonstrates:     ASSESSMENT AND PLAN:  ATRIAL FIB: Mr. Riley Moore has a CHA2DS2 - VASc score of 1.   He did not maintain NSR.  We had a discussion today.  He is going to stop drinking and is going to work with Riley Moore, Riley Blackwater, MD.  I will stop antioagulation for now.  When he has stopped drinking he will call us.  At that point I will send him back to Atrial Fibrillation Clinic.  He could then be started back on anticoagulation and then later on Flecainide with then plans in a couple of weeks after that for another cardioversion.   ETOH USE: He is going to to discuss this with Riley Moore, Riley Blackwater, MD.    HTN:  The blood pressure is at target. No change in medications is indicated. We will continue with therapeutic lifestyle changes (TLC).   Current medicines are reviewed at length with the patient today.  The patient does not have concerns regarding medicines.  The following changes  have been made:  As above  Labs/ tests ordered today include:   None  Orders Placed This Encounter  Procedures  . EKG 12-Lead     Disposition:   FU with me after he has had his next cardioversion or 6 months.   Signed, Minus Breeding, MD  07/01/2017 1:23 PM    [place]

## 2017-07-01 ENCOUNTER — Encounter: Payer: Self-pay | Admitting: Cardiology

## 2017-07-01 ENCOUNTER — Ambulatory Visit: Payer: 59 | Admitting: Cardiology

## 2017-07-01 VITALS — BP 128/68 | HR 75 | Ht 74.0 in | Wt 210.0 lb

## 2017-07-01 DIAGNOSIS — I4819 Other persistent atrial fibrillation: Secondary | ICD-10-CM

## 2017-07-01 DIAGNOSIS — I481 Persistent atrial fibrillation: Secondary | ICD-10-CM | POA: Diagnosis not present

## 2017-07-01 NOTE — Patient Instructions (Signed)
Medication Instructions:  STOP- Xarelto  If you need a refill on your cardiac medications before your next appointment, please call your pharmacy.  Labwork: None Ordered   Testing/Procedures: None Ordered  Follow-Up: Your physician wants you to follow-up in: 6 Months. You should receive a reminder letter in the mail two months in advance. If you do not receive a letter, please call our office [phone].    Thank you for choosing [facility] at [facility]!!

## 2017-07-03 ENCOUNTER — Encounter: Payer: Self-pay | Admitting: Family Medicine

## 2017-08-12 ENCOUNTER — Other Ambulatory Visit: Payer: Self-pay | Admitting: Family Medicine

## 2017-08-27 ENCOUNTER — Telehealth: Payer: Self-pay | Admitting: *Deleted

## 2017-08-27 ENCOUNTER — Other Ambulatory Visit: Payer: Self-pay | Admitting: Family Medicine

## 2017-08-27 MED ORDER — CLOTRIMAZOLE-BETAMETHASONE 1-0.05 % EX CREA: 1.0000 "application " | TOPICAL_CREAM | Freq: Two times a day (BID) | CUTANEOUS | 2 refills | Status: DC

## 2017-08-27 NOTE — Telephone Encounter (Signed)
Pt called to ask about the status of the Rx, call pt to advise

## 2017-08-27 NOTE — Telephone Encounter (Signed)
Pt came into the office today request refill on clotrimazole/betamethasone cream.   Medication not on med list.   Please advise. Thanks.

## 2017-08-27 NOTE — Telephone Encounter (Signed)
Pt advised and voiced understanding.   

## 2017-08-27 NOTE — Telephone Encounter (Signed)
I just eRx'd this.

## 2018-02-10 ENCOUNTER — Other Ambulatory Visit: Payer: Self-pay | Admitting: Family Medicine

## 2018-05-04 DIAGNOSIS — M79672 Pain in left foot: Secondary | ICD-10-CM

## 2018-05-04 HISTORY — DX: Pain in left foot: M79.672

## 2018-05-08 ENCOUNTER — Other Ambulatory Visit: Payer: Self-pay | Admitting: Family Medicine

## 2018-05-11 ENCOUNTER — Other Ambulatory Visit: Payer: Self-pay | Admitting: Family Medicine

## 2018-05-13 NOTE — Telephone Encounter (Signed)
Pt advised and voiced understanding.   He will call back to schedule an apt.

## 2018-06-01 ENCOUNTER — Other Ambulatory Visit: Payer: Self-pay | Admitting: Family Medicine

## 2018-06-10 ENCOUNTER — Encounter: Payer: Self-pay | Admitting: Family Medicine

## 2018-06-10 ENCOUNTER — Ambulatory Visit: Payer: 59 | Admitting: Family Medicine

## 2018-06-10 VITALS — BP 133/94 | HR 67 | Temp 97.9°F | Resp 16 | Ht 74.0 in | Wt 208.5 lb

## 2018-06-10 DIAGNOSIS — Z23 Encounter for immunization: Secondary | ICD-10-CM | POA: Diagnosis not present

## 2018-06-10 DIAGNOSIS — R7301 Impaired fasting glucose: Secondary | ICD-10-CM | POA: Diagnosis not present

## 2018-06-10 DIAGNOSIS — I10 Essential (primary) hypertension: Secondary | ICD-10-CM | POA: Diagnosis not present

## 2018-06-10 DIAGNOSIS — F411 Generalized anxiety disorder: Secondary | ICD-10-CM

## 2018-06-10 DIAGNOSIS — E7800 Pure hypercholesterolemia, unspecified: Secondary | ICD-10-CM

## 2018-06-10 DIAGNOSIS — R740 Nonspecific elevation of levels of transaminase and lactic acid dehydrogenase [LDH]: Secondary | ICD-10-CM

## 2018-06-10 DIAGNOSIS — E663 Overweight: Secondary | ICD-10-CM

## 2018-06-10 DIAGNOSIS — F1010 Alcohol abuse, uncomplicated: Secondary | ICD-10-CM | POA: Diagnosis not present

## 2018-06-10 DIAGNOSIS — R7401 Elevation of levels of liver transaminase levels: Secondary | ICD-10-CM

## 2018-06-10 LAB — COMPREHENSIVE METABOLIC PANEL
ALK PHOS: 59 U/L (ref 39–117)
ALT: 56 U/L — ABNORMAL HIGH (ref 0–53)
AST: 31 U/L (ref 0–37)
Albumin: 4.6 g/dL (ref 3.5–5.2)
BUN: 18 mg/dL (ref 6–23)
CO2: 27 mEq/L (ref 19–32)
Calcium: 9.7 mg/dL (ref 8.4–10.5)
Chloride: 102 mEq/L (ref 96–112)
Creatinine, Ser: 0.81 mg/dL (ref 0.40–1.50)
GFR: 99.39 mL/min (ref >60.00)
GLUCOSE: 100 mg/dL — AB (ref 70–99)
POTASSIUM: 4.6 meq/L (ref 3.5–5.1)
Sodium: 139 mEq/L (ref 135–145)
Total Bilirubin: 0.7 mg/dL (ref 0.2–1.2)
Total Protein: 7.2 g/dL (ref 6.0–8.3)

## 2018-06-10 LAB — CBC WITH DIFFERENTIAL/PLATELET
Basophils Absolute: 0.1 10*3/uL (ref 0.0–0.1)
Basophils Relative: 1 % (ref 0.0–3.0)
Eosinophils Absolute: 0.2 10*3/uL (ref 0.0–0.7)
Eosinophils Relative: 3.2 % (ref 0.0–5.0)
HCT: 47.8 % (ref 39.0–52.0)
Hemoglobin: 16.3 g/dL (ref 13.0–17.0)
LYMPHS ABS: 2.2 10*3/uL (ref 0.7–4.0)
LYMPHS PCT: 29.7 % (ref 12.0–46.0)
MCHC: 34.2 g/dL (ref 30.0–36.0)
MCV: 91.7 fl (ref 78.0–100.0)
MONOS PCT: 8.9 % (ref 3.0–12.0)
Monocytes Absolute: 0.7 10*3/uL (ref 0.1–1.0)
NEUTROS PCT: 57.2 % (ref 43.0–77.0)
Neutro Abs: 4.3 10*3/uL (ref 1.4–7.7)
PLATELETS: 201 10*3/uL (ref 150.0–400.0)
RBC: 5.21 Mil/uL (ref 4.22–5.81)
RDW: 13.2 % (ref 11.5–15.5)
WBC: 7.5 10*3/uL (ref 4.0–10.5)

## 2018-06-10 LAB — HEMOGLOBIN A1C: Hgb A1c MFr Bld: 5.7 % (ref 4.6–6.5)

## 2018-06-10 LAB — LIPID PANEL
CHOLESTEROL: 197 mg/dL (ref 0–200)
HDL: 31.9 mg/dL — ABNORMAL LOW (ref >39.00)
NONHDL: 165.26
Total CHOL/HDL Ratio: 6
Triglycerides: 237 mg/dL — ABNORMAL HIGH (ref 0.0–149.0)
VLDL: 47.4 mg/dL — ABNORMAL HIGH (ref 0.0–40.0)

## 2018-06-10 LAB — TSH: TSH: 2.02 u[IU]/mL (ref 0.35–4.50)

## 2018-06-10 LAB — LDL CHOLESTEROL, DIRECT: Direct LDL: 126 mg/dL

## 2018-06-10 MED ORDER — DILTIAZEM HCL ER COATED BEADS 180 MG PO CP24: 180.0000 mg | ORAL_CAPSULE | Freq: Every day | ORAL | 1 refills | Status: DC

## 2018-06-10 MED ORDER — LISINOPRIL 40 MG PO TABS: 40.0000 mg | ORAL_TABLET | Freq: Every day | ORAL | 1 refills | Status: DC

## 2018-06-10 MED ORDER — LORAZEPAM 0.5 MG PO TABS: ORAL_TABLET | ORAL | 0 refills | Status: DC

## 2018-06-10 MED ORDER — METOPROLOL TARTRATE 50 MG PO TABS: 50.0000 mg | ORAL_TABLET | Freq: Two times a day (BID) | ORAL | 1 refills | Status: DC

## 2018-06-10 MED ORDER — ESCITALOPRAM OXALATE 10 MG PO TABS: 10.0000 mg | ORAL_TABLET | Freq: Every day | ORAL | 0 refills | Status: DC

## 2018-06-10 NOTE — Progress Notes (Signed)
OFFICE VISIT  06/10/2018   CC:  Chief Complaint  Patient presents with  . Follow-up    RCI, pt is fasting.     HPI:    Patient is a 53 y.o. Caucasian male who presents for f/u HTN, metabolic syndrome, and persistent a-fib. He has undergone DCCV for his a-fib 06/2017 but he reverted back to a-fib shortly after. Cardiology d/c'd anticoagulation b/c pt abusing alcohol, and pt agreed to try quitting.  If quits then they'll consider returning to a-fib clinic for discussion of repeat DCCV or medical CV and restart of anticoagulation.  Doing ok overall.  Still drinking 6 pack a night. Worries all the time about everything, long hx of this.  Irritable, keyed up, can't concentrate, sleep is fine. Appetite fine.  Denies depression or hopelessness or anhedonia.  In remote past, was on duloxetine--"zombie", same with paxil.   He doesn't smoke.  No falls or syncope. Eating  Better and exercising more.  ROS: no CP, no SOB, no wheezing, no cough, no dizziness, no HAs, no rashes, no melena/hematochezia.  No polyuria or polydipsia.  No myalgias or arthralgias.   Past Medical History:  Diagnosis Date  . Anxiety attack   . Elevated transaminase level    suspect combo of hyperlip/fatty liver + alcohol.  Viral Hep panel neg 2016.  Marland Kitchen History of gluten intolerance   . Hyperlipemia    Intolerant of statins: tolerates welchol  . Hypertension   . Impaired fasting glucose 03/2014  . Metabolic syndrome    IFG, low HDL, elev trigs: nutritionist referral 04/2014 but patient did not show for appointment.  Marland Kitchen Perennial allergic rhinitis   . Persistent atrial fibrillation 2013; 2019   Dr. Percival Spanish.  Pt spontaneously converted in ED.  Outpt eval showed normal ECHO--ASA qd and BB/CCB started.  Recurrence (asympt) 2019--needs cardioversion per cards, started on xarelto, echo fine, DC cardioversion not successful 06/11/17-- Needs anxiety med, stop self-medicating with ETOH--cards stopped anticoag until pt  consistently stops ETOH.    Past Surgical History:  Procedure Laterality Date  . CARDIOVERSION N/A 06/11/2017   Procedure: CARDIOVERSION;  Surgeon: Skeet Latch, MD;  Location: [facility];  Service: Cardiovascular;  Laterality: N/A;  . DC cardioversion  06/11/2017   was back in A-fib within 5 min of waking up.  . TRANSTHORACIC ECHOCARDIOGRAM  04/2012   NORMAL (ED 55-65%)    Outpatient Medications Prior to Visit  Medication Sig Dispense Refill  . Ascorbic Acid (VITAMIN C PO) Take 1 tablet by mouth daily.    . cetirizine (ZYRTEC) 10 MG tablet Take 10 mg by mouth at bedtime.     . clotrimazole-betamethasone (LOTRISONE) cream Apply 1 application topically 2 (two) times daily. 45 g 2  . diltiazem (CARDIZEM CD) 180 MG 24 hr capsule Take 1 capsule (180 mg total) by mouth daily. OFFICE VISIT NEEDED 30 capsule 0  . lisinopril (PRINIVIL,ZESTRIL) 40 MG tablet Take 1 tablet (40 mg total) by mouth daily. OFFICE VISIT NEEDED 30 tablet 0  . metoprolol tartrate (LOPRESSOR) 50 MG tablet Take 1 tablet (50 mg total) by mouth 2 (two) times daily. OFFICE VISIT NEEDED 60 tablet 0  . Omega-3 Fatty Acids (FISH OIL) 1000 MG CPDR Take 1 capsule by mouth 2 (two) times daily.    Javier Docker Oil 1000 MG CAPS Take 1,000 mg by mouth 2 (two) times daily.    . rivaroxaban (XARELTO) 20 MG TABS tablet Take 1 tablet (20 mg total) by mouth daily with supper. (Patient not taking:  Reported on 06/10/2018) 30 tablet 11   No facility-administered medications prior to visit.     No Known Allergies  ROS As per HPI  PE: Blood pressure (!) 133/94, pulse 67, temperature 97.9 F (36.6 C), temperature source Oral, resp. rate 16, height 6\' 2"  (1.88 m), weight 208 lb 8 oz (94.6 kg), SpO2 100 %. Gen: Alert, well appearing.  Patient is oriented to person, place, time, and situation. KDT:OIZT: no injection, icteris, swelling, or exudate.  EOMI, PERRLA. Mouth: lips without lesion/swelling.  Oral mucosa pink and moist. Oropharynx  without erythema, exudate, or swelling.  CV: Irreg Irreg rhythm, rate 70 or so, no m/r/g. LUNGS: CTA bilat, nonlabored. EXT: no clubbing or cyanosis.  no edema.  SKIN: no jaundice or pallor.  LABS:  Lab Results  Component Value Date   TSH 2.150 06/09/2017   Lab Results  Component Value Date   WBC 6.8 06/09/2017   HGB 15.6 06/09/2017   HCT 44.7 06/09/2017   MCV 92 06/09/2017   PLT 193 06/09/2017   Lab Results  Component Value Date   CREATININE 0.83 06/09/2017   BUN 16 06/09/2017   NA 137 06/09/2017   K 4.8 06/09/2017   CL 99 06/09/2017   CO2 24 06/09/2017   Lab Results  Component Value Date   ALT 47 (H) 04/26/2017   AST 31 04/26/2017   ALKPHOS 55 04/02/2016   BILITOT 1.0 04/26/2017   Lab Results  Component Value Date   CHOL 200 (H) 04/26/2017   Lab Results  Component Value Date   HDL 35 (L) 04/26/2017   Lab Results  Component Value Date   LDLCALC 133 (H) 04/26/2017   Lab Results  Component Value Date   TRIG 186 (H) 04/26/2017   Lab Results  Component Value Date   CHOLHDL 5.7 (H) 04/26/2017   Lab Results  Component Value Date   PSA 0.33 10/30/2014   Lab Results  Component Value Date   HGBA1C 5.6 04/26/2017    IMPRESSION AND PLAN:  1) HTN: The current medical regimen is effective;  continue present plan and medications. Lytes/cr today.  2) A-fib, rate controlled.  Pt does not feel his a-fib. As per HPI-->no anticoag until he has proven he is no longer abusing alcohol. TSH and lytes today.  3) Alcohol abuse: discussed weening down over the next 4-6 wks. He has been self medicating his anxiety with this, so hopefully we can transition off the alcohol as we get his anxiety better with antidepressant (see below).  4) GAD: failed dulox and paxil.  Start trial of lexapro 10mg  qd.  Therapeutic expectations and side effect profile of medication discussed today.  Patient's questions answered. Also, will try lorazepam 0.5mg , 1-2 qd prn severe anxiety.   Warned pt against using this med at night with his alcohol--to dangerous.  He expressed understanding.  5) Metabolic syndrome: needs to work on [place]. FLP, fasting gluc, HbA1c today.  6) Preventative health: flu vaccine and shingrix #1 today.  An After Visit Summary was printed and given to the patient.  FOLLOW UP: Return in about 4 weeks (around 07/08/2018) for f/u anx/med/alc.  Signed:  Crissie Sickles, MD           06/10/2018

## 2018-06-10 NOTE — Addendum Note (Signed)
Addended by: Caroll Rancher L on: 06/10/2018 09:30 AM   Modules accepted: Orders

## 2018-06-10 NOTE — Addendum Note (Signed)
Addended by: Onalee Hua on: 06/10/2018 09:25 AM   Modules accepted: Orders

## 2018-06-16 ENCOUNTER — Ambulatory Visit (INDEPENDENT_AMBULATORY_CARE_PROVIDER_SITE_OTHER): Payer: 59

## 2018-06-16 ENCOUNTER — Ambulatory Visit: Payer: 59 | Admitting: Family Medicine

## 2018-06-16 ENCOUNTER — Encounter: Payer: Self-pay | Admitting: Family Medicine

## 2018-06-16 VITALS — BP 132/77 | HR 80 | Temp 98.2°F | Resp 16 | Ht 74.0 in | Wt 208.5 lb

## 2018-06-16 DIAGNOSIS — M79672 Pain in left foot: Secondary | ICD-10-CM | POA: Diagnosis not present

## 2018-06-16 DIAGNOSIS — E8881 Metabolic syndrome: Secondary | ICD-10-CM | POA: Diagnosis not present

## 2018-06-16 DIAGNOSIS — E782 Mixed hyperlipidemia: Secondary | ICD-10-CM

## 2018-06-16 MED ORDER — MELOXICAM 15 MG PO TABS: ORAL_TABLET | ORAL | 0 refills | Status: DC

## 2018-06-16 NOTE — Progress Notes (Signed)
OFFICE VISIT  06/16/2018   CC:  Chief Complaint  Patient presents with  . Foot Pain    left, no known injury     HPI:    Patient is a 53 y.o. Caucasian male who presents for left foot pain. Reviewed labs done 06/10/18 after I saw him for chronic illnesses f/u: essentially normal except mild mixed hyperlipidemia.  Left foot pain: onset of L foot pain 5 d/a, top of foot region, constant and worse with wt bearing. Intensity 6-8/10 and this is improved signif by ibuprofen 200-400mg  3-4 times a day. No redness or swelling in the area.  No tenderness. He did lift some things the day prior and had been walking a little more than usual in the preceding days.  However, no repetitive trauma like running or jumping lately. He has no hx of this type of foot pain.   Past Medical History:  Diagnosis Date  . Anxiety attack   . Elevated transaminase level    suspect combo of hyperlip/fatty liver + alcohol.  Viral Hep panel neg 2016.  Marland Kitchen Gout    multiple episodes (L MTP jt)  . History of gluten intolerance   . Hyperlipemia    Intolerant of statins: tolerates welchol  . Hypertension   . Impaired fasting glucose 03/2014  . Metabolic syndrome    IFG, low HDL, elev trigs: nutritionist referral 04/2014 but patient did not show for appointment.  Marland Kitchen Perennial allergic rhinitis   . Persistent atrial fibrillation 2013; 2019   Dr. Percival Spanish.  Pt spontaneously converted in ED.  Outpt eval showed normal ECHO--ASA qd and BB/CCB started.  Recurrence (asympt) 2019--needs cardioversion per cards, started on xarelto, echo fine, DC cardioversion not successful 06/11/17-- Needs anxiety med, stop self-medicating with ETOH--cards stopped anticoag until pt consistently stops ETOH.    Past Surgical History:  Procedure Laterality Date  . CARDIOVERSION N/A 06/11/2017   Procedure: CARDIOVERSION;  Surgeon: Skeet Latch, MD;  Location: [facility];  Service: Cardiovascular;  Laterality: N/A;  . DC cardioversion   06/11/2017   was back in A-fib within 5 min of waking up.  . TRANSTHORACIC ECHOCARDIOGRAM  04/2012   NORMAL (ED 55-65%)    Outpatient Medications Prior to Visit  Medication Sig Dispense Refill  . Ascorbic Acid (VITAMIN C PO) Take 1 tablet by mouth daily.    . cetirizine (ZYRTEC) 10 MG tablet Take 10 mg by mouth at bedtime.     . clotrimazole-betamethasone (LOTRISONE) cream Apply 1 application topically 2 (two) times daily. 45 g 2  . diltiazem (CARDIZEM CD) 180 MG 24 hr capsule Take 1 capsule (180 mg total) by mouth daily. 90 capsule 1  . escitalopram (LEXAPRO) 10 MG tablet Take 1 tablet (10 mg total) by mouth daily. 30 tablet 0  . lisinopril (PRINIVIL,ZESTRIL) 40 MG tablet Take 1 tablet (40 mg total) by mouth daily. 90 tablet 1  . LORazepam (ATIVAN) 0.5 MG tablet 1-2 tabs po qd prn severe anxiety 30 tablet 0  . metoprolol tartrate (LOPRESSOR) 50 MG tablet Take 1 tablet (50 mg total) by mouth 2 (two) times daily. 180 tablet 1  . Omega-3 Fatty Acids (FISH OIL) 1000 MG CPDR Take 1 capsule by mouth 2 (two) times daily.     No facility-administered medications prior to visit.     No Known Allergies  ROS As per HPI  PE: Blood pressure 132/77, pulse 80, temperature 98.2 F (36.8 C), temperature source Oral, resp. rate 16, height 6\' 2"  (1.88 m), weight 208  lb 8 oz (94.6 kg), SpO2 99 %. Body mass index is 26.77 kg/m.  Gen: Alert, well appearing.  Patient is oriented to person, place, time, and situation. AFFECT: pleasant, lucid thought and speech. Left leg is 1-2 cm longer than R when pt lies supine, but is equal to R when pt sits up. Left foot has usually high arch and mild dorsal bony prominence compared to R foot. No erythema, warmth, tenderness, or soft tissue swelling of foot or ankle. Mild pain on dorsum of left foot when foot dorsiflexion is resisted. No TTP on plantar surface anywhere.  LABS:  Lab Results  Component Value Date   TSH 2.02 06/10/2018   Lab Results  Component  Value Date   WBC 7.5 06/10/2018   HGB 16.3 06/10/2018   HCT 47.8 06/10/2018   MCV 91.7 06/10/2018   PLT 201.0 06/10/2018   Lab Results  Component Value Date   CREATININE 0.81 06/10/2018   BUN 18 06/10/2018   NA 139 06/10/2018   K 4.6 06/10/2018   CL 102 06/10/2018   CO2 27 06/10/2018   Lab Results  Component Value Date   ALT 56 (H) 06/10/2018   AST 31 06/10/2018   ALKPHOS 59 06/10/2018   BILITOT 0.7 06/10/2018   Lab Results  Component Value Date   CHOL 197 06/10/2018   Lab Results  Component Value Date   HDL 31.90 (L) 06/10/2018   Lab Results  Component Value Date   LDLCALC 133 (H) 04/26/2017   Lab Results  Component Value Date   TRIG 237.0 (H) 06/10/2018   Lab Results  Component Value Date   CHOLHDL 6 06/10/2018   Lab Results  Component Value Date   PSA 0.33 10/30/2014   Lab Results  Component Value Date   HGBA1C 5.7 06/10/2018   IMPRESSION AND PLAN:  1) Acute left foot pain. He has a couple of abnormalities that I would expect to have caused some left leg/foot complaints in the past if the findings were responsible for his current pain (L leg a bit longer than R and L arch signif higher than R). No sign of gout, effusion, or infection. Will check left foot x-ray to further eval for stress fracture.  If x-ray normal, plan is to treat with meloxicam 15mg  and ask podiatry to see him.  2) Metabolic syndrome: Lab work: reviewed recent fasting labs: all normal except mild mixed hyperlipidemia. Reassured pt.  He has hx of statin intolerance so we'll hold off on any meds, esp since he is committed to making some lifestyle changes that are more healthy (esp alcohol cessation). Answered questions today.  Pt expressed understanding of plan.  An After Visit Summary was printed and given to the patient.  FOLLOW UP: Return for as needed.Has f/u scheduled for 07/08/2018 already.  Signed:  Crissie Sickles, MD           06/16/2018

## 2018-06-17 ENCOUNTER — Other Ambulatory Visit: Payer: Self-pay | Admitting: Family Medicine

## 2018-06-17 DIAGNOSIS — M216X2 Other acquired deformities of left foot: Secondary | ICD-10-CM

## 2018-06-17 DIAGNOSIS — M79672 Pain in left foot: Secondary | ICD-10-CM

## 2018-06-28 ENCOUNTER — Ambulatory Visit: Payer: 59 | Admitting: Podiatry

## 2018-06-28 ENCOUNTER — Ambulatory Visit (INDEPENDENT_AMBULATORY_CARE_PROVIDER_SITE_OTHER): Payer: 59

## 2018-06-28 VITALS — BP 115/76 | HR 68

## 2018-06-28 DIAGNOSIS — M1000 Idiopathic gout, unspecified site: Secondary | ICD-10-CM

## 2018-06-28 DIAGNOSIS — M7732 Calcaneal spur, left foot: Secondary | ICD-10-CM

## 2018-06-28 DIAGNOSIS — M216X9 Other acquired deformities of unspecified foot: Secondary | ICD-10-CM

## 2018-06-28 DIAGNOSIS — M205X2 Other deformities of toe(s) (acquired), left foot: Secondary | ICD-10-CM | POA: Diagnosis not present

## 2018-06-28 DIAGNOSIS — M79672 Pain in left foot: Secondary | ICD-10-CM

## 2018-06-28 NOTE — Patient Instructions (Signed)

## 2018-06-29 ENCOUNTER — Other Ambulatory Visit: Payer: Self-pay | Admitting: Podiatry

## 2018-06-29 DIAGNOSIS — M7732 Calcaneal spur, left foot: Secondary | ICD-10-CM

## 2018-06-29 NOTE — Progress Notes (Signed)
Subjective:   Patient ID: Sharlyn Bologna, male   DOB: 53 y.o.   MRN: [id number]   HPI 53 year old male presents the office today for concerns of pain on top of his left foot.  He states it was severe about 3 weeks ago but the pain is now resolved.  He states that he does get some pain he points on the submetatarsal 1 area of the left foot.  Describes a sharp pain at times.  Also occasionally gets some pain to the back of his heel this is been ongoing intermittently for the last 3 years.  He has self diagnosis of gout to his big toe joint is not had a flare in several years.  He denies any recent injury or trauma.  He has been running and walking more trying to work out he is wearing a sketchers shoe.   Review of Systems  All other systems reviewed and are negative.  Past Medical History:  Diagnosis Date  . Anxiety attack   . Elevated transaminase level    suspect combo of hyperlip/fatty liver + alcohol.  Viral Hep panel neg 2016.  Marland Kitchen Gout    multiple episodes (L MTP jt)  . History of gluten intolerance   . Hyperlipemia    Intolerant of statins: tolerates welchol  . Hypertension   . Impaired fasting glucose 03/2014  . Metabolic syndrome    IFG, low HDL, elev trigs: nutritionist referral 04/2014 but patient did not show for appointment.  Marland Kitchen Perennial allergic rhinitis   . Persistent atrial fibrillation 2013; 2019   Dr. Percival Spanish.  Pt spontaneously converted in ED.  Outpt eval showed normal ECHO--ASA qd and BB/CCB started.  Recurrence (asympt) 2019--needs cardioversion per cards, started on xarelto, echo fine, DC cardioversion not successful 06/11/17-- Needs anxiety med, stop self-medicating with ETOH--cards stopped anticoag until pt consistently stops ETOH.    Past Surgical History:  Procedure Laterality Date  . CARDIOVERSION N/A 06/11/2017   Procedure: CARDIOVERSION;  Surgeon: Skeet Latch, MD;  Location: [place];  Service: Cardiovascular;  Laterality: N/A;  . DC cardioversion   06/11/2017   was back in A-fib within 5 min of waking up.  . TRANSTHORACIC ECHOCARDIOGRAM  04/2012   NORMAL (ED 55-65%)     Current Outpatient Medications:  .  Ascorbic Acid (VITAMIN C PO), Take 1 tablet by mouth daily., Disp: , Rfl:  .  cetirizine (ZYRTEC) 10 MG tablet, Take 10 mg by mouth at bedtime. , Disp: , Rfl:  .  clotrimazole-betamethasone (LOTRISONE) cream, Apply 1 application topically 2 (two) times daily., Disp: 45 g, Rfl: 2 .  diltiazem (CARDIZEM CD) 180 MG 24 hr capsule, Take 1 capsule (180 mg total) by mouth daily., Disp: 90 capsule, Rfl: 1 .  escitalopram (LEXAPRO) 10 MG tablet, Take 1 tablet (10 mg total) by mouth daily., Disp: 30 tablet, Rfl: 0 .  lisinopril (PRINIVIL,ZESTRIL) 40 MG tablet, Take 1 tablet (40 mg total) by mouth daily., Disp: 90 tablet, Rfl: 1 .  LORazepam (ATIVAN) 0.5 MG tablet, 1-2 tabs po qd prn severe anxiety, Disp: 30 tablet, Rfl: 0 .  meloxicam (MOBIC) 15 MG tablet, 1/2-1 tab po qd as needed for left foot pain, Disp: 30 tablet, Rfl: 0 .  metoprolol tartrate (LOPRESSOR) 50 MG tablet, Take 1 tablet (50 mg total) by mouth 2 (two) times daily., Disp: 180 tablet, Rfl: 1 .  Omega-3 Fatty Acids (FISH OIL) 1000 MG CPDR, Take 1 capsule by mouth 2 (two) times daily., Disp: , Rfl:  No Known Allergies        Objective:  Physical Exam  General: AAO x3, NAD  Dermatological: Skin is warm, dry and supple bilateral. Nails x 10 are well manicured; remaining integument appears unremarkable at this time. There are no open sores, no preulcerative lesions, no rash or signs of infection present.  Vascular: Dorsalis Pedis artery and Posterior Tibial artery pedal pulses are 2/4 bilateral with immedate capillary fill time. There is no pain with calf compression, swelling, warmth, erythema.   Neruologic: Grossly intact via light touch bilateral. Vibratory intact via tuning fork bilateral.  Patellar and Achilles deep tendon reflexes 2+ bilateral. No Babinski or clonus  noted bilateral.   Musculoskeletal: There is cavus foot deformity present with calcaneal varus present.  There is no area pinpoint tenderness or pain along the dorsal aspect of the midfoot.  There is a posterior heel spur palpable and this is where he gets some discomfort but currently not experiencing any pain.  Achilles tendon appears to be intact and Thompson test is negative.  No pain with lateral compression of calcaneus.  Mild decreased range of motion of the first MPJ.  Plantarflexed first ray.  Muscular strength 5/5 in all groups tested bilateral.  Gait: Unassisted, Nonantalgic.      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  Cavus foot type is present.  No evidence of acute fracture or stress fracture.  Posterior calcaneal spurring is evident. -At this time is not expensing any discomfort.  We discussed stretching exercises for the Achilles tendon to help take pressure off the posterior heel.  We discussed shoe modifications as well and he needs a more supportive shoe when running.  Also will check orthotic coverage and have him follow-up for inserts with Rick.  -Monitor for Symptoms and let me know.  If he gets flare we will check a uric acid level.  Trula Slade DPM

## 2018-07-01 ENCOUNTER — Telehealth: Payer: Self-pay | Admitting: Podiatry

## 2018-07-01 NOTE — Telephone Encounter (Signed)
Pt returned my call yesterday and left a message for me to call him back.  I returned call and left a message for pt to call me back to discuss orthotic coverage.

## 2018-07-05 ENCOUNTER — Other Ambulatory Visit: Payer: Self-pay | Admitting: Family Medicine

## 2018-07-05 NOTE — Telephone Encounter (Signed)
RF request for Lexapro LOV: 06/10/18 Next ov: none Last written: 06/10/18 #30 w/ 0 RF.  Patient was scheduled for 03/06 for follow up regarding this medication and cancelled.   Please advise, thanks.

## 2018-07-05 NOTE — Telephone Encounter (Signed)
I will authorize 30d supply, but pt needs office f/u prior to any further RF's.-thx

## 2018-07-06 NOTE — Telephone Encounter (Signed)
Contacted patient to let him know about scheduling f/u office visit for further refills. Patient stated he would call back to schedule

## 2018-07-08 ENCOUNTER — Ambulatory Visit: Payer: 59 | Admitting: Family Medicine

## 2018-07-08 ENCOUNTER — Other Ambulatory Visit: Payer: 59 | Admitting: Orthotics

## 2018-07-08 DIAGNOSIS — M216X1 Other acquired deformities of right foot: Secondary | ICD-10-CM

## 2018-07-08 DIAGNOSIS — M205X2 Other deformities of toe(s) (acquired), left foot: Secondary | ICD-10-CM | POA: Diagnosis not present

## 2018-08-01 ENCOUNTER — Other Ambulatory Visit: Payer: Self-pay | Admitting: Family Medicine

## 2018-08-01 NOTE — Telephone Encounter (Signed)
Contacted patient to make appointment for further refills of lexapro.  Patient unable to schedule at this time but will CB to schedule before he runs out of RX.  30 day supply sent to pharmacy, no refills.

## 2018-08-07 ENCOUNTER — Encounter: Payer: Self-pay | Admitting: Family Medicine

## 2018-08-31 ENCOUNTER — Other Ambulatory Visit: Payer: Self-pay | Admitting: Family Medicine

## 2018-08-31 NOTE — Telephone Encounter (Signed)
Contacted patient.  Patient over due for follow up, appt scheduled 09/02/2018.  Patient states he has enough medication until appt.

## 2018-09-02 ENCOUNTER — Encounter: Payer: Self-pay | Admitting: Family Medicine

## 2018-09-02 ENCOUNTER — Ambulatory Visit (INDEPENDENT_AMBULATORY_CARE_PROVIDER_SITE_OTHER): Payer: 59 | Admitting: Family Medicine

## 2018-09-02 DIAGNOSIS — F411 Generalized anxiety disorder: Secondary | ICD-10-CM

## 2018-09-02 DIAGNOSIS — I10 Essential (primary) hypertension: Secondary | ICD-10-CM

## 2018-09-02 DIAGNOSIS — F1010 Alcohol abuse, uncomplicated: Secondary | ICD-10-CM | POA: Diagnosis not present

## 2018-09-02 NOTE — Progress Notes (Signed)
Virtual Visit via Video Note  I connected with pt on 09/02/18 at  3:00 PM EDT by a video enabled telemedicine application and verified that I am speaking with the correct person using two identifiers.  Location patient: home Location provider:work or home office Persons participating in the virtual visit: patient, provider  I discussed the limitations of evaluation and management by telemedicine and the availability of in person appointments. The patient expressed understanding and agreed to proceed.  Telemedicine visit is a necessity given the COVID-19 restrictions in place at the current time.  HPI: 53 y/o WM being seen today for f/u chronic generalized anxiety, hx of alcohol abuse in attempts to self medicate this. Has HTN, metabolic syndrome, PAF. Has been on rate control and anticoagulant in the past, eventually was taken off his anticoagulant b/c of ongoing alcohol abuse.  Cardioversion has been unsuccessful x 1. He will be considered for anticoag in the future if he is able to stop abusing alcohol with any consistency.  One 06/10/18 f/u I started him on lexapro 10mg  qd and rx'd lorazepam 0.5mg , 1-2 qd prn anxiety, and the plan was for him to gradually cut back on alcohol intake over the next 4-6 wks. PMP AWARE reviewed and this shows the alpraz filled 06/10/18 and no other date, which is appropriate.  Interim Hx: Says he has been doing better.   Anxiety level has come down about 50% over the last 10 wks or so. Initially felt some decreased motivation/increased laziness on lexapro, then he felt this lift and he has been doing a lot better.  No panic.  No depressed mood. He is working still, things at home are fine. Has cut back some on alcohol : drinks 2-3 beers about 4-5 days per week.  This is down from about 5-6 beers nearly every day.  He admits he is still going to have to decrease this gradually. He still admits he wants to use it as a crutch.  Says home bp monitoring consistently  shows bp <130/80.  He has no info about heart rates. Says he is eating healthier the last several months, exercising more.  ROS: no palpitations, no dizziness, no sob, no CP, no focal weakness, no paresthesias.  Past Medical History:  Diagnosis Date  . Anxiety attack   . Elevated transaminase level    suspect combo of hyperlip/fatty liver + alcohol.  Viral Hep panel neg 2016.  Marland Kitchen Gout    multiple episodes (L MTP jt)  . History of gluten intolerance   . Hyperlipemia    Intolerant of statins: tolerates welchol  . Hypertension   . Impaired fasting glucose 03/2014  . 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 Metabolic syndrome    IFG, low HDL, elev trigs: nutritionist referral 04/2014 but patient did not show for appointment.  Marland Kitchen Perennial allergic rhinitis   . Persistent atrial fibrillation 2013; 2019   Dr. Percival Spanish.  Pt spontaneously converted in ED.  Outpt eval showed normal ECHO--ASA qd and BB/CCB started.  Recurrence (asympt) 2019--needs cardioversion per cards, started on xarelto, echo fine, DC cardioversion not successful 06/11/17-- Needs anxiety med, stop self-medicating with ETOH--cards stopped anticoag until pt consistently stops ETOH.    Past Surgical History:  Procedure Laterality Date  . CARDIOVERSION N/A 06/11/2017   Procedure: CARDIOVERSION;  Surgeon: Skeet Latch, MD;  Location: [place];  Service: Cardiovascular;  Laterality: N/A;  . DC cardioversion  06/11/2017  was back in A-fib within 5 min of waking up.  . TRANSTHORACIC ECHOCARDIOGRAM  04/2012   NORMAL (ED 55-65%)    Family History  Problem Relation Age of Onset  . Heart disease Father        pacemaker, atrial fibrillation  . 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 +Alcohol abuse.   Current Outpatient Medications:  .  Ascorbic Acid (VITAMIN C PO), Take 1 tablet by mouth daily., Disp: , Rfl:   .  cetirizine (ZYRTEC) 10 MG tablet, Take 10 mg by mouth at bedtime. , Disp: , Rfl:  .  clotrimazole-betamethasone (LOTRISONE) cream, Apply 1 application topically 2 (two) times daily., Disp: 45 g, Rfl: 2 .  diltiazem (CARDIZEM CD) 180 MG 24 hr capsule, Take 1 capsule (180 mg total) by mouth daily., Disp: 90 capsule, Rfl: 1 .  escitalopram (LEXAPRO) 10 MG tablet, TAKE 1 TABLET BY MOUTH EVERY DAY, Disp: 30 tablet, Rfl: 0 .  lisinopril (PRINIVIL,ZESTRIL) 40 MG tablet, Take 1 tablet (40 mg total) by mouth daily., Disp: 90 tablet, Rfl: 1 .  metoprolol tartrate (LOPRESSOR) 50 MG tablet, Take 1 tablet (50 mg total) by mouth 2 (two) times daily., Disp: 180 tablet, Rfl: 1 .  Omega-3 Fatty Acids (FISH OIL) 1000 MG CPDR, Take 1 capsule by mouth 2 (two) times daily., Disp: , Rfl:  .  LORazepam (ATIVAN) 0.5 MG tablet, 1-2 tabs po qd prn severe anxiety (Patient not taking: Reported on 09/02/2018), Disp: 30 tablet, Rfl: 0 .  meloxicam (MOBIC) 15 MG tablet, 1/2-1 tab po qd as needed for left foot pain (Patient not taking: Reported on 09/02/2018), Disp: 30 tablet, Rfl: 0  EXAM:  VITALS per patient if applicable: There were no vitals taken for this visit.   GENERAL: alert, oriented, appears well and in no acute distress  HEENT: atraumatic, conjunttiva clear, no obvious abnormalities on inspection of external nose and ears  NECK: normal movements of the head and neck  LUNGS: on inspection no signs of respiratory distress, breathing rate appears normal, no obvious gross SOB, gasping or wheezing  CV: no obvious cyanosis  MS: moves all visible extremities without noticeable abnormality  PSYCH/NEURO: pleasant and cooperative, no obvious depression or anxiety, speech and thought processing grossly intact  LABS: none today  Lab Results  Component Value Date   TSH 2.02 06/10/2018   Lab Results  Component Value Date   WBC 7.5 06/10/2018   HGB 16.3 06/10/2018   HCT 47.8 06/10/2018   MCV 91.7 06/10/2018    PLT 201.0 06/10/2018   Lab Results  Component Value Date   CREATININE 0.81 06/10/2018   BUN 18 06/10/2018   NA 139 06/10/2018   K 4.6 06/10/2018   CL 102 06/10/2018   CO2 27 06/10/2018   Lab Results  Component Value Date   ALT 56 (H) 06/10/2018   AST 31 06/10/2018   ALKPHOS 59 06/10/2018   BILITOT 0.7 06/10/2018   Lab Results  Component Value Date   CHOL 197 06/10/2018   Lab Results  Component Value Date   HDL 31.90 (L) 06/10/2018   Lab Results  Component Value Date   LDLCALC 133 (H) 04/26/2017   Lab Results  Component Value Date   TRIG 237.0 (H) 06/10/2018   Lab Results  Component Value Date   CHOLHDL 6 06/10/2018   Lab Results  Component Value Date   PSA 0.33 10/30/2014   Lab Results  Component Value Date  HGBA1C 5.7 06/10/2018    ASSESSMENT AND PLAN:  Discussed the following assessment and plan:  1) GAD: improved 50% on lexapro 10 mg over the last 10 wks. Recommended increase to 20mg  qd dosing today but pt declined, wants to hold off for now and try to continue to cut back on ETOH.  He has NOT had to use lorazepam at all yet.  2) ETOH abuse: self medicating his anxiety.  This is slowly getting better the longer he is on lexapro. Encouraged pt to continue cutting back and would like to see him cut out alcohol altogether, but at least needs to get down to no more than 1-2 beers a couple days per week to be considered for anticoag and/or repeat DCCV for his a-fib in the future.  3) HTN: The current medical regimen is effective;  continue present plan and medications.   Recheck CMET, A1c, FLP, and PSA at CPE in 4 mo.  I discussed the assessment and treatment plan with the patient. The patient was provided an opportunity to ask questions and all were answered. The patient agreed with the plan and demonstrated an understanding of the instructions.   The patient was advised to call back or seek an in-person evaluation if the symptoms worsen or if the  condition fails to improve as anticipated.  F/u: 4 mo cpe fasting  Signed:  Crissie Sickles, MD           09/02/2018

## 2018-09-09 ENCOUNTER — Telehealth: Payer: Self-pay | Admitting: Cardiology

## 2018-09-09 ENCOUNTER — Other Ambulatory Visit: Payer: Self-pay | Admitting: Family Medicine

## 2018-09-09 NOTE — Telephone Encounter (Signed)
LMTCB to schedule follow up appt with Dr. Percival Spanish, called patient from recall list.

## 2018-11-08 ENCOUNTER — Other Ambulatory Visit: Payer: Self-pay

## 2018-11-08 MED ORDER — CLOTRIMAZOLE-BETAMETHASONE 1-0.05 % EX CREA: 1.0000 "application " | TOPICAL_CREAM | Freq: Two times a day (BID) | CUTANEOUS | 2 refills | Status: DC

## 2018-11-22 ENCOUNTER — Telehealth: Payer: Self-pay

## 2018-11-22 NOTE — Telephone Encounter (Signed)
7-23 @740AM  [facility]  COVID-19 Pre-Screening Questions:  . In the past 7 to 10 days have you had a cough,  shortness of breath, headache, congestion, fever (100 or greater) body aches, chills, sore throat, or sudden loss of taste or sense of smell? NO . Have you been around anyone with known Covid 19. NO . Have you been around anyone who is awaiting Covid 19 test results in the past 7 to 10 days? NO . Have you been around anyone who has been exposed to Covid 19, or has mentioned symptoms of Covid 19 within the past 7 to 10 days? NO  PT WILL ARRIVE EARLY IN MASK AND NO VISITORS

## 2018-11-22 NOTE — Progress Notes (Signed)
Cardiology Office Note   Date:  11/24/2018   ID:  LOIS OSTROM, DOB 01/31/1966, MRN [id number]  PCP:  Tammi Sou, MD  Cardiologist:   Minus Breeding, MD Referring:  Tammi Sou, MD  Chief Complaint  Patient presents with  . Atrial Fibrillation      History of Present Illness: Riley Moore is a 53 y.o. male who presents for follow up of atrial fib.  He was seen initially in 2013 for this.  He had recurrence recently and was seen in the ED and in atrial fib clinic.  At that time he had chest pain.  It was noted that he drinks six to 8 beers per night.   He has a low embolic risk but Xarelto was used prior to attempted cardioversion.  He had an echo which was without significant findings.    He had cardioversion and initially went into NSR but before leaving the hospital he was back in fib.    He returns for follow up.  He stopped drinking alcohol.  He was supposed to come back to [place] after that so that we could consider trying an antiarrhythmic as we did not think he would help with his trigger of alcohol.  He did stop drinking and just now is coming back to the clinic.  He does not really feel his fibrillation.  He denies any chest pressure, neck or arm discomfort.  He has no palpitations, presyncope or syncope.  He has no weight gain or edema.  He wants to start running.   Past Medical History:  Diagnosis Date  . Anxiety attack   . Elevated transaminase level    suspect combo of hyperlip/fatty liver + alcohol.  Viral Hep panel neg 2016.  Marland Kitchen Gout    multiple episodes (L MTP jt)  . History of gluten intolerance   . Hyperlipemia    Intolerant of statins: tolerates welchol  . Hypertension   . Impaired fasting glucose 03/2014  . 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 Metabolic syndrome    IFG, low HDL, elev trigs: nutritionist referral 04/2014 but patient did not show for appointment.  Marland Kitchen  Perennial allergic rhinitis   . Persistent atrial fibrillation 2013; 2019   Dr. Percival Spanish.  Pt spontaneously converted in ED.  Outpt eval showed normal ECHO--ASA qd and BB/CCB started.  Recurrence (asympt) 2019--needs cardioversion per cards, started on xarelto, echo fine, DC cardioversion not successful 06/11/17-- Needs anxiety med, stop self-medicating with ETOH--cards stopped anticoag until pt consistently stops ETOH.    Past Surgical History:  Procedure Laterality Date  . CARDIOVERSION N/A 06/11/2017   Procedure: CARDIOVERSION;  Surgeon: Skeet Latch, MD;  Location: [place];  Service: Cardiovascular;  Laterality: N/A;  . DC cardioversion  06/11/2017   was back in A-fib within 5 min of waking up.  . TRANSTHORACIC ECHOCARDIOGRAM  04/2012   NORMAL (ED 55-65%)     Current Outpatient Medications  Medication Sig Dispense Refill  . Ascorbic Acid (VITAMIN C PO) Take 1 tablet by mouth daily.    . cetirizine (ZYRTEC) 10 MG tablet Take 10 mg by mouth at bedtime.     . clotrimazole-betamethasone (LOTRISONE) cream Apply 1 application topically 2 (two) times daily. 45 g 2  . diltiazem (CARDIZEM CD) 180 MG 24 hr capsule Take 1 capsule (180 mg total) by mouth daily. 90 capsule 1  . escitalopram (LEXAPRO) 10  MG tablet TAKE 1 TABLET BY MOUTH EVERY DAY 90 tablet 0  . lisinopril (PRINIVIL,ZESTRIL) 40 MG tablet Take 1 tablet (40 mg total) by mouth daily. 90 tablet 1  . LORazepam (ATIVAN) 0.5 MG tablet 1-2 tabs po qd prn severe anxiety 30 tablet 0  . meloxicam (MOBIC) 15 MG tablet 1/2-1 tab po qd as needed for left foot pain 30 tablet 0  . metoprolol tartrate (LOPRESSOR) 50 MG tablet Take 1 tablet (50 mg total) by mouth 2 (two) times daily. 180 tablet 1  . Omega-3 Fatty Acids (FISH OIL) 1000 MG CPDR Take 1 capsule by mouth 2 (two) times daily.    . flecainide (TAMBOCOR) 50 MG tablet Take 1 tablet (50 mg total) by mouth 2 (two) times daily. 60 tablet 11  . rivaroxaban (XARELTO) 20 MG TABS tablet Take  1 tablet (20 mg total) by mouth daily with supper. 30 tablet 11   No current facility-administered medications for this visit.     Allergies:   Patient has no known allergies.    ROS:  Please see the history of present illness.   Otherwise, review of systems are positive for none.   All other systems are reviewed and negative.    PHYSICAL EXAM: VS:  BP 125/79   Pulse (!) 53   Ht 6\' 2"  (1.88 m)   Wt 222 lb 3.2 oz (100.8 kg)   SpO2 97%   BMI 28.53 kg/m  , BMI Body mass index is 28.53 kg/m.  GENERAL:  Well appearing NECK:  No jugular venous distention, waveform within normal limits, carotid upstroke brisk and symmetric, no bruits, no thyromegaly LUNGS:  Clear to auscultation bilaterally CHEST:  Unremarkable HEART:  PMI not displaced or sustained,S1 and S2 within normal limits, no S3, no clicks, no rubs, no murmurs, irregular ABD:  Flat, positive bowel sounds normal in frequency in pitch, no bruits, no rebound, no guarding, no midline pulsatile mass, no hepatomegaly, no splenomegaly EXT:  2 plus pulses throughout, no edema, no cyanosis no clubbing    EKG:  EKG iordered today. The ekg ordered 05/06/17 demonstrates atrial fibrillation, rate 53, axis within normal limits, intervals within normal limits, no acute ST-T wave changes.   Recent Labs: 06/10/2018: ALT 56; BUN 18; Creatinine, Ser 0.81; Hemoglobin 16.3; Platelets 201.0; Potassium 4.6; Sodium 139; TSH 2.02    Lipid Panel    Component Value Date/Time   CHOL 197 06/10/2018 0916   TRIG 237.0 (H) 06/10/2018 0916   HDL 31.90 (L) 06/10/2018 0916   CHOLHDL 6 06/10/2018 0916   VLDL 47.4 (H) 06/10/2018 0916   LDLCALC 133 (H) 04/26/2017 0843   LDLDIRECT 126.0 06/10/2018 0916      Wt Readings from Last 3 Encounters:  11/24/18 222 lb 3.2 oz (100.8 kg)  06/16/18 208 lb 8 oz (94.6 kg)  06/10/18 208 lb 8 oz (94.6 kg)      Other studies Reviewed: Additional studies/ records that were reviewed today include:  EKG, echo Review  of the above records demonstrates:  NA   ASSESSMENT AND PLAN:  ATRIAL FIB: Riley Moore has a CHA2DS2 - VASc score of   1.  That he is going to need cardioversion since he stopped drinking I am going to start him back on Xarelto and then plan to start flecainide 50 mg twice daily.  We will start that in about 3 weeks.  A week after that we will have him come back to the [facility].  He  can have his dose of flecainide increased and then plan for cardioversion.   Ultimately if this fails would like to consider ablation.  He would not need Xarelto long-term at this point.  I will be checking labs today to include a basic metabolic profile and CBC  ETOH USE:   He has stopped drinking.  HTN:  The blood pressure is well controlled.  He will continue the meds as listed.    Current medicines are reviewed at length with the patient today.  The patient does not have concerns regarding medicines.  The following changes have been made: As above  Labs/ tests ordered today include:     Orders Placed This Encounter  Procedures  . Basic metabolic panel  . CBC  . Amb Referral to AFIB Clinic     Disposition:   FU with me after the cardioversion.    Signed, Minus Breeding, MD  11/24/2018 8:42 AM    [facility]

## 2018-11-24 ENCOUNTER — Other Ambulatory Visit: Payer: Self-pay

## 2018-11-24 ENCOUNTER — Ambulatory Visit (INDEPENDENT_AMBULATORY_CARE_PROVIDER_SITE_OTHER): Payer: BC Managed Care – PPO | Admitting: Cardiology

## 2018-11-24 ENCOUNTER — Encounter: Payer: Self-pay | Admitting: Cardiology

## 2018-11-24 VITALS — BP 125/79 | HR 53 | Ht 74.0 in | Wt 222.2 lb

## 2018-11-24 DIAGNOSIS — I10 Essential (primary) hypertension: Secondary | ICD-10-CM | POA: Diagnosis not present

## 2018-11-24 DIAGNOSIS — Z79899 Other long term (current) drug therapy: Secondary | ICD-10-CM | POA: Diagnosis not present

## 2018-11-24 DIAGNOSIS — I4819 Other persistent atrial fibrillation: Secondary | ICD-10-CM | POA: Diagnosis not present

## 2018-11-24 LAB — CBC
Hematocrit: 47.2 % (ref 37.5–51.0)
Hemoglobin: 15.9 g/dL (ref 13.0–17.7)
MCH: 30.8 pg (ref 26.6–33.0)
MCHC: 33.7 g/dL (ref 31.5–35.7)
MCV: 92 fL (ref 79–97)
Platelets: 166 10*3/uL (ref 150–450)
RBC: 5.16 x10E6/uL (ref 4.14–5.80)
RDW: 12.1 % (ref 11.6–15.4)
WBC: 7.5 10*3/uL (ref 3.4–10.8)

## 2018-11-24 LAB — BASIC METABOLIC PANEL
BUN/Creatinine Ratio: 18 (ref 9–20)
BUN: 15 mg/dL (ref 6–24)
CO2: 23 mmol/L (ref 20–29)
Calcium: 9.3 mg/dL (ref 8.7–10.2)
Chloride: 99 mmol/L (ref 96–106)
Creatinine, Ser: 0.85 mg/dL (ref 0.76–1.27)
GFR calc Af Amer: 114 mL/min/{1.73_m2} (ref >59)
GFR calc non Af Amer: 99 mL/min/{1.73_m2} (ref >59)
Glucose: 114 mg/dL — ABNORMAL HIGH (ref 65–99)
Potassium: 5 mmol/L (ref 3.5–5.2)
Sodium: 137 mmol/L (ref 134–144)

## 2018-11-24 MED ORDER — FLECAINIDE ACETATE 50 MG PO TABS: 50.0000 mg | ORAL_TABLET | Freq: Two times a day (BID) | ORAL | 11 refills | Status: DC

## 2018-11-24 MED ORDER — RIVAROXABAN 20 MG PO TABS: 20.0000 mg | ORAL_TABLET | Freq: Every day | ORAL | 11 refills | Status: DC

## 2018-11-24 NOTE — Patient Instructions (Signed)
Medication Instructions:  START XARELTO 20MG  DAILY  IN 3 WEEKS (12-15-2018) START FLECAINIDE 50MG  TWICE DAILY  If you need a refill on your cardiac medications before your next appointment, please call your pharmacy.  Labwork: BMET AND CBC TODAY HERE IN OUR OFFICE AT [facility]    Take the provided lab slips with you to the lab for your blood draw.   When you have your labs (blood work) drawn today and your tests are completely normal, you will receive your results only by MyChart Message (if you have MyChart) -OR-  A paper copy in the mail.  If you have any lab test that is abnormal or we need to change your treatment, we will call you to review these results.  Special Instructions: You will need an appointment WITH THE AFIB CLINIC IN 4 WEEKS(12-25-2018)  Follow-Up: You will need a follow up appointment in AFTER AFIB CLINIC. You may see Minus Breeding, MD   or one of the following Advanced Practice Providers on your designated Care Team:  Rosaria Ferries, PA-C  Jory Sims, DNP, ANP     At [facility], you and your health needs are our priority.  As part of our continuing mission to provide you with exceptional heart care, we have created designated Provider Care Teams.  These Care Teams include your primary Cardiologist (physician) and Advanced Practice Providers (APPs -  Physician Assistants and Nurse Practitioners) who all work together to provide you with the care you need, when you need it.  Thank you for choosing [facility] at [facility]!!

## 2018-11-25 NOTE — Addendum Note (Signed)
Addended by: Therisa Doyne on: 11/25/2018 02:27 PM   Modules accepted: Orders

## 2018-11-30 ENCOUNTER — Other Ambulatory Visit: Payer: Self-pay | Admitting: Family Medicine

## 2018-12-08 ENCOUNTER — Other Ambulatory Visit: Payer: Self-pay

## 2018-12-08 ENCOUNTER — Ambulatory Visit (HOSPITAL_COMMUNITY)
Admission: RE | Admit: 2018-12-08 | Discharge: 2018-12-08 | Disposition: A | Discharge status: Home / Self Care | Payer: BC Managed Care – PPO | Source: Ambulatory Visit | Attending: Nurse Practitioner | Admitting: Nurse Practitioner

## 2018-12-08 ENCOUNTER — Encounter (HOSPITAL_COMMUNITY): Payer: Self-pay | Admitting: Nurse Practitioner

## 2018-12-08 VITALS — BP 126/78 | HR 75 | Ht 74.0 in | Wt 223.0 lb

## 2018-12-08 DIAGNOSIS — I4819 Other persistent atrial fibrillation: Secondary | ICD-10-CM

## 2018-12-08 DIAGNOSIS — I10 Essential (primary) hypertension: Secondary | ICD-10-CM | POA: Insufficient documentation

## 2018-12-08 DIAGNOSIS — E8881 Metabolic syndrome: Secondary | ICD-10-CM | POA: Diagnosis not present

## 2018-12-08 DIAGNOSIS — Z79899 Other long term (current) drug therapy: Secondary | ICD-10-CM | POA: Diagnosis not present

## 2018-12-08 DIAGNOSIS — F419 Anxiety disorder, unspecified: Secondary | ICD-10-CM | POA: Diagnosis not present

## 2018-12-08 DIAGNOSIS — E785 Hyperlipidemia, unspecified: Secondary | ICD-10-CM | POA: Diagnosis not present

## 2018-12-08 DIAGNOSIS — I4891 Unspecified atrial fibrillation: Secondary | ICD-10-CM | POA: Diagnosis not present

## 2018-12-08 DIAGNOSIS — Z8249 Family history of ischemic heart disease and other diseases of the circulatory system: Secondary | ICD-10-CM | POA: Diagnosis not present

## 2018-12-08 MED ORDER — FLECAINIDE ACETATE 50 MG PO TABS: 50.0000 mg | ORAL_TABLET | Freq: Two times a day (BID) | ORAL | 11 refills | Status: DC

## 2018-12-08 NOTE — Progress Notes (Signed)
Primary Care Physician: Tammi Sou, MD Referring Physician: [facility] ER f/u   Riley Moore is a 53 y.o. male with a h/o afib first onset in 2013. He did well for a number of years, staying in [place], but recently found to be in [place] in December 27,2018, when he went to the ER for chest pain. His chest pain resolved but he continued to be in afib. It was rate controlled. He was unaware. He did not drink large amounts of caffeine but stated that he drank  a six pack a night and may increase to 8 on the weekends for anxiety. He reported  that he would have panic attacks if he cut  down on his beer intake. He has been on ASA daily, diltiazem and metoprolol for years. Chadsvasc score is 1 for HTN.  When I initially saw him, I was concerned to start anticoagulation with his alcoholism and wanted him to see his PCP for means of treating his anxiety, other than with alcohol. He was pending a f/u with Dr. Percival Spanish within the month.   He was started on anticoagulation and cardioverted, however he had ERAF before leaving the hospital. He was told to come back for an antiarrythmic when he was able to stop alcohol. He just did f/u with Dr. Percival Spanish again 11/24/18. He has stopped alcohol a while back and wanted to try to get back in rhythm. The plan was for pt to start anticoagulation x 3 weeks and then start flecainide 50 mg bid. I was to see pt a few days after starting flecainide. the pt has not started xarelto yet but does plan to start 8/10. Hence,  I am seeing pt prematurely in the midst of this plan.  He states that he is still  asymptomatic with afib and is rate controlled. If he fails AAD/DCCV, he wants to pursue ablation.  Today, he denies symptoms of palpitations, chest pain, shortness of breath, orthopnea, PND, lower extremity edema, dizziness, presyncope, syncope, or neurologic sequela. The patient is tolerating medications without difficulties and is otherwise without complaint today.   Past Medical  History:  Diagnosis Date  . Anxiety attack   . Elevated transaminase level    suspect combo of hyperlip/fatty liver + alcohol.  Viral Hep panel neg 2016.  Marland Kitchen Gout    multiple episodes (L MTP jt)  . History of gluten intolerance   . Hyperlipemia    Intolerant of statins: tolerates welchol  . Hypertension   . Impaired fasting glucose 03/2014  . 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 Metabolic syndrome    IFG, low HDL, elev trigs: nutritionist referral 04/2014 but patient did not show for appointment.  Marland Kitchen Perennial allergic rhinitis   . Persistent atrial fibrillation 2013; 2019   Dr. Percival Spanish.  Pt spontaneously converted in ED.  Outpt eval showed normal ECHO--ASA qd and BB/CCB started.  Recurrence (asympt) 2019--needs cardioversion per cards, started on xarelto, echo fine, DC cardioversion not successful 06/11/17-- Needs anxiety med, stop self-medicating with ETOH--cards stopped anticoag until pt consistently stops ETOH.   Past Surgical History:  Procedure Laterality Date  . CARDIOVERSION N/A 06/11/2017   Procedure: CARDIOVERSION;  Surgeon: Skeet Latch, MD;  Location: [place];  Service: Cardiovascular;  Laterality: N/A;  . DC cardioversion  06/11/2017   was back in A-fib within 5 min of waking up.  . TRANSTHORACIC ECHOCARDIOGRAM  04/2012   NORMAL (  ED 55-65%)    Current Outpatient Medications  Medication Sig Dispense Refill  . Ascorbic Acid (VITAMIN C PO) Take 1 tablet by mouth daily.    . cetirizine (ZYRTEC) 10 MG tablet Take 10 mg by mouth at bedtime.     . clotrimazole-betamethasone (LOTRISONE) cream Apply 1 application topically 2 (two) times daily. 45 g 2  . diltiazem (CARDIZEM CD) 180 MG 24 hr capsule Take 1 capsule (180 mg total) by mouth daily. 90 capsule 1  . escitalopram (LEXAPRO) 10 MG tablet TAKE 1 TABLET BY MOUTH EVERY DAY 90 tablet 0  . flecainide (TAMBOCOR) 50 MG tablet Take 1 tablet (50  mg total) by mouth 2 (two) times daily. 60 tablet 11  . lisinopril (PRINIVIL,ZESTRIL) 40 MG tablet Take 1 tablet (40 mg total) by mouth daily. 90 tablet 1  . LORazepam (ATIVAN) 0.5 MG tablet 1-2 tabs po qd prn severe anxiety 30 tablet 0  . meloxicam (MOBIC) 15 MG tablet 1/2-1 tab po qd as needed for left foot pain 30 tablet 0  . metoprolol tartrate (LOPRESSOR) 50 MG tablet Take 1 tablet (50 mg total) by mouth 2 (two) times daily. 180 tablet 1  . Omega-3 Fatty Acids (FISH OIL) 1000 MG CPDR Take 1 capsule by mouth 2 (two) times daily.    . rivaroxaban (XARELTO) 20 MG TABS tablet Take 1 tablet (20 mg total) by mouth daily with supper. (Patient not taking: Reported on 12/08/2018) 30 tablet 11   No current facility-administered medications for this encounter.     No Known Allergies  Social History   Socioeconomic History  . Marital status: Single    Spouse name: Not on file  . Number of children: 2  . Years of education: Not on file  . Highest education level: Not on file  Occupational History  . Occupation: Chief Financial Officer  Social Needs  . Financial resource strain: Not on file  . Food insecurity    Worry: Not on file    Inability: Not on file  . Transportation needs    Medical: Not on file    Non-medical: Not on file  Tobacco Use  . Smoking status: Never Smoker  . Smokeless tobacco: Never Used  Substance and Sexual Activity  . Alcohol use: Yes    Alcohol/week: 4.0 - 5.0 standard drinks    Types: 4 - 5 Cans of beer per week    Comment: daily  . Drug use: No  . Sexual activity: Not on file  Lifestyle  . Physical activity    Days per week: Not on file    Minutes per session: Not on file  . Stress: Not on file  Relationships  . Social Herbalist on phone: Not on file    Gets together: Not on file    Attends religious service: Not on file    Active member of club or organization: Not on file    Attends meetings of clubs or organizations: Not on file     Relationship status: Not on file  . Intimate partner violence    Fear of current or ex partner: Not on file    Emotionally abused: Not on file    Physically abused: Not on file    Forced sexual activity: Not on file  Other Topics Concern  . Not on file  Social History Narrative   Lives at home with wife and two children.     Occupation: Chief Financial Officer.   No tob or drugs.  Alc: 4 beers per day.  Denies hx of alc problems/abuse.  However, he says he tried quitting in the past and after a few days he started to have "panic attacks".    Family History  Problem Relation Age of Onset  . Heart disease Father        pacemaker, atrial fibrillation  . Hypertension Father     ROS- All systems are reviewed and negative except as per the HPI above  Physical Exam: Vitals:   12/08/18 1530  BP: 126/78  Pulse: 75  Weight: 101.2 kg  Height: 6\' 2"  (1.88 m)   Wt Readings from Last 3 Encounters:  12/08/18 101.2 kg  11/24/18 100.8 kg  06/16/18 94.6 kg    Labs: Lab Results  Component Value Date   NA 137 11/24/2018   K 5.0 11/24/2018   CL 99 11/24/2018   CO2 23 11/24/2018   GLUCOSE 114 (H) 11/24/2018   BUN 15 11/24/2018   CREATININE 0.85 11/24/2018   CALCIUM 9.3 11/24/2018   No results found for: INR Lab Results  Component Value Date   CHOL 197 06/10/2018   HDL 31.90 (L) 06/10/2018   LDLCALC 133 (H) 04/26/2017   TRIG 237.0 (H) 06/10/2018     GEN- The patient is well appearing, alert and oriented x 3 today.   Head- normocephalic, atraumatic Eyes-  Sclera clear, conjunctiva pink Ears- hearing intact Oropharynx- clear Neck- supple, no JVP Lymph- no cervical lymphadenopathy Lungs- Clear to ausculation bilaterally, normal work of breathing Heart- irregular rate and rhythm, no murmurs, rubs or gallops, PMI not laterally displaced GI- soft, NT, ND, + BS Extremities- no clubbing, cyanosis, or edema MS- no significant deformity or atrophy Skin- no rash or lesion Psych-  euthymic mood, full affect Neuro- strength and sensation are intact  EKG-afib at 75 bpm, qrs int 78 ms, qtc 413 ms Echo-2013-Study Conclusions  - Left ventricle: The cavity size was normal. Wall thickness was increased in a pattern of mild LVH. Systolic function was normal. The estimated ejection fraction was in the range of 55% to 65%. Wall motion was normal; there were no regional wall motion abnormalities. Left ventricular diastolic function parameters were normal. - Atrial septum: No defect or patent foramen ovale was identified.     Assessment and Plan: 1. Persistent  asymptomatic afib He will follow Dr. Rosezella [place] plan and start xarelto 20 mg 8/10 pm.  He will start flecainide 50 mg bid on 8/31, to allow for full anticoagulation in case of a chemical cardioversion I am checking with PharmD as there may be an interaction with  lexapro and flecainde I will contact the pt if this changes the plan  I will see back for EKG on 9/3 for ekg  on AAD and increase to 100 mg bid and then plan on cardioversion if uninterrupted anticoagultion Chadsvasc score of 1, will need anticoagulation for 4 weeks after DCCV, then anticipate stopping xarelto  Continue BB/CCB at current doses  2. HTN  Stable   Marice Guidone C. Dala Breault, [facility] [address] [place], [place] [address] [phone]

## 2018-12-08 NOTE — Patient Instructions (Signed)
Start Xarelto 20mg  once a day with supper on Monday, August 10th  On August 31st - start flecainide 50mg  twice a day --

## 2018-12-09 ENCOUNTER — Other Ambulatory Visit: Payer: Self-pay

## 2018-12-09 MED ORDER — ESCITALOPRAM OXALATE 10 MG PO TABS: 10.0000 mg | ORAL_TABLET | Freq: Every day | ORAL | 0 refills | Status: DC

## 2018-12-14 ENCOUNTER — Telehealth: Payer: Self-pay | Admitting: Family Medicine

## 2018-12-14 NOTE — Telephone Encounter (Signed)
Pt is requesting a refill for   metoprolol tartrate (LOPRESSOR) 50 MG tablet  LORazepam (ATIVAN) 0.5 MG tablet lisinopril (PRINIVIL,ZESTRIL) 40 MG tablet   Pharmacy:  [id number] - [place], [place] [phone] (Phone) [phone] (Fax)

## 2018-12-15 ENCOUNTER — Other Ambulatory Visit: Payer: Self-pay

## 2018-12-15 ENCOUNTER — Other Ambulatory Visit: Payer: Self-pay | Admitting: Family Medicine

## 2018-12-15 MED ORDER — METOPROLOL TARTRATE 50 MG PO TABS: 50.0000 mg | ORAL_TABLET | Freq: Two times a day (BID) | ORAL | 1 refills | Status: DC

## 2018-12-15 MED ORDER — LISINOPRIL 40 MG PO TABS: 40.0000 mg | ORAL_TABLET | Freq: Every day | ORAL | 1 refills | Status: DC

## 2018-12-15 NOTE — Telephone Encounter (Signed)
RF request for Lorazepam LOV: 09/02/18 Next ov: advised to f/u 4 months Last written: 06/10/18 (30,0) No CSC or UDS  Please advise, thanks. Refills sent for Metoprolol and Lisinopril

## 2018-12-16 MED ORDER — LORAZEPAM 0.5 MG PO TABS: ORAL_TABLET | ORAL | 0 refills | Status: DC

## 2018-12-16 NOTE — Telephone Encounter (Signed)
Lorazepam eRx'd

## 2018-12-28 ENCOUNTER — Encounter: Payer: Self-pay | Admitting: Family Medicine

## 2019-01-05 ENCOUNTER — Other Ambulatory Visit: Payer: Self-pay

## 2019-01-05 ENCOUNTER — Encounter (HOSPITAL_COMMUNITY): Payer: Self-pay | Admitting: Nurse Practitioner

## 2019-01-05 ENCOUNTER — Ambulatory Visit (HOSPITAL_COMMUNITY)
Admission: RE | Admit: 2019-01-05 | Discharge: 2019-01-05 | Disposition: A | Discharge status: Home / Self Care | Payer: BC Managed Care – PPO | Source: Ambulatory Visit | Attending: Nurse Practitioner | Admitting: Nurse Practitioner

## 2019-01-05 VITALS — BP 122/80 | HR 83 | Ht 74.0 in | Wt 221.8 lb

## 2019-01-05 DIAGNOSIS — I4892 Unspecified atrial flutter: Secondary | ICD-10-CM | POA: Diagnosis not present

## 2019-01-05 DIAGNOSIS — E785 Hyperlipidemia, unspecified: Secondary | ICD-10-CM | POA: Diagnosis not present

## 2019-01-05 DIAGNOSIS — M109 Gout, unspecified: Secondary | ICD-10-CM | POA: Insufficient documentation

## 2019-01-05 DIAGNOSIS — I4819 Other persistent atrial fibrillation: Secondary | ICD-10-CM | POA: Diagnosis not present

## 2019-01-05 DIAGNOSIS — Z7901 Long term (current) use of anticoagulants: Secondary | ICD-10-CM | POA: Insufficient documentation

## 2019-01-05 DIAGNOSIS — I10 Essential (primary) hypertension: Secondary | ICD-10-CM | POA: Insufficient documentation

## 2019-01-05 DIAGNOSIS — Z79899 Other long term (current) drug therapy: Secondary | ICD-10-CM | POA: Diagnosis not present

## 2019-01-05 MED ORDER — FLECAINIDE ACETATE 50 MG PO TABS: 100.0000 mg | ORAL_TABLET | Freq: Two times a day (BID) | ORAL | 11 refills | Status: DC

## 2019-01-05 NOTE — Patient Instructions (Signed)
Increase flecainide to 100mg  twice a day on Saturday

## 2019-01-05 NOTE — Progress Notes (Signed)
Primary Care Physician: Tammi Sou, MD Referring Physician: [facility] ER f/u   Riley Moore is a 53 y.o. male with a h/o afib first onset in 2013. He did well for a number of years, staying in [place], but recently found to be in [place] in December 27,2018, when he went to the ER for chest pain. His chest pain resolved but he continued to be in afib. It was rate controlled. He was unaware. He did not drink large amounts of caffeine but stated that he drank  a six pack a night and may increase to 8 on the weekends for anxiety. He reported  that he would have panic attacks if he cut  down on his beer intake. He has been on ASA daily, diltiazem and metoprolol for years. Chadsvasc score is 1 for HTN.  When I initially saw him, I was concerned to start anticoagulation with his alcoholism and wanted him to see his PCP for means of treating his anxiety, other than with alcohol. He was pending a f/u with Dr. Percival Spanish within the month.   He was started on anticoagulation and cardioverted, however he had ERAF before leaving the hospital. He was told to come back for an antiarrythmic when he was able to stop alcohol. He just did f/u with Dr. Percival Spanish again 11/24/18. He has stopped alcohol a while back and wanted to try to get back in rhythm. The plan was for pt to start anticoagulation x 3 weeks and then start flecainide 50 mg bid. I was to see pt a few days after starting flecainide. the pt has not started xarelto yet but does plan to start 8/10. Hence,  I am seeing pt prematurely in the midst of this plan.  He states that he is still  asymptomatic with afib and is rate controlled. If he fails AAD/DCCV, he wants to pursue ablation.  F/u in afib clinic, 9/3. He is now on flecainide 50 mg bid. He remains in atrial flutter at 83 bpm. He is tolerating flecainide, feels no different on drug. No missed doses of xarelto since starting.   Today, he denies symptoms of palpitations, chest pain, shortness of breath,  orthopnea, PND, lower extremity edema, dizziness, presyncope, syncope, or neurologic sequela. The patient is tolerating medications without difficulties and is otherwise without complaint today.   Past Medical History:  Diagnosis Date  . Anxiety attack   . Elevated transaminase level    suspect combo of hyperlip/fatty liver + alcohol.  Viral Hep panel neg 2016.  Marland Kitchen Gout    multiple episodes (L MTP jt)  . History of gluten intolerance   . Hyperlipemia    Intolerant of statins: tolerates welchol  . Hypertension   . Impaired fasting glucose 03/2014  . 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 Metabolic syndrome    IFG, low HDL, elev trigs: nutritionist referral 04/2014 but patient did not show for appointment.  Marland Kitchen Perennial allergic rhinitis   . Persistent atrial fibrillation 2013; 2019   Dr. Percival Spanish.  Spont conv in ED.  Outpt eval showed nl ECHO--ASA qd and BB/CCB started.  Recurrence (asympt) 2019--needs cardioversion per cards, started on xarelto, echo fine, DC cardioversion not successful 06/11/17-- Needs anxiety med, stop self-med with ETOH--cards d/c'd anticoag until stops ETOH. He stopped ETOH, back on xarelto and flecain 11/2018->plan for CV again.   Past Surgical History:  Procedure Laterality Date  . CARDIOVERSION  N/A 06/11/2017   Procedure: CARDIOVERSION;  Surgeon: Skeet Latch, MD;  Location: [place];  Service: Cardiovascular;  Laterality: N/A;  . DC cardioversion  06/11/2017   was back in A-fib within 5 min of waking up.  . TRANSTHORACIC ECHOCARDIOGRAM  04/2012   NORMAL (ED 55-65%)    Current Outpatient Medications  Medication Sig Dispense Refill  . Ascorbic Acid (VITAMIN C PO) Take 1 tablet by mouth daily.    . cetirizine (ZYRTEC) 10 MG tablet Take 10 mg by mouth at bedtime.     . clotrimazole-betamethasone (LOTRISONE) cream Apply 1 application topically 2 (two) times daily. 45 g 2  .  diltiazem (CARDIZEM CD) 180 MG 24 hr capsule TAKE 1 CAPSULE BY MOUTH EVERY DAY 90 capsule 0  . escitalopram (LEXAPRO) 10 MG tablet Take 1 tablet (10 mg total) by mouth daily. 90 tablet 0  . flecainide (TAMBOCOR) 50 MG tablet Take 2 tablets (100 mg total) by mouth 2 (two) times daily. Start on August 31 60 tablet 11  . lisinopril (ZESTRIL) 40 MG tablet Take 1 tablet (40 mg total) by mouth daily. 90 tablet 1  . LORazepam (ATIVAN) 0.5 MG tablet 1-2 tabs po qd prn severe anxiety 30 tablet 0  . metoprolol tartrate (LOPRESSOR) 50 MG tablet Take 1 tablet (50 mg total) by mouth 2 (two) times daily. 180 tablet 1  . Omega-3 Fatty Acids (FISH OIL) 1000 MG CPDR Take 1 capsule by mouth 2 (two) times daily.    . rivaroxaban (XARELTO) 20 MG TABS tablet Take 1 tablet (20 mg total) by mouth daily with supper. 30 tablet 11   No current facility-administered medications for this encounter.     No Known Allergies  Social History   Socioeconomic History  . Marital status: Single    Spouse name: Not on file  . Number of children: 2  . Years of education: Not on file  . Highest education level: Not on file  Occupational History  . Occupation: Chief Financial Officer  Social Needs  . Financial resource strain: Not on file  . Food insecurity    Worry: Not on file    Inability: Not on file  . Transportation needs    Medical: Not on file    Non-medical: Not on file  Tobacco Use  . Smoking status: Never Smoker  . Smokeless tobacco: Never Used  Substance and Sexual Activity  . Alcohol use: Yes    Alcohol/week: 4.0 - 5.0 standard drinks    Types: 4 - 5 Cans of beer per week    Comment: daily  . Drug use: No  . Sexual activity: Not on file  Lifestyle  . Physical activity    Days per week: Not on file    Minutes per session: Not on file  . Stress: Not on file  Relationships  . Social Herbalist on phone: Not on file    Gets together: Not on file    Attends religious service: Not on file     Active member of club or organization: Not on file    Attends meetings of clubs or organizations: Not on file    Relationship status: Not on file  . Intimate partner violence    Fear of current or ex partner: Not on file    Emotionally abused: Not on file    Physically abused: Not on file    Forced sexual activity: Not on file  Other Topics Concern  . Not on file  Social History Narrative   Lives at home with wife and two children.     Occupation: Chief Financial Officer.   No tob or drugs.  Alc: 4 beers per day.  Denies hx of alc problems/abuse.  However, he says he tried quitting in the past and after a few days he started to have "panic attacks".    Family History  Problem Relation Age of Onset  . Heart disease Father        pacemaker, atrial fibrillation  . Hypertension Father     ROS- All systems are reviewed and negative except as per the HPI above  Physical Exam: Vitals:   01/05/19 1534  BP: 122/80  Pulse: 83  Weight: 100.6 kg  Height: 6\' 2"  (1.88 m)   Wt Readings from Last 3 Encounters:  01/05/19 100.6 kg  12/08/18 101.2 kg  11/24/18 100.8 kg    Labs: Lab Results  Component Value Date   NA 137 11/24/2018   K 5.0 11/24/2018   CL 99 11/24/2018   CO2 23 11/24/2018   GLUCOSE 114 (H) 11/24/2018   BUN 15 11/24/2018   CREATININE 0.85 11/24/2018   CALCIUM 9.3 11/24/2018   No results found for: INR Lab Results  Component Value Date   CHOL 197 06/10/2018   HDL 31.90 (L) 06/10/2018   LDLCALC 133 (H) 04/26/2017   TRIG 237.0 (H) 06/10/2018     GEN- The patient is well appearing, alert and oriented x 3 today.   Head- normocephalic, atraumatic Eyes-  Sclera clear, conjunctiva pink Ears- hearing intact Oropharynx- clear Neck- supple, no JVP Lymph- no cervical lymphadenopathy Lungs- Clear to ausculation bilaterally, normal work of breathing Heart- irregular rate and rhythm, no murmurs, rubs or gallops, PMI not laterally displaced GI- soft, NT, ND, + BS  Extremities- no clubbing, cyanosis, or edema MS- no significant deformity or atrophy Skin- no rash or lesion Psych- euthymic mood, full affect Neuro- strength and sensation are intact  EKG- a flutter at 83 bpm,  qrs int 86 ms, qtc 423 ms Echo-2013-Study Conclusions  - Left ventricle: The cavity size was normal. Wall thickness was increased in a pattern of mild LVH. Systolic function was normal. The estimated ejection fraction was in the range of 55% to 65%. Wall motion was normal; there were no regional wall motion abnormalities. Left ventricular diastolic function parameters were normal. - Atrial septum: No defect or patent foramen ovale was identified.   Assessment and Plan: 1. Persistent  asymptomatic afib/flutter  Increase  flecainide 100 mg bid  Saturday am and f/u in afib clinic on Tuesday for repeat ekg and plans for cardioversion if no adverse ekg changes  Chadsvasc score of 1, will need anticoagulation for 4 weeks after DCCV, then anticipate stopping xarelto, states no missed doses Continue BB/CCB at current doses  2. HTN  Stable   F/u 9/8  Riley Moore. Riley Moore, [facility] [address] [place], [place] [address] [phone]

## 2019-01-10 ENCOUNTER — Other Ambulatory Visit: Payer: Self-pay

## 2019-01-10 ENCOUNTER — Ambulatory Visit (HOSPITAL_COMMUNITY)
Admission: RE | Admit: 2019-01-10 | Discharge: 2019-01-10 | Disposition: A | Discharge status: Home / Self Care | Payer: BC Managed Care – PPO | Source: Ambulatory Visit | Attending: Nurse Practitioner | Admitting: Nurse Practitioner

## 2019-01-10 DIAGNOSIS — I4892 Unspecified atrial flutter: Secondary | ICD-10-CM | POA: Diagnosis not present

## 2019-01-10 DIAGNOSIS — Z01818 Encounter for other preprocedural examination: Secondary | ICD-10-CM | POA: Insufficient documentation

## 2019-01-10 DIAGNOSIS — I4891 Unspecified atrial fibrillation: Secondary | ICD-10-CM | POA: Insufficient documentation

## 2019-01-10 LAB — BASIC METABOLIC PANEL
Anion gap: 9 (ref 5–15)
BUN: 20 mg/dL (ref 6–20)
CO2: 24 mmol/L (ref 22–32)
Calcium: 8.8 mg/dL — ABNORMAL LOW (ref 8.9–10.3)
Chloride: 104 mmol/L (ref 98–111)
Creatinine, Ser: 1.12 mg/dL (ref 0.61–1.24)
GFR calc Af Amer: >60 mL/min (ref >60)
GFR calc non Af Amer: >60 mL/min (ref >60)
Glucose, Bld: 109 mg/dL — ABNORMAL HIGH (ref 70–99)
Potassium: 4.3 mmol/L (ref 3.5–5.1)
Sodium: 137 mmol/L (ref 135–145)

## 2019-01-10 LAB — CBC
HCT: 45.5 % (ref 39.0–52.0)
Hemoglobin: 15.4 g/dL (ref 13.0–17.0)
MCH: 31.7 pg (ref 26.0–34.0)
MCHC: 33.8 g/dL (ref 30.0–36.0)
MCV: 93.6 fL (ref 80.0–100.0)
Platelets: 169 10*3/uL (ref 150–400)
RBC: 4.86 MIL/uL (ref 4.22–5.81)
RDW: 12.3 % (ref 11.5–15.5)
WBC: 9.4 10*3/uL (ref 4.0–10.5)
nRBC: 0 % (ref 0.0–0.2)

## 2019-01-10 NOTE — Patient Instructions (Signed)
Cardioversion scheduled for Monday, September 10th  - Arrive at the [facility] and go to admitting at [facility] not eat or drink anything after midnight the night prior to your procedure.  - Take all your morning medication with a sip of water prior to arrival.  - You will not be able to drive home after your procedure.

## 2019-01-10 NOTE — Progress Notes (Signed)
Pt returns for follow up ECG after increasing flecainide. Pt remains in atrial flutter HR 98 with variable conduction, motion artifact, QRS 88, QTc 416. Will check Bmet/CBC today and arrange for DCCV. Will also scheduled follow up in AF clinic one week post DCCV.

## 2019-01-10 NOTE — H&P (View-Only) (Signed)
Pt returns for follow up ECG after increasing flecainide. Pt remains in atrial flutter HR 98 with variable conduction, motion artifact, QRS 88, QTc 416. Will check Bmet/CBC today and arrange for DCCV. Will also scheduled follow up in AF clinic one week post DCCV.

## 2019-01-19 ENCOUNTER — Other Ambulatory Visit (HOSPITAL_COMMUNITY)
Admission: RE | Admit: 2019-01-19 | Discharge: 2019-01-19 | Disposition: A | Discharge status: Home / Self Care | Payer: BC Managed Care – PPO | Source: Ambulatory Visit | Attending: Cardiology | Admitting: Cardiology

## 2019-01-19 ENCOUNTER — Encounter: Payer: Self-pay | Admitting: Family Medicine

## 2019-01-19 DIAGNOSIS — Z01812 Encounter for preprocedural laboratory examination: Secondary | ICD-10-CM | POA: Diagnosis not present

## 2019-01-19 DIAGNOSIS — Z20828 Contact with and (suspected) exposure to other viral communicable diseases: Secondary | ICD-10-CM | POA: Diagnosis not present

## 2019-01-20 LAB — NOVEL CORONAVIRUS, NAA (HOSP ORDER, SEND-OUT TO REF LAB; TAT 18-24 HRS): SARS-CoV-2, NAA: NOT DETECTED

## 2019-01-23 ENCOUNTER — Encounter (HOSPITAL_COMMUNITY)
Admission: RE | Disposition: A | Discharge status: Home / Self Care | Payer: BC Managed Care – PPO | Source: Home / Self Care | Attending: Cardiology

## 2019-01-23 ENCOUNTER — Ambulatory Visit (HOSPITAL_COMMUNITY)
Admission: RE | Admit: 2019-01-23 | Discharge: 2019-01-23 | Disposition: A | Discharge status: Home / Self Care | Payer: BC Managed Care – PPO | Attending: Cardiology | Admitting: Cardiology

## 2019-01-23 ENCOUNTER — Ambulatory Visit (HOSPITAL_COMMUNITY): Payer: BC Managed Care – PPO | Admitting: Anesthesiology

## 2019-01-23 ENCOUNTER — Encounter (HOSPITAL_COMMUNITY): Payer: Self-pay

## 2019-01-23 ENCOUNTER — Other Ambulatory Visit: Payer: Self-pay

## 2019-01-23 DIAGNOSIS — M109 Gout, unspecified: Secondary | ICD-10-CM | POA: Insufficient documentation

## 2019-01-23 DIAGNOSIS — E785 Hyperlipidemia, unspecified: Secondary | ICD-10-CM | POA: Diagnosis not present

## 2019-01-23 DIAGNOSIS — Z79899 Other long term (current) drug therapy: Secondary | ICD-10-CM | POA: Insufficient documentation

## 2019-01-23 DIAGNOSIS — I4891 Unspecified atrial fibrillation: Secondary | ICD-10-CM | POA: Diagnosis not present

## 2019-01-23 DIAGNOSIS — I10 Essential (primary) hypertension: Secondary | ICD-10-CM | POA: Insufficient documentation

## 2019-01-23 DIAGNOSIS — I4892 Unspecified atrial flutter: Secondary | ICD-10-CM | POA: Diagnosis not present

## 2019-01-23 DIAGNOSIS — I483 Typical atrial flutter: Secondary | ICD-10-CM | POA: Diagnosis not present

## 2019-01-23 HISTORY — PX: CARDIOVERSION: SHX1299

## 2019-01-23 SURGERY — CARDIOVERSION
Anesthesia: General

## 2019-01-23 MED ORDER — LIDOCAINE 2% (20 MG/ML) 5 ML SYRINGE
INTRAMUSCULAR | Status: DC | PRN
  Administered 2019-01-23: 100 mg via INTRAVENOUS

## 2019-01-23 MED ORDER — PROPOFOL 10 MG/ML IV BOLUS
INTRAVENOUS | Status: DC | PRN
  Administered 2019-01-23: 40 mg via INTRAVENOUS
  Administered 2019-01-23: 90 mg via INTRAVENOUS
  Administered 2019-01-23: 30 mg via INTRAVENOUS

## 2019-01-23 MED ORDER — SODIUM CHLORIDE 0.9 % IV SOLN
INTRAVENOUS | Status: DC | PRN
  Administered 2019-01-23: 09:00:00 via INTRAVENOUS

## 2019-01-23 NOTE — Interval H&P Note (Signed)
History and Physical Interval Note:  01/23/2019 9:22 AM  Riley Moore  has presented today for surgery, with the diagnosis of A-FIB.  The various methods of treatment have been discussed with the patient and family. After consideration of risks, benefits and other options for treatment, the patient has consented to  Procedure(s): CARDIOVERSION (N/A) as a surgical intervention.  The patient's history has been reviewed, patient examined, no change in status, stable for surgery.  I have reviewed the patient's chart and labs.  Questions were answered to the patient's satisfaction.     Risa Auman Harrell Gave

## 2019-01-23 NOTE — Discharge Instructions (Signed)
Your heart rate was slow after the procedure, so we recommend holding your metoprolol until you follow up with the afib clinic.   Electrical Cardioversion, Care After This sheet gives you information about how to care for yourself after your procedure. Your health care provider may also give you more specific instructions. If you have problems or questions, contact your health care provider. What can I expect after the procedure? After the procedure, it is common to have:  Some redness on the skin where the shocks were given. Follow these instructions at home:   Do not drive for 24 hours if you were given a medicine to help you relax (sedative).  Take over-the-counter and prescription medicines only as told by your health care provider.  Ask your health care provider how to check your pulse. Check it often.  Rest for 48 hours after the procedure or as told by your health care provider.  Avoid or limit your caffeine use as told by your health care provider. Contact a health care provider if:  You feel like your heart is beating too quickly or your pulse is not regular.  You have a serious muscle cramp that does not go away. Get help right away if:   You have discomfort in your chest.  You are dizzy or you feel faint.  You have trouble breathing or you are short of breath.  Your speech is slurred.  You have trouble moving an arm or leg on one side of your body.  Your fingers or toes turn cold or blue. This information is not intended to replace advice given to you by your health care provider. Make sure you discuss any questions you have with your health care provider. Document Released: 02/08/2013 Document Revised: 04/02/2017 Document Reviewed: 10/25/2015 Elsevier Patient Education  2020 [employer].

## 2019-01-23 NOTE — Transfer of Care (Signed)
Immediate Anesthesia Transfer of Care Note  Patient: Riley Moore  Procedure(s) Performed: CARDIOVERSION (N/A )  Patient Location: Endoscopy Unit  Anesthesia Type:General  Level of Consciousness: awake, alert  and oriented  Airway & Oxygen Therapy: Patient Spontanous Breathing and Patient connected to face mask oxygen  Post-op Assessment: Report given to RN and Post -op Vital signs reviewed and stable  Post vital signs: Reviewed and stable  Last Vitals:  Vitals Value Taken Time  BP 133/76 01/23/19 0939  Temp    Pulse 47 01/23/19 0940  Resp 16 01/23/19 0940  SpO2 98 % 01/23/19 0940    Last Pain:  Vitals:   01/23/19 0915  TempSrc: Oral  PainSc: 0-No pain         Complications: No apparent anesthesia complications

## 2019-01-23 NOTE — Anesthesia Postprocedure Evaluation (Signed)
Anesthesia Post Note  Patient: Riley Moore  Procedure(s) Performed: CARDIOVERSION (N/A )     Patient location during evaluation: Endoscopy Anesthesia Type: General Level of consciousness: awake and alert Pain management: pain level controlled Vital Signs Assessment: post-procedure vital signs reviewed and stable Respiratory status: spontaneous breathing, nonlabored ventilation, respiratory function stable and patient connected to nasal cannula oxygen Cardiovascular status: blood pressure returned to baseline and stable Postop Assessment: no apparent nausea or vomiting Anesthetic complications: no    Last Vitals:  Vitals:   01/23/19 0939 01/23/19 0940  BP: 133/76 (!) 127/96  Pulse: (!) 45 (!) 47  Resp: 14 16  Temp:  36.6 C  SpO2: 97% 98%    Last Pain:  Vitals:   01/23/19 0940  TempSrc: Oral  PainSc: 0-No pain                 [name]

## 2019-01-23 NOTE — CV Procedure (Signed)
Procedure:   DCCV  Indication:  Symptomatic atrial flutter  Procedure Note:  The patient signed informed consent.  They have had had therapeutic anticoagulation with rivaroxaban greater than 3 weeks.  Anesthesia was administered by Dr. Valma Cava.  Adequate airway was maintained throughout and vital followed per protocol.  They were cardioverted x 1 with 120J of biphasic synchronized energy.  They converted to sinus bradycardia.  There were no apparent complications.  The patient had normal neuro status and respiratory status post procedure with vitals stable as recorded elsewhere.    Follow up:  They will continue on current medical therapy and follow up with cardiology as scheduled.  Buford Dresser, MD PhD 01/23/2019 9:39 AM

## 2019-01-23 NOTE — Anesthesia Preprocedure Evaluation (Addendum)
Anesthesia Evaluation  Patient identified by MRN, date of birth, ID band Patient awake    Reviewed: Allergy & Precautions, NPO status , Patient's Chart, lab work & pertinent test results  Airway Mallampati: I  TM Distance: >3 FB Neck ROM: Full    Dental no notable dental hx. (+) Teeth Intact   Pulmonary neg pulmonary ROS,    Pulmonary exam normal breath sounds clear to auscultation       Cardiovascular hypertension, Pt. on medications Normal cardiovascular exam+ dysrhythmias Atrial Fibrillation  Rhythm:Irregular Rate:Abnormal     Neuro/Psych negative neurological ROS     GI/Hepatic negative GI ROS, Neg liver ROS,   Endo/Other  negative endocrine ROS  Renal/GU negative Renal ROS     Musculoskeletal negative musculoskeletal ROS (+)   Abdominal   Peds  Hematology negative hematology ROS (+)   Anesthesia Other Findings   Reproductive/Obstetrics                            Anesthesia Physical Anesthesia Plan  ASA: II  Anesthesia Plan: General   Post-op Pain Management:    Induction: Intravenous  PONV Risk Score and Plan: Treatment may vary due to age or medical condition  Airway Management Planned: Natural Airway  Additional Equipment:   Intra-op Plan:   Post-operative Plan:   Informed Consent: I have reviewed the patients History and Physical, chart, labs and discussed the procedure including the risks, benefits and alternatives for the proposed anesthesia with the patient or authorized representative who has indicated his/her understanding and acceptance.     Dental advisory given  Plan Discussed with: Anesthesiologist and CRNA  Anesthesia Plan Comments:         Anesthesia Quick Evaluation

## 2019-01-25 ENCOUNTER — Encounter (HOSPITAL_COMMUNITY): Payer: Self-pay | Admitting: Cardiology

## 2019-02-01 ENCOUNTER — Other Ambulatory Visit: Payer: Self-pay

## 2019-02-01 ENCOUNTER — Ambulatory Visit (HOSPITAL_COMMUNITY)
Admission: RE | Admit: 2019-02-01 | Discharge: 2019-02-01 | Disposition: A | Discharge status: Home / Self Care | Payer: BC Managed Care – PPO | Source: Ambulatory Visit | Attending: Nurse Practitioner | Admitting: Nurse Practitioner

## 2019-02-01 ENCOUNTER — Encounter (HOSPITAL_COMMUNITY): Payer: Self-pay | Admitting: Nurse Practitioner

## 2019-02-01 VITALS — BP 118/88 | HR 97 | Ht 74.0 in | Wt 222.6 lb

## 2019-02-01 DIAGNOSIS — I4891 Unspecified atrial fibrillation: Secondary | ICD-10-CM | POA: Diagnosis not present

## 2019-02-01 DIAGNOSIS — I4819 Other persistent atrial fibrillation: Secondary | ICD-10-CM | POA: Insufficient documentation

## 2019-02-01 DIAGNOSIS — Z8249 Family history of ischemic heart disease and other diseases of the circulatory system: Secondary | ICD-10-CM | POA: Diagnosis not present

## 2019-02-01 DIAGNOSIS — Z79899 Other long term (current) drug therapy: Secondary | ICD-10-CM | POA: Insufficient documentation

## 2019-02-01 DIAGNOSIS — F419 Anxiety disorder, unspecified: Secondary | ICD-10-CM | POA: Diagnosis not present

## 2019-02-01 DIAGNOSIS — Z7901 Long term (current) use of anticoagulants: Secondary | ICD-10-CM | POA: Insufficient documentation

## 2019-02-01 DIAGNOSIS — Z7982 Long term (current) use of aspirin: Secondary | ICD-10-CM | POA: Insufficient documentation

## 2019-02-01 DIAGNOSIS — I4439 Other atrioventricular block: Secondary | ICD-10-CM | POA: Insufficient documentation

## 2019-02-01 DIAGNOSIS — E785 Hyperlipidemia, unspecified: Secondary | ICD-10-CM | POA: Insufficient documentation

## 2019-02-01 DIAGNOSIS — I10 Essential (primary) hypertension: Secondary | ICD-10-CM | POA: Insufficient documentation

## 2019-02-01 DIAGNOSIS — Z9889 Other specified postprocedural states: Secondary | ICD-10-CM | POA: Insufficient documentation

## 2019-02-01 DIAGNOSIS — I4892 Unspecified atrial flutter: Secondary | ICD-10-CM | POA: Insufficient documentation

## 2019-02-01 NOTE — Progress Notes (Signed)
Primary Care Physician: Tammi Sou, MD Referring Physician: [facility] ER f/u   Riley Moore is a 53 y.o. male with a h/o afib first onset in 2013. He did well for a number of years, staying in [place], but recently found to be in [place] in April 29, 2017, when he went to the ER for chest pain. His chest pain resolved but he continued to be in afib. It was rate controlled. He was unaware. He did not drink large amounts of caffeine but stated that he drank  a six pack a night and may increase to 8 on the weekends for anxiety. He reported  that he would have panic attacks if he cut  down on his beer intake. He has been on ASA daily, diltiazem and metoprolol for years. Chadsvasc score is 1 for HTN.  When I initially saw him, I was concerned to start anticoagulation with his alcoholism and wanted him to see his PCP for means of treating his anxiety, other than with alcohol. He was pending a f/u with Dr. Percival Spanish within the month.   He was started on anticoagulation and cardioverted, however he had ERAF before leaving the hospital. He was told to come back for an antiarrythmic when he was able to stop alcohol. He just did f/u with Dr. Percival Spanish again 11/24/18. He has stopped alcohol a while back and wanted to try to get back in rhythm. The plan was for pt to start anticoagulation x 3 weeks and then start flecainide 50 mg bid. I was to see pt a few days after starting flecainide. the pt has not started xarelto yet but does plan to start 8/10. Hence,  I am seeing pt prematurely in the midst of this plan.  He states that he is still  asymptomatic with afib and is rate controlled. If he fails AAD/DCCV, he wants to pursue ablation.  F/u in afib clinic, 9/3. He is now on flecainide 50 mg bid. He remains in atrial flutter at 83 bpm. He is tolerating flecainide, feels no different on drug. No missed doses of xarelto since starting.   He was increased to flecainide 100 mg bid and cardioverted, 9/21.. This was  successful but he is now back in [place], unsure of onset. He is not aware of afib/flutter.   Today, he denies symptoms of palpitations, chest pain, shortness of breath, orthopnea, PND, lower extremity edema, dizziness, presyncope, syncope, or neurologic sequela. The patient is tolerating medications without difficulties and is otherwise without complaint today.   Past Medical History:  Diagnosis Date  . Anxiety attack   . Elevated transaminase level    suspect combo of hyperlip/fatty liver + alcohol.  Viral Hep panel neg 2016.  Marland Kitchen Gout    multiple episodes (L MTP jt)  . History of gluten intolerance   . Hyperlipemia    Intolerant of statins: tolerates welchol  . Hypertension   . Impaired fasting glucose 03/2014  . 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 Metabolic syndrome    IFG, low HDL, elev trigs: nutritionist referral 04/2014 but patient did not show for appointment.  Marland Kitchen Perennial allergic rhinitis   . Persistent atrial fibrillation 2013; 2019   Dr. Percival Spanish.  Spont conv in ED.  Outpt eval showed nl ECHO--ASA qd and BB/CCB started.  Recurrence (asympt) 2019--needs cardioversion per cards, started on xarelto, echo fine, DC cardioversion not successful 06/11/17-- Needs anxiety med,  stop self-med with ETOH--cards d/c'd anticoag until stops ETOH. He stopped ETOH, back on xarelto and flecain 11/2018->plan for CV again.   Past Surgical History:  Procedure Laterality Date  . CARDIOVERSION N/A 06/11/2017   Procedure: CARDIOVERSION;  Surgeon: Skeet Latch, MD;  Location: [place];  Service: Cardiovascular;  Laterality: N/A;  . CARDIOVERSION N/A 01/23/2019   Procedure: CARDIOVERSION;  Surgeon: Buford Dresser, MD;  Location: [facility] ENDOSCOPY;  Service: Cardiovascular;  Laterality: N/A;  . DC cardioversion  06/11/2017   was back in A-fib within 5 min of waking up.  . TRANSTHORACIC ECHOCARDIOGRAM  04/2012    NORMAL (ED 55-65%)    Current Outpatient Medications  Medication Sig Dispense Refill  . Ascorbic Acid (VITAMIN C) 1000 MG tablet Take 1,000 mg by mouth 2 (two) times daily.    . cetirizine (ZYRTEC) 10 MG tablet Take 10 mg by mouth at bedtime.     . clotrimazole-betamethasone (LOTRISONE) cream Apply 1 application topically 2 (two) times daily. (Patient taking differently: Apply 1 application topically 2 (two) times daily as needed (rash). ) 45 g 2  . diltiazem (CARDIZEM CD) 180 MG 24 hr capsule TAKE 1 CAPSULE BY MOUTH EVERY DAY (Patient taking differently: Take 180 mg by mouth daily. ) 90 capsule 0  . escitalopram (LEXAPRO) 10 MG tablet Take 1 tablet (10 mg total) by mouth daily. (Patient taking differently: Take 10 mg by mouth at bedtime. ) 90 tablet 0  . flecainide (TAMBOCOR) 50 MG tablet Take 2 tablets (100 mg total) by mouth 2 (two) times daily. Start on August 31 60 tablet 11  . lisinopril (ZESTRIL) 40 MG tablet Take 1 tablet (40 mg total) by mouth daily. (Patient taking differently: Take 40 mg by mouth at bedtime. ) 90 tablet 1  . LORazepam (ATIVAN) 0.5 MG tablet 1-2 tabs po qd prn severe anxiety (Patient taking differently: Take 0.5 mg by mouth daily as needed for anxiety. ) 30 tablet 0  . Omega-3 Fatty Acids (FISH OIL) 1200 MG CAPS Take 1,200 mg by mouth 2 (two) times daily.    . rivaroxaban (XARELTO) 20 MG TABS tablet Take 1 tablet (20 mg total) by mouth daily with supper. 30 tablet 11   No current facility-administered medications for this encounter.     No Known Allergies  Social History   Socioeconomic History  . Marital status: Married    Spouse name: Not on file  . Number of children: 2  . Years of education: Not on file  . Highest education level: Not on file  Occupational History  . Occupation: Chief Financial Officer  Social Needs  . Financial resource strain: Not on file  . Food insecurity    Worry: Not on file    Inability: Not on file  . Transportation needs     Medical: Not on file    Non-medical: Not on file  Tobacco Use  . Smoking status: Never Smoker  . Smokeless tobacco: Never Used  Substance and Sexual Activity  . Alcohol use: Yes    Alcohol/week: 4.0 - 5.0 standard drinks    Types: 4 - 5 Cans of beer per week    Comment: daily  . Drug use: No  . Sexual activity: Not on file  Lifestyle  . Physical activity    Days per week: Not on file    Minutes per session: Not on file  . Stress: Not on file  Relationships  . Social connections    Talks on phone: Not on file  Gets together: Not on file    Attends religious service: Not on file    Active member of club or organization: Not on file    Attends meetings of clubs or organizations: Not on file    Relationship status: Not on file  . Intimate partner violence    Fear of current or ex partner: Not on file    Emotionally abused: Not on file    Physically abused: Not on file    Forced sexual activity: Not on file  Other Topics Concern  . Not on file  Social History Narrative   Lives at home with wife and two children.     Occupation: Chief Financial Officer.   No tob or drugs.  Alc: 4 beers per day.  Denies hx of alc problems/abuse.  However, he says he tried quitting in the past and after a few days he started to have "panic attacks".    Family History  Problem Relation Age of Onset  . Heart disease Father        pacemaker, atrial fibrillation  . Hypertension Father     ROS- All systems are reviewed and negative except as per the HPI above  Physical Exam: Vitals:   02/01/19 1548  BP: 118/88  Pulse: 97  Weight: 101 kg  Height: 6\' 2"  (1.88 m)   Wt Readings from Last 3 Encounters:  02/01/19 101 kg  01/23/19 96.2 kg  01/05/19 100.6 kg    Labs: Lab Results  Component Value Date   NA 137 01/10/2019   K 4.3 01/10/2019   CL 104 01/10/2019   CO2 24 01/10/2019   GLUCOSE 109 (H) 01/10/2019   BUN 20 01/10/2019   CREATININE 1.12 01/10/2019   CALCIUM 8.8 (L) 01/10/2019    No results found for: INR Lab Results  Component Value Date   CHOL 197 06/10/2018   HDL 31.90 (L) 06/10/2018   LDLCALC 133 (H) 04/26/2017   TRIG 237.0 (H) 06/10/2018     GEN- The patient is well appearing, alert and oriented x 3 today.   Head- normocephalic, atraumatic Eyes-  Sclera clear, conjunctiva pink Ears- hearing intact Oropharynx- clear Neck- supple, no JVP Lymph- no cervical lymphadenopathy Lungs- Clear to ausculation bilaterally, normal work of breathing Heart- irregular rate and rhythm, no murmurs, rubs or gallops, PMI not laterally displaced GI- soft, NT, ND, + BS Extremities- no clubbing, cyanosis, or edema MS- no significant deformity or atrophy Skin- no rash or lesion Psych- euthymic mood, full affect Neuro- strength and sensation are intact  EKG- a flutter at 97  bpm,  qrs int 86 ms, qtc 401 ms Echo- to be updated  EKG after cardioversion- Sinus brady at 47 bpm with first degree AV block, PT was advised to stop BB    Assessment and Plan: 1. Persistent  asymptomatic afib/flutter  ? Duration  Successful cardioversion but ERAF Discussed with pt updating echo and referring to EP for possible ablation  In the interim, continue  flecainide 100 mg bid   Chadsvasc score of 1 continue on xarelto 20 mg daily Continue diltiazem 180 mg daily   2. HTN  Stable  After BB was stopped due to bradycardia following DCCV, pt noted BP went up He went back on metoprolol tartrate  25 mg daily with good control of BP  After echo reviewed will  likely refer to Dr. Rayann Heman for possible ablation   Butch Penny C. [facility] [address] [place], [place] [address]  [phone]  

## 2019-02-09 ENCOUNTER — Ambulatory Visit (HOSPITAL_COMMUNITY)
Admission: RE | Admit: 2019-02-09 | Discharge: 2019-02-09 | Disposition: A | Discharge status: Home / Self Care | Payer: BC Managed Care – PPO | Source: Ambulatory Visit | Attending: Nurse Practitioner | Admitting: Nurse Practitioner

## 2019-02-09 ENCOUNTER — Other Ambulatory Visit: Payer: Self-pay

## 2019-02-09 DIAGNOSIS — I4819 Other persistent atrial fibrillation: Secondary | ICD-10-CM

## 2019-02-09 NOTE — Progress Notes (Signed)
  Echocardiogram 2D Echocardiogram has been performed.  Riley Moore G [name] 02/09/2019, 3:55 PM

## 2019-02-20 ENCOUNTER — Encounter (HOSPITAL_COMMUNITY): Payer: Self-pay | Admitting: *Deleted

## 2019-02-23 ENCOUNTER — Telehealth: Payer: Self-pay

## 2019-02-27 ENCOUNTER — Telehealth: Payer: Self-pay

## 2019-02-27 ENCOUNTER — Telehealth (INDEPENDENT_AMBULATORY_CARE_PROVIDER_SITE_OTHER): Payer: BC Managed Care – PPO | Admitting: Internal Medicine

## 2019-02-27 ENCOUNTER — Encounter: Payer: Self-pay | Admitting: Internal Medicine

## 2019-02-27 VITALS — Ht 74.0 in | Wt 220.0 lb

## 2019-02-27 DIAGNOSIS — I4819 Other persistent atrial fibrillation: Secondary | ICD-10-CM

## 2019-02-27 DIAGNOSIS — I10 Essential (primary) hypertension: Secondary | ICD-10-CM | POA: Diagnosis not present

## 2019-02-27 NOTE — Progress Notes (Signed)
Electrophysiology TeleHealth Note   Due to national recommendations of social distancing due to [place] 19, Audio/video telehealth visit is felt to be most appropriate for this patient at this time.  See MyChart message from today for patient consent regarding telehealth for [facility].   Date:  02/27/2019   ID:  Riley Moore, DOB 1965/08/11, MRN [id number]  Location: home  Provider location: [address], [place] Evaluation Performed: New patient consult  PCP:  Tammi Sou, MD  Cardiologist:  Minus Breeding, MD  Electrophysiologist:  None   Chief Complaint:  afib  History of Present Illness:    Riley Moore is a 53 y.o. male who presents via audio/video conferencing for a telehealth visit today.   The patient is referred for new consultation regarding afib by Dr Percival Spanish and AF clinic.  The patient reports initially being diagnosed with afib in 2013.  He did well for years but returned to afib 04/2017.   He also has a h/o heavy ETOH.  Initial cardioversion was not success.  He has tried flecainide without resolution of atrial arrhythmias.  He did organize into typical atrial flutter on flecainide.  Most recently, he underwent cardioversion 01/23/2019.  Unfortunately, he has already returned to afib.   He reports feeling well.  Denies symptoms fatigue, SOB.  Denies DOE. Today, he denies symptoms of palpitations, chest pain, shortness of breath, orthopnea, PND, lower extremity edema, claudication, dizziness, presyncope, syncope, bleeding, or neurologic sequela. The patient is tolerating medications without difficulties and is otherwise without complaint today.    Past Medical History:  Diagnosis Date  . Anxiety attack   . Elevated transaminase level    suspect combo of hyperlip/fatty liver + alcohol.  Viral Hep panel neg 2016.  Marland Kitchen Gout    multiple episodes (L MTP jt)  . History of gluten intolerance   . Hyperlipemia    Intolerant of statins: tolerates welchol   . Hypertension   . Impaired fasting glucose 03/2014  . 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 Metabolic syndrome    IFG, low HDL, elev trigs: nutritionist referral 04/2014 but patient did not show for appointment.  Marland Kitchen Perennial allergic rhinitis   . Persistent atrial fibrillation ([place]) 2013; 2019   Dr. Percival Spanish.  Spont conv in ED.  Outpt eval showed nl ECHO--ASA qd and BB/CCB started.  Recurrence (asympt) 2019--needs cardioversion per cards, started on xarelto, echo fine, DC cardioversion not successful 06/11/17-- Needs anxiety med, stop self-med with ETOH--cards d/c'd anticoag until stops ETOH. He stopped ETOH, back on xarelto and flecain 11/2018->plan for CV again.    Past Surgical History:  Procedure Laterality Date  . CARDIOVERSION N/A 06/11/2017   Procedure: CARDIOVERSION;  Surgeon: Skeet Latch, MD;  Location: [place];  Service: Cardiovascular;  Laterality: N/A;  . CARDIOVERSION N/A 01/23/2019   Procedure: CARDIOVERSION;  Surgeon: Buford Dresser, MD;  Location: [facility] ENDOSCOPY;  Service: Cardiovascular;  Laterality: N/A;  . DC cardioversion  06/11/2017   was back in A-fib within 5 min of waking up.  . TRANSTHORACIC ECHOCARDIOGRAM  04/2012   NORMAL (ED 55-65%)    Current Outpatient Medications  Medication Sig Dispense Refill  . Ascorbic Acid (VITAMIN C) 1000 MG tablet Take 1,000 mg by mouth 2 (two) times daily.    . cetirizine (ZYRTEC) 10 MG tablet Take 10 mg by mouth at bedtime.     . clotrimazole-betamethasone (  LOTRISONE) cream Apply 1 application topically 2 (two) times daily. (Patient taking differently: Apply 1 application topically 2 (two) times daily as needed (rash). ) 45 g 2  . diltiazem (CARDIZEM CD) 180 MG 24 hr capsule TAKE 1 CAPSULE BY MOUTH EVERY DAY (Patient taking differently: Take 180 mg by mouth daily. ) 90 capsule 0  . escitalopram (LEXAPRO) 10 MG tablet Take 1 tablet (10 mg  total) by mouth daily. (Patient taking differently: Take 10 mg by mouth at bedtime. ) 90 tablet 0  . flecainide (TAMBOCOR) 50 MG tablet Take 2 tablets (100 mg total) by mouth 2 (two) times daily. Start on August 31 60 tablet 11  . lisinopril (ZESTRIL) 40 MG tablet Take 1 tablet (40 mg total) by mouth daily. (Patient taking differently: Take 40 mg by mouth at bedtime. ) 90 tablet 1  . LORazepam (ATIVAN) 0.5 MG tablet 1-2 tabs po qd prn severe anxiety (Patient taking differently: Take 0.5 mg by mouth daily as needed for anxiety. ) 30 tablet 0  . Omega-3 Fatty Acids (FISH OIL) 1200 MG CAPS Take 1,200 mg by mouth 2 (two) times daily.    . rivaroxaban (XARELTO) 20 MG TABS tablet Take 1 tablet (20 mg total) by mouth daily with supper. 30 tablet 11   No current facility-administered medications for this visit.     Allergies:   Patient has no known allergies.   Social History:  The patient  reports that he has never smoked. He has never used smokeless tobacco. He reports current alcohol use of about 4.0 - 5.0 standard drinks of alcohol per week. He reports that he does not use drugs.   Family History:  The patient's family history includes Heart disease in his father; Hypertension in his father.    ROS:  Please see the history of present illness.   All other systems are personally reviewed and negative.    Exam:    Vital Signs:  Ht 6\' 2"  (1.88 m)   Wt 220 lb (99.8 kg)   BMI 28.25 kg/m    Well sounding, alert and conversant    Labs/Other Tests and Data Reviewed:    Recent Labs: 06/10/2018: ALT 56; TSH 2.02 01/10/2019: BUN 20; Creatinine, Ser 1.12; Hemoglobin 15.4; Platelets 169; Potassium 4.3; Sodium 137   Wt Readings from Last 3 Encounters:  02/27/19 220 lb (99.8 kg)  02/01/19 222 lb 9.6 oz (101 kg)  01/23/19 212 lb (96.2 kg)     Other studies personally reviewed: Additional studies/ records that were reviewed today include: AF clinic notes  Review of the above records today  demonstrates: as above  Echo- reviewed  ASSESSMENT & PLAN:    1.  Persistent atrial fibrillation/ atrial flutter The patient has symptomatic, recurrent persistent atrial fibrillation/ atrial flutter. he has failed medical therapy with flecainide Chads2vasc score is 1.  he is anticoagulated with xarelto. Therapeutic strategies for afib including medicine and ablation were discussed in detail with the patient today. Risk, benefits, and alternatives to EP study and radiofrequency ablation for afib were also discussed in detail today. These risks include but are not limited to stroke, bleeding, vascular damage, tamponade, perforation, damage to the esophagus, lungs, and other structures, pulmonary vein stenosis, worsening renal function, and death. The patient understands these risk and wishes to proceed.  We will therefore proceed with catheter ablation at the next available time.  Carto, ICE, anesthesia are requested for the procedure.  Will also obtain cardiac CT prior to the procedure to  exclude LAA thrombus and further evaluate atrial anatomy.  2. HTN Stable No change required today   Current medicines are reviewed at length with the patient today.   The patient does not have concerns regarding his medicines.  The following changes were made today:  none  Labs/ tests ordered today include:  No orders of the defined types were placed in this encounter.   Patient Risk:  after full review of this patients clinical status, I feel that they are at high risk at this time.   Today, I have spent 20 minutes with the patient with telehealth technology discussing afib .    Signed, Thompson Grayer MD, [facility] 02/27/2019 3:40 PM   [place] [address] [phone] (office) [phone] (fax)

## 2019-02-27 NOTE — Telephone Encounter (Signed)
-----   Message from Thompson Grayer, MD sent at 02/27/2019  3:54 PM EDT ----- Afib ablation C/I/A  Cardiac CT

## 2019-03-08 MED ORDER — METOPROLOL TARTRATE 100 MG PO TABS: ORAL_TABLET | ORAL | 0 refills | Status: DC

## 2019-03-08 NOTE — Telephone Encounter (Signed)
Work up complete. 

## 2019-03-22 ENCOUNTER — Other Ambulatory Visit (HOSPITAL_COMMUNITY)
Admission: RE | Admit: 2019-03-22 | Discharge: 2019-03-22 | Disposition: A | Discharge status: Home / Self Care | Payer: BC Managed Care – PPO | Source: Ambulatory Visit | Attending: Internal Medicine | Admitting: Internal Medicine

## 2019-03-22 DIAGNOSIS — Z20828 Contact with and (suspected) exposure to other viral communicable diseases: Secondary | ICD-10-CM | POA: Diagnosis not present

## 2019-03-22 DIAGNOSIS — Z01812 Encounter for preprocedural laboratory examination: Secondary | ICD-10-CM | POA: Diagnosis not present

## 2019-03-22 LAB — SARS CORONAVIRUS 2 (TAT 6-24 HRS): SARS Coronavirus 2: NEGATIVE

## 2019-03-23 ENCOUNTER — Telehealth (HOSPITAL_COMMUNITY): Payer: Self-pay | Admitting: Emergency Medicine

## 2019-03-23 NOTE — Telephone Encounter (Signed)
Reaching out to patient to offer assistance regarding upcoming cardiac imaging study; pt verbalizes understanding of appt date/time, parking situation and where to check in, pre-test NPO status and medications ordered, and verified current allergies; name and call back number provided for further questions should they arise Marchia Bond RN Navigator Cardiac Imaging [place] [facility] [phone] office [phone] cell  Sending mychart message as reference for patient to follow up with if needed.

## 2019-03-24 ENCOUNTER — Ambulatory Visit (HOSPITAL_COMMUNITY)
Admission: RE | Admit: 2019-03-24 | Discharge: 2019-03-24 | Disposition: A | Discharge status: Home / Self Care | Payer: BC Managed Care – PPO | Source: Ambulatory Visit | Attending: Internal Medicine | Admitting: Internal Medicine

## 2019-03-24 ENCOUNTER — Other Ambulatory Visit: Payer: Self-pay

## 2019-03-24 DIAGNOSIS — I4819 Other persistent atrial fibrillation: Secondary | ICD-10-CM | POA: Insufficient documentation

## 2019-03-24 MED ORDER — IOHEXOL 350 MG/ML SOLN
80.0000 mL | Freq: Once | INTRAVENOUS | Status: AC | PRN
  Administered 2019-03-24: 80 mL via INTRAVENOUS

## 2019-03-28 ENCOUNTER — Telehealth: Payer: Self-pay | Admitting: Family Medicine

## 2019-03-28 ENCOUNTER — Other Ambulatory Visit: Payer: Self-pay

## 2019-03-28 ENCOUNTER — Other Ambulatory Visit: Payer: BC Managed Care – PPO

## 2019-03-28 DIAGNOSIS — I4819 Other persistent atrial fibrillation: Secondary | ICD-10-CM

## 2019-03-28 NOTE — Telephone Encounter (Signed)
Pt is scheduled for atrial fibrillation ablation on 03/31/19. Last refill was 12/16/18 (90,0).  Please advise, thanks.

## 2019-03-28 NOTE — Telephone Encounter (Signed)
OK to fill 30d supply, no RF, and we'll see if he has to remain on this med after his ablation.

## 2019-03-28 NOTE — Telephone Encounter (Signed)
Patient refill request  diltiazem (CARDIZEM CD) 180 MG 24 hr capsule  [facility] - [address]

## 2019-03-29 LAB — CBC WITH DIFFERENTIAL/PLATELET
Basophils Absolute: 0.1 10*3/uL (ref 0.0–0.2)
Basos: 1 %
EOS (ABSOLUTE): 0.4 10*3/uL (ref 0.0–0.4)
Eos: 4 %
Hematocrit: 44 % (ref 37.5–51.0)
Hemoglobin: 15.5 g/dL (ref 13.0–17.7)
Immature Grans (Abs): 0.1 10*3/uL (ref 0.0–0.1)
Immature Granulocytes: 1 %
Lymphocytes Absolute: 2.8 10*3/uL (ref 0.7–3.1)
Lymphs: 27 %
MCH: 31.6 pg (ref 26.6–33.0)
MCHC: 35.2 g/dL (ref 31.5–35.7)
MCV: 90 fL (ref 79–97)
Monocytes Absolute: 1 10*3/uL — ABNORMAL HIGH (ref 0.1–0.9)
Monocytes: 10 %
Neutrophils Absolute: 6 10*3/uL (ref 1.4–7.0)
Neutrophils: 57 %
Platelets: 186 10*3/uL (ref 150–450)
RBC: 4.9 x10E6/uL (ref 4.14–5.80)
RDW: 12.6 % (ref 11.6–15.4)
WBC: 10.3 10*3/uL (ref 3.4–10.8)

## 2019-03-29 LAB — BASIC METABOLIC PANEL WITH GFR
BUN/Creatinine Ratio: 21 — ABNORMAL HIGH (ref 9–20)
BUN: 19 mg/dL (ref 6–24)
CO2: 24 mmol/L (ref 20–29)
Calcium: 8.9 mg/dL (ref 8.7–10.2)
Chloride: 101 mmol/L (ref 96–106)
Creatinine, Ser: 0.9 mg/dL (ref 0.76–1.27)
GFR calc Af Amer: 112 mL/min/{1.73_m2}
GFR calc non Af Amer: 96 mL/min/{1.73_m2}
Glucose: 115 mg/dL — ABNORMAL HIGH (ref 65–99)
Potassium: 4.6 mmol/L (ref 3.5–5.2)
Sodium: 139 mmol/L (ref 134–144)

## 2019-03-29 MED ORDER — DILTIAZEM HCL ER COATED BEADS 180 MG PO CP24: 180.0000 mg | ORAL_CAPSULE | Freq: Every day | ORAL | 0 refills | Status: DC

## 2019-03-29 NOTE — Telephone Encounter (Signed)
MyChart message read.

## 2019-03-30 NOTE — Anesthesia Preprocedure Evaluation (Addendum)
Anesthesia Evaluation  Patient identified by MRN, date of birth, ID band Patient awake    Reviewed: Allergy & Precautions, NPO status , Patient's Chart, lab work & pertinent test results  Airway Mallampati: I  TM Distance: >3 FB Neck ROM: Full    Dental no notable dental hx. (+) Teeth Intact   Pulmonary neg pulmonary ROS,    Pulmonary exam normal breath sounds clear to auscultation       Cardiovascular hypertension, Pt. on medications and Pt. on home beta blockers Normal cardiovascular exam+ dysrhythmias Atrial Fibrillation  Rhythm:Regular Rate:Normal  02/09/2019 Echo   1. Left ventricular ejection fraction, by visual estimation, is 60 to 65%. The left ventricle has normal function. Normal left ventricular size. There is mildly increased left ventricular hypertrophy.    Neuro/Psych negative neurological ROS  negative psych ROS   GI/Hepatic negative GI ROS, Neg liver ROS,   Endo/Other  negative endocrine ROS  Renal/GU K+ 4.6 Cr 0.9     Musculoskeletal Gout   Abdominal   Peds  Hematology Hgb 15.5 Plt 186   Anesthesia Other Findings   Reproductive/Obstetrics                            Anesthesia Physical Anesthesia Plan  ASA: II  Anesthesia Plan: General   Post-op Pain Management:    Induction: Intravenous  PONV Risk Score and Plan: 3 and Treatment may vary due to age or medical condition, Midazolam and Ondansetron  Airway Management Planned: Oral ETT  Additional Equipment: None  Intra-op Plan:   Post-operative Plan: Extubation in OR  Informed Consent: I have reviewed the patients History and Physical, chart, labs and discussed the procedure including the risks, benefits and alternatives for the proposed anesthesia with the patient or authorized representative who has indicated his/her understanding and acceptance.     Dental advisory given  Plan Discussed with:  CRNA  Anesthesia Plan Comments: (GA for ablation)       Anesthesia Quick Evaluation

## 2019-03-31 ENCOUNTER — Ambulatory Visit (HOSPITAL_COMMUNITY): Payer: BC Managed Care – PPO | Admitting: Anesthesiology

## 2019-03-31 ENCOUNTER — Ambulatory Visit (HOSPITAL_COMMUNITY)
Admission: RE | Admit: 2019-03-31 | Discharge: 2019-03-31 | Disposition: A | Discharge status: Home / Self Care | Payer: BC Managed Care – PPO | Attending: Internal Medicine | Admitting: Internal Medicine

## 2019-03-31 ENCOUNTER — Other Ambulatory Visit: Payer: Self-pay

## 2019-03-31 ENCOUNTER — Ambulatory Visit (HOSPITAL_COMMUNITY)
Admission: RE | Disposition: A | Discharge status: Home / Self Care | Payer: Self-pay | Source: Home / Self Care | Attending: Internal Medicine

## 2019-03-31 DIAGNOSIS — I4891 Unspecified atrial fibrillation: Secondary | ICD-10-CM | POA: Diagnosis not present

## 2019-03-31 DIAGNOSIS — Z7901 Long term (current) use of anticoagulants: Secondary | ICD-10-CM | POA: Diagnosis not present

## 2019-03-31 DIAGNOSIS — I10 Essential (primary) hypertension: Secondary | ICD-10-CM | POA: Insufficient documentation

## 2019-03-31 DIAGNOSIS — I483 Typical atrial flutter: Secondary | ICD-10-CM | POA: Diagnosis not present

## 2019-03-31 DIAGNOSIS — Z8249 Family history of ischemic heart disease and other diseases of the circulatory system: Secondary | ICD-10-CM | POA: Diagnosis not present

## 2019-03-31 DIAGNOSIS — E785 Hyperlipidemia, unspecified: Secondary | ICD-10-CM | POA: Diagnosis not present

## 2019-03-31 DIAGNOSIS — Z79899 Other long term (current) drug therapy: Secondary | ICD-10-CM | POA: Diagnosis not present

## 2019-03-31 DIAGNOSIS — I4819 Other persistent atrial fibrillation: Secondary | ICD-10-CM | POA: Insufficient documentation

## 2019-03-31 DIAGNOSIS — I4892 Unspecified atrial flutter: Secondary | ICD-10-CM | POA: Diagnosis not present

## 2019-03-31 HISTORY — PX: ATRIAL FIBRILLATION ABLATION: EP1191

## 2019-03-31 LAB — POCT ACTIVATED CLOTTING TIME
Activated Clotting Time: 318 seconds
Activated Clotting Time: 296 seconds

## 2019-03-31 SURGERY — ATRIAL FIBRILLATION ABLATION
Anesthesia: General

## 2019-03-31 MED ORDER — MIDAZOLAM HCL 5 MG/5ML IJ SOLN
INTRAMUSCULAR | Status: DC | PRN
  Administered 2019-03-31: 2 mg via INTRAVENOUS

## 2019-03-31 MED ORDER — SODIUM CHLORIDE 0.9 % IV SOLN: 250.0000 mL | INTRAVENOUS | Status: DC | PRN

## 2019-03-31 MED ORDER — BUPIVACAINE HCL (PF) 0.25 % IJ SOLN
INTRAMUSCULAR | Status: DC | PRN
  Administered 2019-03-31: 20 mL

## 2019-03-31 MED ORDER — ONDANSETRON HCL 4 MG/2ML IJ SOLN
INTRAMUSCULAR | Status: DC | PRN
  Administered 2019-03-31: 4 mg via INTRAVENOUS

## 2019-03-31 MED ORDER — BUPIVACAINE HCL (PF) 0.25 % IJ SOLN
INTRAMUSCULAR | Status: AC
  Filled 2019-03-31: qty 30

## 2019-03-31 MED ORDER — ACETAMINOPHEN 325 MG PO TABS
650.0000 mg | ORAL_TABLET | ORAL | Status: DC | PRN
  Filled 2019-03-31: qty 2

## 2019-03-31 MED ORDER — HYDROCODONE-ACETAMINOPHEN 5-325 MG PO TABS: 1.0000 | ORAL_TABLET | ORAL | Status: DC | PRN

## 2019-03-31 MED ORDER — ROCURONIUM BROMIDE 10 MG/ML (PF) SYRINGE
PREFILLED_SYRINGE | INTRAVENOUS | Status: DC | PRN
  Administered 2019-03-31: 50 mg via INTRAVENOUS
  Administered 2019-03-31: 20 mg via INTRAVENOUS

## 2019-03-31 MED ORDER — HEPARIN (PORCINE) IN NACL 1000-0.9 UT/500ML-% IV SOLN
INTRAVENOUS | Status: DC | PRN
  Administered 2019-03-31: 500 mL

## 2019-03-31 MED ORDER — HEPARIN SODIUM (PORCINE) 1000 UNIT/ML IJ SOLN
INTRAMUSCULAR | Status: DC | PRN
  Administered 2019-03-31: 1000 [IU] via INTRAVENOUS

## 2019-03-31 MED ORDER — FENTANYL CITRATE (PF) 250 MCG/5ML IJ SOLN
INTRAMUSCULAR | Status: DC | PRN
  Administered 2019-03-31: 100 ug via INTRAVENOUS
  Administered 2019-03-31 (×4): 50 ug via INTRAVENOUS

## 2019-03-31 MED ORDER — HEPARIN (PORCINE) IN NACL 1000-0.9 UT/500ML-% IV SOLN
INTRAVENOUS | Status: AC
  Filled 2019-03-31: qty 500

## 2019-03-31 MED ORDER — PHENYLEPHRINE 40 MCG/ML (10ML) SYRINGE FOR IV PUSH (FOR BLOOD PRESSURE SUPPORT)
PREFILLED_SYRINGE | INTRAVENOUS | Status: DC | PRN
  Administered 2019-03-31 (×4): 80 ug via INTRAVENOUS

## 2019-03-31 MED ORDER — ISOPROTERENOL HCL 0.2 MG/ML IJ SOLN
INTRAMUSCULAR | Status: AC
  Filled 2019-03-31: qty 5

## 2019-03-31 MED ORDER — HEPARIN SODIUM (PORCINE) 1000 UNIT/ML IJ SOLN
INTRAMUSCULAR | Status: DC | PRN
  Administered 2019-03-31: 14000 [IU]
  Administered 2019-03-31: 1000 [IU] via INTRAVENOUS

## 2019-03-31 MED ORDER — PROPOFOL 10 MG/ML IV BOLUS
INTRAVENOUS | Status: DC | PRN
  Administered 2019-03-31: 150 mg via INTRAVENOUS

## 2019-03-31 MED ORDER — HEPARIN SODIUM (PORCINE) 1000 UNIT/ML IJ SOLN
INTRAMUSCULAR | Status: AC
  Filled 2019-03-31: qty 2

## 2019-03-31 MED ORDER — PROTAMINE SULFATE 10 MG/ML IV SOLN
INTRAVENOUS | Status: DC | PRN
  Administered 2019-03-31: 40 mg via INTRAVENOUS

## 2019-03-31 MED ORDER — ONDANSETRON HCL 4 MG/2ML IJ SOLN: 4.0000 mg | Freq: Four times a day (QID) | INTRAMUSCULAR | Status: DC | PRN

## 2019-03-31 MED ORDER — SODIUM CHLORIDE 0.9% FLUSH: 3.0000 mL | INTRAVENOUS | Status: DC | PRN

## 2019-03-31 MED ORDER — ISOPROTERENOL HCL 0.2 MG/ML IJ SOLN
INTRAVENOUS | Status: DC | PRN
  Administered 2019-03-31: 10:00:00 20 ug/min via INTRAVENOUS

## 2019-03-31 MED ORDER — SODIUM CHLORIDE 0.9% FLUSH: 3.0000 mL | Freq: Two times a day (BID) | INTRAVENOUS | Status: DC

## 2019-03-31 MED ORDER — DEXAMETHASONE SODIUM PHOSPHATE 10 MG/ML IJ SOLN
INTRAMUSCULAR | Status: DC | PRN
  Administered 2019-03-31: 4 mg via INTRAVENOUS

## 2019-03-31 MED ORDER — PANTOPRAZOLE SODIUM 40 MG PO TBEC: 40.0000 mg | DELAYED_RELEASE_TABLET | Freq: Every day | ORAL | 0 refills | Status: DC

## 2019-03-31 MED ORDER — SODIUM CHLORIDE 0.9 % IV SOLN
INTRAVENOUS | Status: DC
  Administered 2019-03-31 (×2): via INTRAVENOUS

## 2019-03-31 MED ORDER — LIDOCAINE 2% (20 MG/ML) 5 ML SYRINGE
INTRAMUSCULAR | Status: DC | PRN
  Administered 2019-03-31: 100 mg via INTRAVENOUS

## 2019-03-31 MED ORDER — PHENYLEPHRINE HCL-NACL 10-0.9 MG/250ML-% IV SOLN
INTRAVENOUS | Status: DC | PRN
  Administered 2019-03-31: 25 ug/min via INTRAVENOUS

## 2019-03-31 SURGICAL SUPPLY — 19 items
BLANKET WARM UNDERBOD FULL ACC (MISCELLANEOUS) ×2 IMPLANT
CATH MAPPNG PENTARAY F 2-6-2MM (CATHETERS) ×1 IMPLANT
CATH SMTCH THERMOCOOL SF DF (CATHETERS) ×2 IMPLANT
CATH SOUNDSTAR ECO 8FR (CATHETERS) ×2 IMPLANT
CATH WEBSTER BI DIR CS D-F CRV (CATHETERS) ×2 IMPLANT
COVER SWIFTLINK CONNECTOR (BAG) ×2 IMPLANT
DEVICE CLOSURE PERCLS PRGLD 6F (VASCULAR PRODUCTS) ×3 IMPLANT
NEEDLE BAYLIS TRANSSEPTAL 71CM (NEEDLE) ×2 IMPLANT
PACK EP LATEX FREE (CUSTOM PROCEDURE TRAY) ×1
PACK EP LF (CUSTOM PROCEDURE TRAY) ×1 IMPLANT
PAD PRO RADIOLUCENT 2001M-C (PAD) ×2 IMPLANT
PATCH CARTO3 (PAD) ×2 IMPLANT
PENTARAY F 2-6-2MM (CATHETERS) ×2
PERCLOSE PROGLIDE 6F (VASCULAR PRODUCTS) ×6
SHEATH PINNACLE 7F 10CM (SHEATH) ×4 IMPLANT
SHEATH PINNACLE 9F 10CM (SHEATH) ×2 IMPLANT
SHEATH PROBE COVER 6X72 (BAG) ×2 IMPLANT
SHEATH SWARTZ TS SL2 63CM 8.5F (SHEATH) ×2 IMPLANT
TUBING SMART ABLATE COOLFLOW (TUBING) ×4 IMPLANT

## 2019-03-31 NOTE — Discharge Instructions (Signed)
Cardiac Ablation, Care After This sheet gives you information about how to care for yourself after your procedure. Your health care provider may also give you more specific instructions. If you have problems or questions, contact your health care provider. What can I expect after the procedure? After the procedure, it is common to have:  Bruising around your puncture site.  Tenderness around your puncture site.  Skipped heartbeats.  Tiredness (fatigue).  Follow these instructions at home: Puncture site care   Check your puncture site every day for signs of infection. Check for: ? Redness, swelling, or pain. ? Fluid or blood. If your puncture site starts to bleed, lie down on your back, apply firm pressure to the area, and contact your health care provider. ? Warmth. ? Pus or a bad smell. Driving  Do not drive for at least 4 days after your procedure or however long your health care provider recommends.  Do not drive or use heavy machinery while taking prescription pain medicine.  Do not drive for 24 hours if you were given a medicine to help you relax (sedative) during your procedure. Activity  Avoid activities that take a lot of effort for at least 7 days after your procedure.  Do not lift anything that is heavier than 5 lb (4.5 kg) for one week.   No sexual activity for 1 week.   Return to your normal activities as told by your health care provider. Ask your health care provider what activities are safe for you. General instructions  Take over-the-counter and prescription medicines only as told by your health care provider.  Do not use any products that contain nicotine or tobacco, such as cigarettes and e-cigarettes. If you need help quitting, ask your health care provider.  You may shower after 24 hours, but Do not take baths, swim, or use a hot tub for 1 week.   Do not drink alcohol for 24 hours after your procedure.  Keep all follow-up visits as told by your  health care provider. This is important. Contact a health care provider if:  You have redness, mild swelling, or pain around your puncture site.  You have fluid or blood coming from your puncture site that stops after applying firm pressure to the area.  Your puncture site feels warm to the touch.  You have pus or a bad smell coming from your puncture site.  You have a fever.  You have chest pain or discomfort that spreads to your neck, jaw, or arm.  You are sweating a lot.  You feel nauseous.  You have a fast or irregular heartbeat.  You have shortness of breath.  You are dizzy or light-headed and feel the need to lie down.  You have pain or numbness in the arm or leg closest to your puncture site. Get help right away if:  Your puncture site suddenly swells.  Your puncture site is bleeding and the bleeding does not stop after applying firm pressure to the area. These symptoms may represent a serious problem that is an emergency. Do not wait to see if the symptoms will go away. Get medical help right away. Call your local emergency services (911 in the [place] Do not drive yourself to the hospital. Summary  After the procedure, it is normal to have bruising and tenderness at the puncture site in your groin, neck, or forearm.  Check your puncture site every day for signs of infection.  Get help right away if your puncture site is  bleeding and the bleeding does not stop after applying firm pressure to the area. This is a medical emergency. This information is not intended to replace advice given to you by your health care provider. Make sure you discuss any questions you have with your health care provider.

## 2019-03-31 NOTE — Transfer of Care (Signed)
Immediate Anesthesia Transfer of Care Note  Patient: CLEOTIS KEPFORD  Procedure(s) Performed: ATRIAL FIBRILLATION ABLATION (N/A )  Patient Location: Cath Lab  Anesthesia Type:General  Level of Consciousness: awake, alert  and oriented  Airway & Oxygen Therapy: Patient Spontanous Breathing and Patient connected to nasal cannula oxygen  Post-op Assessment: Report given to RN, Post -op Vital signs reviewed and stable and Patient moving all extremities  Post vital signs: Reviewed and stable  Last Vitals:  Vitals Value Taken Time  BP    Temp    Pulse    Resp    SpO2      Last Pain:  Vitals:   03/31/19 1108  TempSrc: Temporal  PainSc: 0-No pain      Patients Stated Pain Goal: 3 ([id number])  Complications: No apparent anesthesia complications

## 2019-03-31 NOTE — H&P (Signed)
Chief Complaint:  afib  History of Present Illness:    Riley Moore is a 53 y.o. male who presents for afib ablation. The patient reports initially being diagnosed with afib in 2013.  He did well for years but returned to afib 04/2017.   He also has a h/o heavy ETOH.  Initial cardioversion was not success.  He has tried flecainide without resolution of atrial arrhythmias.  He did organize into typical atrial flutter on flecainide.  Most recently, he underwent cardioversion 01/23/2019.  Unfortunately, he has already returned to afib.   He reports feeling well.  Denies symptoms fatigue, SOB.  Denies DOE. Today, he denies symptoms of palpitations, chest pain, shortness of breath, orthopnea, PND, lower extremity edema, claudication, dizziness, presyncope, syncope, bleeding, or neurologic sequela. The patient is tolerating medications without difficulties and is otherwise without complaint today.        Past Medical History:  Diagnosis Date  . Anxiety attack   . Elevated transaminase level    suspect combo of hyperlip/fatty liver + alcohol.  Viral Hep panel neg 2016.  Marland Kitchen Gout    multiple episodes (L MTP jt)  . History of gluten intolerance   . Hyperlipemia    Intolerant of statins: tolerates welchol  . Hypertension   . Impaired fasting glucose 03/2014  . 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 Metabolic syndrome    IFG, low HDL, elev trigs: nutritionist referral 04/2014 but patient did not show for appointment.  Marland Kitchen Perennial allergic rhinitis   . Persistent atrial fibrillation ([place]) 2013; 2019   Dr. Percival Spanish.  Spont conv in ED.  Outpt eval showed nl ECHO--ASA qd and BB/CCB started.  Recurrence (asympt) 2019--needs cardioversion per cards, started on xarelto, echo fine, DC cardioversion not successful 06/11/17-- Needs anxiety med, stop self-med with ETOH--cards d/c'd anticoag until stops ETOH. He stopped  ETOH, back on xarelto and flecain 11/2018->plan for CV again.         Past Surgical History:  Procedure Laterality Date  . CARDIOVERSION N/A 06/11/2017   Procedure: CARDIOVERSION;  Surgeon: Skeet Latch, MD;  Location: [facility];  Service: Cardiovascular;  Laterality: N/A;  . CARDIOVERSION N/A 01/23/2019   Procedure: CARDIOVERSION;  Surgeon: Buford Dresser, MD;  Location: [facility] ENDOSCOPY;  Service: Cardiovascular;  Laterality: N/A;  . DC cardioversion  06/11/2017   was back in A-fib within 5 min of waking up.  . TRANSTHORACIC ECHOCARDIOGRAM  04/2012   NORMAL (ED 55-65%)          Current Outpatient Medications  Medication Sig Dispense Refill  . Ascorbic Acid (VITAMIN C) 1000 MG tablet Take 1,000 mg by mouth 2 (two) times daily.    . cetirizine (ZYRTEC) 10 MG tablet Take 10 mg by mouth at bedtime.     . clotrimazole-betamethasone (LOTRISONE) cream Apply 1 application topically 2 (two) times daily. (Patient taking differently: Apply 1 application topically 2 (two) times daily as needed (rash). ) 45 g 2  . diltiazem (CARDIZEM CD) 180 MG 24 hr capsule TAKE 1 CAPSULE BY MOUTH EVERY DAY (Patient taking differently: Take 180 mg by mouth daily. ) 90 capsule 0  . escitalopram (LEXAPRO) 10 MG tablet Take 1 tablet (10 mg total) by mouth daily. (Patient taking differently: Take 10 mg by mouth at bedtime. ) 90 tablet 0  . flecainide (TAMBOCOR) 50 MG tablet Take 2 tablets (100 mg total) by mouth 2 (two) times daily. Start  on August 31 60 tablet 11  . lisinopril (ZESTRIL) 40 MG tablet Take 1 tablet (40 mg total) by mouth daily. (Patient taking differently: Take 40 mg by mouth at bedtime. ) 90 tablet 1  . LORazepam (ATIVAN) 0.5 MG tablet 1-2 tabs po qd prn severe anxiety (Patient taking differently: Take 0.5 mg by mouth daily as needed for anxiety. ) 30 tablet 0  . Omega-3 Fatty Acids (FISH OIL) 1200 MG CAPS Take 1,200 mg by mouth 2 (two) times daily.    . rivaroxaban (XARELTO) 20  MG TABS tablet Take 1 tablet (20 mg total) by mouth daily with supper. 30 tablet 11   No current facility-administered medications for this visit.     Allergies:   Patient has no known allergies.   Social History:  The patient  reports that he has never smoked. He has never used smokeless tobacco. He reports current alcohol use of about 4.0 - 5.0 standard drinks of alcohol per week. He reports that he does not use drugs.   Family History:  The patient's family history includes Heart disease in his father; Hypertension in his father.    ROS:  Please see the history of present illness.   All other systems are personally reviewed and negative.    Physical Exam: Vitals:   03/31/19 0542  BP: 122/83  Pulse: 65  Resp: 18  Temp: 98.4 F (36.9 C)  TempSrc: Oral  SpO2: 99%  Weight: 98.9 kg  Height: 6\' 2"  (1.88 m)    GEN- The patient is well appearing, alert and oriented x 3 today.   Head- normocephalic, atraumatic Eyes-  Sclera clear, conjunctiva pink Ears- hearing intact Oropharynx- clear Neck- supple, Lungs-  normal work of breathing Heart- irregular rate and rhythm  GI- soft  Extremities- no clubbing, cyanosis, or edema,  MS- no significant deformity or atrophy Skin- no rash or lesion Psych- euthymic mood, full affect Neuro- strength and sensation are intact   Labs/Other Tests and Data Reviewed:    Recent Labs: 06/10/2018: ALT 56; TSH 2.02 01/10/2019: BUN 20; Creatinine, Ser 1.12; Hemoglobin 15.4; Platelets 169; Potassium 4.3; Sodium 137      Wt Readings from Last 3 Encounters:  02/27/19 220 lb (99.8 kg)  02/01/19 222 lb 9.6 oz (101 kg)  01/23/19 212 lb (96.2 kg)     Other studies personally reviewed: Additional studies/ records that were reviewed today include: AF clinic notes  Review of the above records today demonstrates: as above  Echo- reviewed  ASSESSMENT & PLAN:    1.  Persistent atrial fibrillation/ atrial flutter The patient has  symptomatic, recurrent persistent atrial fibrillation/ atrial flutter. he has failed medical therapy with flecainide Chads2vasc score is 1.  he is anticoagulated with xarelto.  Risk, benefits, and alternatives to EP study and radiofrequency ablation for afib and atrial flutter were discussed again in detail today. These risks include but are not limited to stroke, bleeding, vascular damage, tamponade, perforation, damage to the esophagus, lungs, and other structures, pulmonary vein stenosis, worsening renal function, and death. The patient understands these risk and wishes to proceed.  Cardiac CT reviewed with patient.  He reports compliance with xarelto without interruption  Thompson Grayer MD, [facility] 03/31/2019 7:25 AM

## 2019-03-31 NOTE — Anesthesia Procedure Notes (Signed)
Procedure Name: Intubation Date/Time: 03/31/2019 7:47 AM Performed by: Amadeo Garnet, CRNA Pre-anesthesia Checklist: Patient identified, Emergency Drugs available, Suction available and Patient being monitored Patient Re-evaluated:Patient Re-evaluated prior to induction Oxygen Delivery Method: Circle system utilized Preoxygenation: Pre-oxygenation with 100% oxygen Induction Type: IV induction Ventilation: Mask ventilation without difficulty Laryngoscope Size: Mac and 3 Grade View: Grade I Tube type: Oral Tube size: 7.5 mm Number of attempts: 1 Airway Equipment and Method: Stylet Placement Confirmation: ETT inserted through vocal cords under direct vision,  positive ETCO2 and breath sounds checked- equal and bilateral Secured at: 22 cm Tube secured with: Tape Dental Injury: Teeth and Oropharynx as per pre-operative assessment

## 2019-03-31 NOTE — Anesthesia Postprocedure Evaluation (Signed)
Anesthesia Post Note  Patient: Riley Moore  Procedure(s) Performed: ATRIAL FIBRILLATION ABLATION (N/A )     Patient location during evaluation: PACU Anesthesia Type: General Level of consciousness: awake and alert Pain management: pain level controlled Vital Signs Assessment: post-procedure vital signs reviewed and stable Respiratory status: spontaneous breathing, nonlabored ventilation, respiratory function stable and patient connected to nasal cannula oxygen Cardiovascular status: blood pressure returned to baseline and stable Postop Assessment: no apparent nausea or vomiting Anesthetic complications: no    Last Vitals:  Vitals:   03/31/19 1125 03/31/19 1135  BP: 102/61 (!) 102/58  Pulse: (!) 57 (!) 55  Resp: (!) 9 (!) 8  Temp:  (!) 36.4 C  SpO2: 99% 100%    Last Pain:  Vitals:   03/31/19 1125  TempSrc:   PainSc: 0-No pain                 [name]

## 2019-03-31 NOTE — Progress Notes (Signed)
Neo decreased to 10 mcg/hr

## 2019-03-31 NOTE — Progress Notes (Signed)
Neo turned off. Patient awake and alert. Sitting up drinking.  Right Groin CDI

## 2019-03-31 NOTE — Progress Notes (Signed)
Patient arrived to unit with low b/p and HR.  Diaphoretic but responds to voice. I placed in trendelenburg while CRNA left for medications.   CRNA started on neo gtt at 20 mcg/hr to assist with low bp.   R groin CDI.

## 2019-04-03 ENCOUNTER — Encounter (HOSPITAL_COMMUNITY): Payer: Self-pay | Admitting: Internal Medicine

## 2019-04-03 MED FILL — Heparin Sod (Porcine)-NaCl IV Soln 1000 Unit/500ML-0.9%: INTRAVENOUS | Qty: 500 | Status: AC

## 2019-04-06 ENCOUNTER — Other Ambulatory Visit: Payer: Self-pay

## 2019-04-06 DIAGNOSIS — Z20822 Contact with and (suspected) exposure to covid-19: Secondary | ICD-10-CM

## 2019-04-08 LAB — NOVEL CORONAVIRUS, NAA: SARS-CoV-2, NAA: DETECTED — AB

## 2019-04-11 ENCOUNTER — Telehealth: Payer: Self-pay | Admitting: Family Medicine

## 2019-04-11 NOTE — Telephone Encounter (Signed)
Please advise, thanks.

## 2019-04-11 NOTE — Telephone Encounter (Signed)
Patient tested COVID + Dec 5th. 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

## 2019-04-12 NOTE — Telephone Encounter (Signed)
Latest CDC guidelines are to self quarantine for 10 days after positive covid test AND must be feeling improved and have no fever for 3 consecutive days (while not on anti-fever meds).-thx

## 2019-04-12 NOTE — Telephone Encounter (Signed)
Patient sent the following MyChart message: "My concern is with the other sick family members in my house. They got the virus from me after several days. Can I spread the virus from other sick family members in our house even though I have fully recovered and should be able to return to work? We still have an active Covid sickness in our home."  Please advise, thanks.

## 2019-04-12 NOTE — Telephone Encounter (Signed)
I'm sorry but I don't think anyone knows the answer to this question. I think that if you go to work and wear a mask, wash hands frequently, and practice social distancing then the risk is minimal.  That's the best I can answer you. --PM

## 2019-04-13 NOTE — Telephone Encounter (Signed)
MyChart message read.

## 2019-04-20 ENCOUNTER — Other Ambulatory Visit: Payer: Self-pay | Admitting: Family Medicine

## 2019-04-20 NOTE — Telephone Encounter (Signed)
Medication pending. Last refill given by PCP for 30 day supply until surgical procedure on 11/27. Please advise, thanks.

## 2019-04-20 NOTE — Telephone Encounter (Signed)
I'm declining this RF. Since he got an ablation done he needs to request this RF through his cardiologist.-thx

## 2019-04-20 NOTE — Telephone Encounter (Signed)
RF request for Diltiazem LOV:09/02/18 Next ov: advised to f/u 4 mo. CPE Last written:  03/29/19 (30,0)   Pt had ablation on 03/31/19 and has a f/u appt on 12/28 w/ cardiology. Please advise, thanks.

## 2019-05-01 ENCOUNTER — Ambulatory Visit (HOSPITAL_COMMUNITY)
Admission: RE | Admit: 2019-05-01 | Discharge: 2019-05-01 | Disposition: A | Discharge status: Home / Self Care | Payer: BC Managed Care – PPO | Source: Ambulatory Visit | Attending: Physician Assistant | Admitting: Physician Assistant

## 2019-05-01 ENCOUNTER — Encounter (HOSPITAL_COMMUNITY): Payer: Self-pay | Admitting: Physician Assistant

## 2019-05-01 ENCOUNTER — Other Ambulatory Visit: Payer: Self-pay

## 2019-05-01 VITALS — BP 132/80 | HR 54 | Ht 74.0 in | Wt 221.6 lb

## 2019-05-01 DIAGNOSIS — Z7901 Long term (current) use of anticoagulants: Secondary | ICD-10-CM | POA: Diagnosis not present

## 2019-05-01 DIAGNOSIS — Z79899 Other long term (current) drug therapy: Secondary | ICD-10-CM | POA: Insufficient documentation

## 2019-05-01 DIAGNOSIS — I4819 Other persistent atrial fibrillation: Secondary | ICD-10-CM | POA: Diagnosis not present

## 2019-05-01 DIAGNOSIS — I10 Essential (primary) hypertension: Secondary | ICD-10-CM | POA: Insufficient documentation

## 2019-05-01 DIAGNOSIS — Z8249 Family history of ischemic heart disease and other diseases of the circulatory system: Secondary | ICD-10-CM | POA: Insufficient documentation

## 2019-05-01 DIAGNOSIS — E785 Hyperlipidemia, unspecified: Secondary | ICD-10-CM | POA: Diagnosis not present

## 2019-05-01 DIAGNOSIS — I4892 Unspecified atrial flutter: Secondary | ICD-10-CM | POA: Insufficient documentation

## 2019-05-01 NOTE — Progress Notes (Signed)
Primary Care Physician: Tammi Sou, MD Referring Physician: [facility] ER f/u Primary EP: Dr Zadie Cleverly Riley Moore is a 53 y.o. male with a h/o afib first onset in 2013. He did well for a number of years, staying in [place], but recently found to be in [place] in April 29, 2017, when he went to the ER for chest pain. His chest pain resolved but he continued to be in afib. It was rate controlled. He was unaware. He did not drink large amounts of caffeine but stated that he drank  a six pack a night and may increase to 8 on the weekends for anxiety. He reported  that he would have panic attacks if he cut  down on his beer intake. He has been on ASA daily, diltiazem and metoprolol for years. Chadsvasc score is 1 for HTN.  When I initially saw him, I was concerned to start anticoagulation with his alcoholism and wanted him to see his PCP for means of treating his anxiety, other than with alcohol. He was pending a f/u with Dr. Percival Spanish within the month.   He was started on anticoagulation and cardioverted, however he had ERAF before leaving the hospital. He was told to come back for an antiarrythmic when he was able to stop alcohol. He just did f/u with Dr. Percival Spanish again 11/24/18. He has stopped alcohol a while back and wanted to try to get back in rhythm. The plan was for pt to start anticoagulation x 3 weeks and then start flecainide 50 mg bid. I was to see pt a few days after starting flecainide. the pt has not started xarelto yet but does plan to start 8/10. Hence,  I am seeing pt prematurely in the midst of this plan.  He states that he is still  asymptomatic with afib and is rate controlled. If he fails AAD/DCCV, he wants to pursue ablation.  F/u in afib clinic, 9/3. He is now on flecainide 50 mg bid. He remains in atrial flutter at 83 bpm. He is tolerating flecainide, feels no different on drug. No missed doses of xarelto since starting.   He was increased to flecainide 100 mg bid and cardioverted,  9/21.. This was successful but he is now back in [place], unsure of onset. He is not aware of afib/flutter.   Follow up AF clinic 05/01/19. Patient is s/p afib and flutter ablation with Dr Rayann Heman on 03/31/19. He reports that he has done well since his ablation from a cardiac standpoint. He did have COVID-19 and is recovering from that. He denies any heart racing or palpitations. He denies CP, swallowing, or groin issues.  Today, he denies symptoms of palpitations, chest pain, shortness of breath, orthopnea, PND, lower extremity edema, dizziness, presyncope, syncope, or neurologic sequela. The patient is tolerating medications without difficulties and is otherwise without complaint today.   Past Medical History:  Diagnosis Date  . Anxiety attack   . Elevated transaminase level    suspect combo of hyperlip/fatty liver + alcohol.  Viral Hep panel neg 2016.  Marland Kitchen Gout    multiple episodes (L MTP jt)  . History of gluten intolerance   . Hyperlipemia    Intolerant of statins: tolerates welchol  . Hypertension   . Impaired fasting glucose 03/2014  . 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 Metabolic syndrome    IFG, low HDL, elev trigs: nutritionist referral 04/2014  but patient did not show for appointment.  Marland Kitchen Perennial allergic rhinitis   . Persistent atrial fibrillation ([place]) 2013; 2019   Dr. Percival Spanish.  Spont conv in ED.  Outpt eval showed nl ECHO--ASA qd and BB/CCB started.  Recurrence (asympt) 2019--needs cardioversion per cards, started on xarelto, echo fine, DC cardioversion not successful 06/11/17-- Needs anxiety med, stop self-med with ETOH--cards d/c'd anticoag until stops ETOH. He stopped ETOH, back on xarelto and flecain 11/2018->plan for CV again.   Past Surgical History:  Procedure Laterality Date  . ATRIAL FIBRILLATION ABLATION N/A 03/31/2019   Procedure: ATRIAL FIBRILLATION ABLATION;  Surgeon: Thompson Grayer, MD;   Location: [place] [facility];  Service: Cardiovascular;  Laterality: N/A;  . CARDIOVERSION N/A 06/11/2017   Procedure: CARDIOVERSION;  Surgeon: Skeet Latch, MD;  Location: [facility] ENDOSCOPY;  Service: Cardiovascular;  Laterality: N/A;  . CARDIOVERSION N/A 01/23/2019   Procedure: CARDIOVERSION;  Surgeon: Buford Dresser, MD;  Location: [facility] ENDOSCOPY;  Service: Cardiovascular;  Laterality: N/A;  . DC cardioversion  06/11/2017   was back in A-fib within 5 min of waking up.  . TRANSTHORACIC ECHOCARDIOGRAM  04/2012   NORMAL (ED 55-65%)    Current Outpatient Medications  Medication Sig Dispense Refill  . Ascorbic Acid (VITAMIN C) 1000 MG tablet Take 1,000 mg by mouth 2 (two) times daily.    . cetirizine (ZYRTEC) 10 MG tablet Take 10 mg by mouth at bedtime.     . cholecalciferol (VITAMIN D) 25 MCG (1000 UT) tablet Take 1,000 Units by mouth 2 (two) times daily.    . clotrimazole-betamethasone (LOTRISONE) cream Apply 1 application topically 2 (two) times daily. (Patient taking differently: Apply 1 application topically 2 (two) times daily as needed (rash). ) 45 g 2  . diltiazem (CARDIZEM CD) 180 MG 24 hr capsule TAKE 1 CAPSULE BY MOUTH EVERY DAY 30 capsule 0  . escitalopram (LEXAPRO) 10 MG tablet Take 1 tablet (10 mg total) by mouth daily. (Patient taking differently: Take 10 mg by mouth at bedtime. ) 90 tablet 0  . flecainide (TAMBOCOR) 50 MG tablet Take 2 tablets (100 mg total) by mouth 2 (two) times daily. Start on August 31 60 tablet 11  . lisinopril (ZESTRIL) 40 MG tablet Take 1 tablet (40 mg total) by mouth daily. (Patient taking differently: Take 40 mg by mouth at bedtime. ) 90 tablet 1  . LORazepam (ATIVAN) 0.5 MG tablet 1-2 tabs po qd prn severe anxiety (Patient not taking: Reported on 03/23/2019) 30 tablet 0  . Omega-3 Fatty Acids (FISH OIL) 1200 MG CAPS Take 1,200 mg by mouth 2 (two) times daily.    . pantoprazole (PROTONIX) 40 MG tablet Take 1 tablet (40 mg total) by mouth daily. 45  tablet 0  . Probiotic Product (PROBIOTIC DAILY PO) Take 1 tablet by mouth 2 (two) times daily.    . rivaroxaban (XARELTO) 20 MG TABS tablet Take 1 tablet (20 mg total) by mouth daily with supper. (Patient taking differently: Take 20 mg by mouth at bedtime. ) 30 tablet 11   No current facility-administered medications for this visit.    No Known Allergies  Social History   Socioeconomic History  . Marital status: Married    Spouse name: Not on file  . Number of children: 2  . Years of education: Not on file  . Highest education level: Not on file  Occupational History  . Occupation: Chief Financial Officer  Tobacco Use  . Smoking status: Never Smoker  . Smokeless tobacco: Never Used  Substance and Sexual Activity  . Alcohol use: Yes    Alcohol/week: 4.0 - 5.0 standard drinks    Types: 4 - 5 Cans of beer per week    Comment: daily  . Drug use: No  . Sexual activity: Not on file  Other Topics Concern  . Not on file  Social History Narrative   Lives at home with wife and two children.     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  Alc: 4 beers per day.  Denies hx of alc problems/abuse.  However, he says he tried quitting in the past and after a few days he started to have "panic attacks".         Social Determinants of Health   Financial Resource Strain:   . Difficulty of Paying Living Expenses: Not on file  Food Insecurity:   . Worried About Charity fundraiser in the Last Year: Not on file  . Ran Out of Food in the Last Year: Not on file  Transportation Needs:   . Lack of Transportation (Medical): Not on file  . Lack of Transportation (Non-Medical): Not on file  Physical Activity:   . Days of Exercise per Week: Not on file  . Minutes of Exercise per Session: Not on file  Stress:   . Feeling of Stress : Not on file  Social Connections:   . Frequency of Communication with Friends and Family: Not on file  . Frequency of  Social Gatherings with Friends and Family: Not on file  . Attends Religious Services: Not on file  . Active Member of Clubs or Organizations: Not on file  . Attends Archivist Meetings: Not on file  . Marital Status: Not on file  Intimate Partner Violence:   . Fear of Current or Ex-Partner: Not on file  . Emotionally Abused: Not on file  . Physically Abused: Not on file  . Sexually Abused: Not on file    Family History  Problem Relation Age of Onset  . Heart disease Father        pacemaker, atrial fibrillation  . Hypertension Father     ROS- All systems are reviewed and negative except as per the HPI above  Physical Exam: There were no vitals filed for this visit. Wt Readings from Last 3 Encounters:  03/31/19 218 lb (98.9 kg)  02/27/19 220 lb (99.8 kg)  02/01/19 222 lb 9.6 oz (101 kg)    Labs: Lab Results  Component Value Date   NA 139 03/28/2019   K 4.6 03/28/2019   CL 101 03/28/2019   CO2 24 03/28/2019   GLUCOSE 115 (H) 03/28/2019   BUN 19 03/28/2019   CREATININE 0.90 03/28/2019   CALCIUM 8.9 03/28/2019   No results found for: INR Lab Results  Component Value Date   CHOL 197 06/10/2018   HDL 31.90 (L) 06/10/2018   LDLCALC 133 (H) 04/26/2017   TRIG 237.0 (H) 06/10/2018    GEN- The patient is well appearing, alert and oriented x 3 today.   HEENT-head normocephalic, atraumatic, sclera clear, conjunctiva pink, hearing intact, trachea midline. Lungs- Clear to ausculation bilaterally, normal work of breathing Heart- Regular rate and rhythm, no murmurs, rubs or gallops  GI- soft, NT, ND, + BS Extremities- no clubbing, cyanosis, or edema MS- no significant deformity or atrophy Skin- no rash or lesion Psych- euthymic mood, full affect Neuro- strength and sensation are intact   EKG- SB HR 54,  1st degree AV block, PR 210, QRS 80, QTc 392  Echo- 02/09/19  1. Left ventricular ejection fraction, by visual estimation, is 60 to 65%. The left ventricle has  normal function. Normal left ventricular size. There is mildly increased left ventricular hypertrophy.  2. Left ventricular diastolic Doppler parameters are indeterminate pattern of LV diastolic filling.  3. Global right ventricle has normal systolic function.The right ventricular size is normal. No increase in right ventricular wall thickness.  4. Left atrial size was moderately dilated.  5. Right atrial size was moderately dilated.  6. The mitral valve is normal in structure. Mild mitral valve regurgitation. No evidence of mitral stenosis.  7. The tricuspid valve is normal in structure. Tricuspid valve regurgitation was not visualized by color flow Doppler.  8. The aortic valve is normal in structure. Aortic valve regurgitation was not visualized by color flow Doppler. Structurally normal aortic valve, with no evidence of sclerosis or stenosis.  9. The pulmonic valve was normal in structure. Pulmonic valve regurgitation is not visualized by color flow Doppler. 10. The inferior vena cava is normal in size with greater than 50% respiratory variability, suggesting right atrial pressure of 3 mmHg.   Assessment and Plan: 1. Persistent  asymptomatic afib/flutter  S/p afib and flutter ablation 03/31/19. Patient appears to be maintaining SR. Continue flecainide 100 mg BID Continue on Xarelto 20 mg daily for at least 3 months post ablation with no missed doses. Continue diltiazem 180 mg daily  This patients CHA2DS2-VASc Score and unadjusted Ischemic Stroke Rate (% per year) is equal to 0.6 % stroke rate/year from a score of 1  Above score calculated as 1 point each if present [CHF, HTN, DM, Vascular=MI/PAD/Aortic Plaque, Age if 65-74, or Male] Above score calculated as 2 points each if present [Age > 75, or Stroke/TIA/TE]   2. HTN  Stable, no changes today.   Follow up with Dr Rayann Heman as scheduled.    [facility] [address] [place],  [place] [address] [phone]

## 2019-05-07 ENCOUNTER — Other Ambulatory Visit: Payer: Self-pay | Admitting: Internal Medicine

## 2019-05-07 ENCOUNTER — Other Ambulatory Visit: Payer: Self-pay | Admitting: Family Medicine

## 2019-05-10 ENCOUNTER — Encounter: Payer: Self-pay | Admitting: Family Medicine

## 2019-06-06 ENCOUNTER — Other Ambulatory Visit: Payer: Self-pay | Admitting: Family Medicine

## 2019-06-09 ENCOUNTER — Other Ambulatory Visit: Payer: Self-pay | Admitting: Family Medicine

## 2019-06-09 ENCOUNTER — Other Ambulatory Visit: Payer: Self-pay | Admitting: Cardiology

## 2019-06-12 ENCOUNTER — Other Ambulatory Visit: Payer: Self-pay

## 2019-06-12 NOTE — Telephone Encounter (Signed)
RF request for Diltiazem Last written:04/22/19 (30,0)

## 2019-06-13 MED ORDER — DILTIAZEM HCL ER COATED BEADS 180 MG PO CP24: ORAL_CAPSULE | ORAL | 3 refills | Status: DC

## 2019-07-03 ENCOUNTER — Other Ambulatory Visit: Payer: Self-pay | Admitting: Family Medicine

## 2019-07-03 DIAGNOSIS — E119 Type 2 diabetes mellitus without complications: Secondary | ICD-10-CM

## 2019-07-03 HISTORY — DX: Type 2 diabetes mellitus without complications: E11.9

## 2019-07-10 ENCOUNTER — Telehealth (INDEPENDENT_AMBULATORY_CARE_PROVIDER_SITE_OTHER): Payer: BC Managed Care – PPO | Admitting: Internal Medicine

## 2019-07-10 ENCOUNTER — Encounter: Payer: Self-pay | Admitting: Internal Medicine

## 2019-07-10 ENCOUNTER — Telehealth: Payer: Self-pay

## 2019-07-10 VITALS — Ht 74.0 in | Wt 215.0 lb

## 2019-07-10 DIAGNOSIS — I4819 Other persistent atrial fibrillation: Secondary | ICD-10-CM

## 2019-07-10 DIAGNOSIS — I10 Essential (primary) hypertension: Secondary | ICD-10-CM | POA: Diagnosis not present

## 2019-07-10 NOTE — Progress Notes (Signed)
Electrophysiology TeleHealth Note   Due to national recommendations of social distancing due to COVID 19, an audio/video telehealth visit is felt to be most appropriate for this patient at this time.  See MyChart message from today for the patient's consent to telehealth for [facility].  Date:  07/10/2019   ID:  Riley Moore, DOB 1966-03-20, MRN [id number]  Location: patient's home  Provider location:  [facility]  Evaluation Performed: Follow-up visit  PCP:  Tammi Sou, MD   Electrophysiologist:  Dr Rayann Heman  Chief Complaint:  palpitations  History of Present Illness:    Riley Moore is a 54 y.o. male who presents via telehealth conferencing today.  Since his ablation, the patient reports doing very well.  He had COVID 3 days after his ablation and had slow recovery from that.  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  He has completely recovered. Denies procedure related complications.  No symptoms of afib.  Today, he denies symptoms of palpitations, chest pain, shortness of breath,  lower extremity edema, dizziness, presyncope, or syncope.  The patient is otherwise without complaint today   Past Medical History:  Diagnosis Date  . Anxiety attack   . Elevated transaminase level    suspect combo of hyperlip/fatty liver + alcohol.  Viral Hep panel neg 2016.  Marland Kitchen Gout    multiple episodes (L MTP jt)  . History of gluten intolerance   . Hyperlipemia    Intolerant of statins: tolerates welchol  . Hypertension   . Impaired fasting glucose 03/2014  . 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 Metabolic syndrome    IFG, low HDL, elev trigs: nutritionist referral 04/2014 but patient did not show for appointment.  Marland Kitchen Perennial allergic rhinitis   . Persistent atrial fibrillation ([place]) 2013; 2019   Dr. Percival Spanish.  Spont conv in ED.  Outpt eval showed  nl ECHO--ASA qd and BB/CCB started.  Recurrence (asympt) 2019--needs cardioversion per cards, started on xarelto, echo fine, DC cardioversion not successful 06/11/17-- Needs anxiety med, stop self-med with ETOH--cards d/c'd anticoag until stops ETOH. He stopped ETOH, back on xarelto and flecain 11/2018->plan for CV again.    Past Surgical History:  Procedure Laterality Date  . ATRIAL FIBRILLATION ABLATION N/A 03/31/2019   Procedure: ATRIAL FIBRILLATION ABLATION;  Surgeon: Thompson Grayer, MD;  Location: [place] CV LAB;  Service: Cardiovascular;  Laterality: N/A;  . CARDIOVERSION N/A 06/11/2017   Procedure: CARDIOVERSION;  Surgeon: Skeet Latch, MD;  Location: [facility] ENDOSCOPY;  Service: Cardiovascular;  Laterality: N/A;  . CARDIOVERSION N/A 01/23/2019   Procedure: CARDIOVERSION;  Surgeon: Buford Dresser, MD;  Location: [facility] ENDOSCOPY;  Service: Cardiovascular;  Laterality: N/A;  . DC cardioversion  06/11/2017   was back in A-fib within 5 min of waking up.  . TRANSTHORACIC ECHOCARDIOGRAM  04/2012   NORMAL (ED 55-65%)    Current Outpatient Medications  Medication Sig Dispense Refill  . Ascorbic Acid (VITAMIN C) 1000 MG tablet Take 1,000 mg by mouth 2 (two) times daily.    . cetirizine (ZYRTEC) 10 MG tablet Take 10 mg by mouth at bedtime.     . cholecalciferol (VITAMIN D) 25 MCG (1000 UT) tablet Take 1,000 Units by mouth 2 (two) times daily.    . clotrimazole-betamethasone (LOTRISONE) cream Apply 1 application topically 2 (two) times daily. 45 g 2  . diltiazem (CARDIZEM  CD) 180 MG 24 hr capsule TAKE 1 CAPSULE BY MOUTH EVERY DAY 90 capsule 3  . escitalopram (LEXAPRO) 10 MG tablet TAKE 1 TABLET (10 MG TOTAL) BY MOUTH DAILY. PATIENT NEEDS AN OFFICE VISIT FOR FURTHER REFILLS. 30 tablet 0  . flecainide (TAMBOCOR) 50 MG tablet Take 2 tablets (100 mg total) by mouth 2 (two) times daily. Start on August 31 60 tablet 11  . lisinopril (ZESTRIL) 40 MG tablet Take 1 tablet (40 mg total) by mouth at  bedtime. 90 tablet 1  . LORazepam (ATIVAN) 0.5 MG tablet 1-2 tabs po qd prn severe anxiety 30 tablet 0  . Omega-3 Fatty Acids (FISH OIL) 1200 MG CAPS Take 1,200 mg by mouth 2 (two) times daily.    . Probiotic Product (PROBIOTIC DAILY PO) Take 1 tablet by mouth 2 (two) times daily.    . rivaroxaban (XARELTO) 20 MG TABS tablet Take 1 tablet (20 mg total) by mouth daily with supper. 30 tablet 11   No current facility-administered medications for this visit.    Allergies:   Patient has no known allergies.   Social History:  The patient  reports that he has never smoked. He has never used smokeless tobacco. He reports previous alcohol use of about 4.0 - 5.0 standard drinks of alcohol per week. He reports that he does not use drugs.   Family History:  The patient's family history includes Heart disease in his father; Hypertension in his father.   ROS:  Please see the history of present illness.   All other systems are personally reviewed and negative.    Exam:    Vital Signs:  Ht 6\' 2"  (1.88 m)   Wt 215 lb (97.5 kg)   BMI 27.60 kg/m   Well sounding and appearing, alert and conversant, regular work of breathing,  good skin color Eyes- anicteric, neuro- grossly intact, skin- no apparent rash or lesions or cyanosis, mouth- oral mucosa is pink  Labs/Other Tests and Data Reviewed:    Recent Labs: 03/28/2019: BUN 19; Creatinine, Ser 0.90; Hemoglobin 15.5; Platelets 186; Potassium 4.6; Sodium 139   Wt Readings from Last 3 Encounters:  07/10/19 215 lb (97.5 kg)  05/01/19 221 lb 9.6 oz (100.5 kg)  03/31/19 218 lb (98.9 kg)       ASSESSMENT & PLAN:    1.  Persistent atrial fibrillation/ atrial flutter Doing very well post ablation without recurrence Stop xarelto at this time as his chads2vasc score is 1. Stop flecainide We discussed Apple Watch and Kardia mobile as options for him to monitor his rhythm given paucity of symptoms with afib  2. HTN Stable No change required today   Follow-up:  3 months with me   Patient Risk:  after full review of this patients clinical status, I feel that they are at moderate risk at this time.  Today, I have spent 15 minutes with the patient with telehealth technology discussing arrhythmia management .    Army Fossa, MD  07/10/2019 9:02 AM     [facility] [address] [place] [address] [phone] (office) [phone] (fax)

## 2019-07-10 NOTE — Telephone Encounter (Signed)
-----   Message from Thompson Grayer, MD sent at 07/10/2019  9:15 AM EST ----- Stop xarelto and flecainide (he is aware)  Follow-up with me in 3 months

## 2019-07-17 ENCOUNTER — Ambulatory Visit: Payer: BC Managed Care – PPO | Admitting: Family Medicine

## 2019-07-17 ENCOUNTER — Encounter: Payer: Self-pay | Admitting: Family Medicine

## 2019-07-17 ENCOUNTER — Other Ambulatory Visit: Payer: Self-pay

## 2019-07-17 VITALS — BP 128/80 | HR 52 | Temp 97.9°F | Resp 16 | Ht 74.0 in | Wt 227.8 lb

## 2019-07-17 DIAGNOSIS — I4811 Longstanding persistent atrial fibrillation: Secondary | ICD-10-CM | POA: Diagnosis not present

## 2019-07-17 DIAGNOSIS — I10 Essential (primary) hypertension: Secondary | ICD-10-CM

## 2019-07-17 DIAGNOSIS — E8881 Metabolic syndrome: Secondary | ICD-10-CM | POA: Diagnosis not present

## 2019-07-17 DIAGNOSIS — L918 Other hypertrophic disorders of the skin: Secondary | ICD-10-CM

## 2019-07-17 DIAGNOSIS — Z9889 Other specified postprocedural states: Secondary | ICD-10-CM

## 2019-07-17 DIAGNOSIS — F411 Generalized anxiety disorder: Secondary | ICD-10-CM

## 2019-07-17 DIAGNOSIS — R7301 Impaired fasting glucose: Secondary | ICD-10-CM

## 2019-07-17 DIAGNOSIS — F1011 Alcohol abuse, in remission: Secondary | ICD-10-CM

## 2019-07-17 DIAGNOSIS — Z8679 Personal history of other diseases of the circulatory system: Secondary | ICD-10-CM

## 2019-07-17 LAB — LIPID PANEL
Cholesterol: 214 mg/dL — ABNORMAL HIGH (ref 0–200)
HDL: 33.7 mg/dL — ABNORMAL LOW (ref >39.00)
NonHDL: 180.79
Total CHOL/HDL Ratio: 6
Triglycerides: 257 mg/dL — ABNORMAL HIGH (ref 0.0–149.0)
VLDL: 51.4 mg/dL — ABNORMAL HIGH (ref 0.0–40.0)

## 2019-07-17 LAB — BASIC METABOLIC PANEL
BUN: 17 mg/dL (ref 6–23)
CO2: 27 mEq/L (ref 19–32)
Calcium: 9.1 mg/dL (ref 8.4–10.5)
Chloride: 100 mEq/L (ref 96–112)
Creatinine, Ser: 0.74 mg/dL (ref 0.40–1.50)
GFR: 109.86 mL/min (ref >60.00)
Glucose, Bld: 146 mg/dL — ABNORMAL HIGH (ref 70–99)
Potassium: 4.4 mEq/L (ref 3.5–5.1)
Sodium: 136 mEq/L (ref 135–145)

## 2019-07-17 LAB — TSH: TSH: 2.04 u[IU]/mL (ref 0.35–4.50)

## 2019-07-17 LAB — HEMOGLOBIN A1C: Hgb A1c MFr Bld: 7.1 % — ABNORMAL HIGH (ref 4.6–6.5)

## 2019-07-17 LAB — LDL CHOLESTEROL, DIRECT: Direct LDL: 135 mg/dL

## 2019-07-17 NOTE — Progress Notes (Signed)
OFFICE VISIT  07/17/2019   CC:  Chief Complaint  Patient presents with  . Follow-up    RCI, pt is fasting    HPI:    Patient is a 54 y.o. Caucasian male who presents for f/u HTN, a-fib, and metabolic syndrome. Hx of GAD and has hx of self medicating for this problem with alcohol. I last saw him 09/02/18 (virtual): A/P as of last visit: "1) GAD: improved 50% on lexapro 10 mg over the last 10 wks. Recommended increase to 20mg  qd dosing today but pt declined, wants to hold off for now and try to continue to cut back on ETOH.  He has NOT had to use lorazepam at all yet.  2) ETOH abuse: self medicating his anxiety.  This is slowly getting better the longer he is on lexapro. Encouraged pt to continue cutting back and would like to see him cut out alcohol altogether, but at least needs to get down to no more than 1-2 beers a couple days per week to be considered for anticoag and/or repeat DCCV for his a-fib in the future.  3) HTN: The current medical regimen is effective;  continue present plan and medications. Recheck CMET, A1c, FLP, and PSA at CPE in 4 mo."  Interim hx: Of note, he got a-fib ablation 03/31/19, did well, was taken off flecainide and anticoag and was doing well at cards f/u after procedure. He got covid illness after ablation, some mild SOB and lots of fatigue.  PMP AWARE reviewed today: most recent rx for lorazepam 0.5mg  was filled 12/16/18, # #0, rx by me. No red flags.  Has some around but not needing.  Home bp monitoring monthly is <130/80.  Taking lisin daily A-fib, doesn't feel palp's or heart racing.  Taking dilt and metop still for rate control.  Diet: eating healthier.   Lexapro: feeling good overall, no panic. Motivation maybe down some, otherwise no side effects. Exercise: walks 3 miles per day active pace most days of the week.  Left upper eyelid small skin tags, wants removed today.  ROS: no fevers, no CP, no SOB, no wheezing, no cough, no dizziness,  no HAs, no rashes, no melena/hematochezia.  No polyuria or polydipsia.  No myalgias or arthralgias.  No focal weakness, paresthesias, or tremors.  No acute vision or hearing abnormalities. No n/v/d or abd pain.  No palpitations.    Past Medical History:  Diagnosis Date  . Anxiety attack   . Elevated transaminase level    suspect combo of hyperlip/fatty liver + alcohol.  Viral Hep panel neg 2016.  Marland Kitchen Gout    multiple episodes (L MTP jt)  . History of gluten intolerance   . Hyperlipemia    Intolerant of statins: tolerates welchol  . Hypertension   . Impaired fasting glucose 03/2014  . 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 Metabolic syndrome    IFG, low HDL, elev trigs: nutritionist referral 04/2014 but patient did not show for appointment.  Marland Kitchen Perennial allergic rhinitis   . Persistent atrial fibrillation ([place]) 2013; 2019   Dr. Percival Spanish.  Spont conv in ED.  Outpt eval showed nl ECHO--ASA qd and BB/CCB started.  Recurrence (asympt) 2019--needs cardioversion per cards, started on xarelto, echo fine, DC cardioversion not successful 06/11/17-- Needs anxiety med, stop self-med with ETOH--cards d/c'd anticoag until stops ETOH. He stopped ETOH, back on xarelto and flecain 11/2018->plan for CV again.  Past Surgical History:  Procedure Laterality Date  . ATRIAL FIBRILLATION ABLATION N/A 03/31/2019   Procedure: ATRIAL FIBRILLATION ABLATION;  Surgeon: Thompson Grayer, MD;  Location: [facility];  Service: Cardiovascular;  Laterality: N/A;  . CARDIOVERSION N/A 06/11/2017   Procedure: CARDIOVERSION;  Surgeon: Skeet Latch, MD;  Location: [facility] ENDOSCOPY;  Service: Cardiovascular;  Laterality: N/A;  . CARDIOVERSION N/A 01/23/2019   Procedure: CARDIOVERSION;  Surgeon: Buford Dresser, MD;  Location: [facility] ENDOSCOPY;  Service: Cardiovascular;  Laterality: N/A;  . DC cardioversion  06/11/2017   was back in A-fib within 5  min of waking up.  . TRANSTHORACIC ECHOCARDIOGRAM  04/2012   NORMAL (ED 55-65%)   Social History   Socioeconomic History  . Marital status: Married    Spouse name: Not on file  . Number of children: 2  . Years of education: Not on file  . Highest education level: Not on file  Occupational History  . Occupation: Chief Financial Officer  Tobacco Use  . Smoking status: Never Smoker  . Smokeless tobacco: Never Used  Substance and Sexual Activity  . Alcohol use: Not Currently    Alcohol/week: 4.0 - 5.0 standard drinks    Types: 4 - 5 Cans of beer per week    Comment: daily  . Drug use: No  . Sexual activity: Not on file  Other Topics Concern  . Not on file  Social History Narrative   Lives at home with wife and two children.     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  Alc: 4 beers per day.  Denies hx of alc problems/abuse.  However, he says he tried quitting in the past and after a few days he started to have "panic attacks".         Social Determinants of Health   Financial Resource Strain:   . Difficulty of Paying Living Expenses:   Food Insecurity:   . Worried About Charity fundraiser in the Last Year:   . Arboriculturist in the Last Year:   Transportation Needs:   . Film/video editor (Medical):   Marland Kitchen Lack of Transportation (Non-Medical):   Physical Activity:   . Days of Exercise per Week:   . Minutes of Exercise per Session:   Stress:   . Feeling of Stress :   Social Connections:   . Frequency of Communication with Friends and Family:   . Frequency of Social Gatherings with Friends and Family:   . Attends Religious Services:   . Active Member of Clubs or Organizations:   . Attends Archivist Meetings:   Marland Kitchen Marital Status:     Outpatient Medications Prior to Visit  Medication Sig Dispense Refill  . Ascorbic Acid (VITAMIN C) 1000 MG tablet Take 1,000 mg by mouth 2 (two) times daily.    . cetirizine  (ZYRTEC) 10 MG tablet Take 10 mg by mouth at bedtime.     . cholecalciferol (VITAMIN D) 25 MCG (1000 UT) tablet Take 1,000 Units by mouth 2 (two) times daily.    . clotrimazole-betamethasone (LOTRISONE) cream Apply 1 application topically 2 (two) times daily. 45 g 2  . diltiazem (CARDIZEM CD) 180 MG 24 hr capsule TAKE 1 CAPSULE BY MOUTH EVERY DAY 90 capsule 3  . escitalopram (LEXAPRO) 10 MG tablet TAKE 1 TABLET (10 MG TOTAL) BY MOUTH DAILY. PATIENT NEEDS AN OFFICE VISIT FOR FURTHER REFILLS. 30 tablet 0  . lisinopril (ZESTRIL) 40 MG  tablet Take 1 tablet (40 mg total) by mouth at bedtime. 90 tablet 1  . Omega-3 Fatty Acids (FISH OIL) 1200 MG CAPS Take 1,200 mg by mouth 2 (two) times daily.    . Probiotic Product (PROBIOTIC DAILY PO) Take 1 tablet by mouth 2 (two) times daily.    Marland Kitchen LORazepam (ATIVAN) 0.5 MG tablet 1-2 tabs po qd prn severe anxiety (Patient not taking: Reported on 07/17/2019) 30 tablet 0   No facility-administered medications prior to visit.    No Known Allergies  ROS As per HPI  PE: Blood pressure 128/80, pulse (!) 52, temperature 97.9 F (36.6 C), temperature source Temporal, resp. rate 16, height 6\' 2"  (1.88 m), weight 227 lb 12.8 oz (103.3 kg), SpO2 99 %. Body mass index is 29.25 kg/m.  Gen: Alert, well appearing.  Patient is oriented to person, place, time, and situation. AFFECT: pleasant, lucid thought and speech. CV: RRR, no m/r/g.   LUNGS: CTA bilat, nonlabored resps, good aeration in all lung fields. EXT: no clubbing or cyanosis.  no edema.  Left upper eyelid with two very small pedunculated skin tags.   LABS:  Lab Results  Component Value Date   TSH 2.02 06/10/2018   Lab Results  Component Value Date   WBC 10.3 03/28/2019   HGB 15.5 03/28/2019   HCT 44.0 03/28/2019   MCV 90 03/28/2019   PLT 186 03/28/2019   Lab Results  Component Value Date   CREATININE 0.90 03/28/2019   BUN 19 03/28/2019   NA 139 03/28/2019   K 4.6 03/28/2019   CL 101  03/28/2019   CO2 24 03/28/2019   Lab Results  Component Value Date   ALT 56 (H) 06/10/2018   AST 31 06/10/2018   ALKPHOS 59 06/10/2018   BILITOT 0.7 06/10/2018   Lab Results  Component Value Date   CHOL 197 06/10/2018   Lab Results  Component Value Date   HDL 31.90 (L) 06/10/2018   Lab Results  Component Value Date   LDLCALC 133 (H) 04/26/2017   Lab Results  Component Value Date   TRIG 237.0 (H) 06/10/2018   Lab Results  Component Value Date   CHOLHDL 6 06/10/2018   Lab Results  Component Value Date   PSA 0.33 10/30/2014   Lab Results  Component Value Date   HGBA1C 5.7 06/10/2018    IMPRESSION AND PLAN:  1) HTN: Stable on lisin.  2) A-fib: maintaining Sinus, continuing low dose metop hs and dilt 180 cd qd for rate control. HR great today.   No anticoagulant or antiarrhythmic. TSH today.  3) IFG/metabolic syndrome: slowly working on improving diet/exercise. Fasting gluc, lytes, FLP, and HbA1c today  4) Skin tags: excised 2 on eyelid and 1 on center of anterior neck region today w/out any probs. Signs/symptoms to call or return for were reviewed and pt expressed understanding.  5) GAD: doing well on lexapro.  No med changes today.  An After Visit Summary was printed and given to the patient.  FOLLOW UP: No follow-ups on file.  Signed:  Crissie Sickles, MD           07/17/2019

## 2019-07-18 ENCOUNTER — Other Ambulatory Visit: Payer: Self-pay

## 2019-07-18 ENCOUNTER — Encounter: Payer: Self-pay | Admitting: Family Medicine

## 2019-07-18 DIAGNOSIS — E119 Type 2 diabetes mellitus without complications: Secondary | ICD-10-CM

## 2019-07-18 MED ORDER — METFORMIN HCL 500 MG PO TABS: 500.0000 mg | ORAL_TABLET | Freq: Two times a day (BID) | ORAL | 6 refills | Status: DC

## 2019-07-19 ENCOUNTER — Other Ambulatory Visit: Payer: Self-pay | Admitting: Family Medicine

## 2019-07-25 ENCOUNTER — Other Ambulatory Visit: Payer: Self-pay

## 2019-07-25 ENCOUNTER — Encounter: Payer: BC Managed Care – PPO | Attending: Family Medicine | Admitting: Dietician

## 2019-07-25 ENCOUNTER — Encounter: Payer: Self-pay | Admitting: Dietician

## 2019-07-25 DIAGNOSIS — E119 Type 2 diabetes mellitus without complications: Secondary | ICD-10-CM | POA: Diagnosis not present

## 2019-07-25 NOTE — Progress Notes (Signed)
Patient was seen on 07/25/19 for the first of a series of three diabetes self-management courses at the [facility].  Patient Education Plan per assessed needs and concerns is to attend three course education program for Diabetes Self Management Education.  The following learning objectives were met by the patient during this class:  Describe diabetes, types of diabetes and pathophysiology  State some common risk factors for diabetes  Defines the role of glucose and insulin  Describe the relationship between diabetes and cardiovascular and other risks  State the members of the Healthcare Team  States the rationale for glucose monitoring and when to test  State their individual Target Range  State the importance of logging glucose readings and how to interpret the readings  Identifies A1C target  Explain the correlation between A1c and eAG values  State symptoms and treatment of high blood glucose and low blood glucose  Explain proper technique for glucose testing and identify proper sharps disposal  Handouts given during class include:  How to Thrive:  A Guide for Your Journey with Diabetes by the ADA  Meal Plan Card and carbohydrate content list  Dietary intake form  Low Sodium Flavoring Tips  Types of Fats  Dining Out  Label reading  Snack list  Planning a balanced meal  The diabetes portion plate  Diabetes Resources  A1c to eAG Conversion Chart  Blood Glucose Log  Diabetes Recommended Care Schedule  Support Group  Diabetes Success Plan  Core Class Satisfaction Survey   Follow-Up Plan:  Attend core 2   

## 2019-08-01 ENCOUNTER — Encounter: Payer: BC Managed Care – PPO | Admitting: Dietician

## 2019-08-01 ENCOUNTER — Other Ambulatory Visit: Payer: Self-pay

## 2019-08-01 ENCOUNTER — Encounter: Payer: Self-pay | Admitting: Dietician

## 2019-08-01 DIAGNOSIS — E119 Type 2 diabetes mellitus without complications: Secondary | ICD-10-CM | POA: Diagnosis not present

## 2019-08-01 NOTE — Progress Notes (Signed)
Patient was seen on 08/01/2019 for the second of a series of three diabetes self-management courses at the [facility]. The following learning objectives were met by the patient during this class:   Describe the role of different macronutrients on glucose  Explain how carbohydrates affect blood glucose  State what foods contain the most carbohydrates  Demonstrate carbohydrate counting  Demonstrate how to read Nutrition Facts food label  Describe effects of various fats on heart health  Describe the importance of good nutrition for health and healthy eating strategies  Describe techniques for managing your shopping, cooking and meal planning  List strategies to follow meal plan when dining out  Describe the effects of alcohol on glucose and how to use it safely  Patient requested a meter so that he could monitor his blood glucose.  Contour Next Meter was provided (Lot- [id number], expiration 09/01/19) was provided.  Patient was instructed on it's use and was able to demonstrate.  Blood glucose was 106.  Goals:  Follow Diabetes Meal Plan as instructed  Aim to spread carbs evenly throughout the day  Aim for 3 meals per day and snacks as needed Include lean protein foods to meals/snacks  Monitor glucose levels as instructed by your doctor   Follow-Up Plan:  Attend Core 3  Work towards following your personal food plan.

## 2019-08-08 ENCOUNTER — Encounter: Payer: BC Managed Care – PPO | Attending: Family Medicine | Admitting: Dietician

## 2019-08-08 ENCOUNTER — Encounter: Payer: Self-pay | Admitting: Dietician

## 2019-08-08 ENCOUNTER — Other Ambulatory Visit: Payer: Self-pay

## 2019-08-08 DIAGNOSIS — E119 Type 2 diabetes mellitus without complications: Secondary | ICD-10-CM | POA: Diagnosis not present

## 2019-08-08 NOTE — Progress Notes (Signed)
Patient was seen on 08/08/2019 for the third of a series of three diabetes self-management courses at the [facility].   Riley Moore the amount of activity recommended for healthy living . Describe activities suitable for individual needs . Identify ways to regularly incorporate activity into daily life . Identify barriers to activity and ways to over come these barriers  Identify diabetes medications being personally used and their primary action for lowering glucose and possible side effects . Describe role of stress on blood glucose and develop strategies to address psychosocial issues . Identify diabetes complications and ways to prevent them  Explain how to manage diabetes during illness . Evaluate success in meeting personal goal . Establish 2-3 goals that they will plan to diligently work on  Goals:   I will count my carb choices at most meals and snacks  I will be active 45 minutes or more 5 times a week  I will take my diabetes medications as scheduled  I will eat less unhealthy fats   To help manage stress I will  exercise at least 5 times a week  Your patient has identified these potential barriers to change:  none  Your patient has identified their diabetes self-care support plan as  Family Support On-line Resources    Plan:  Attend Support Group as desired

## 2019-10-11 ENCOUNTER — Other Ambulatory Visit: Payer: Self-pay | Admitting: Family Medicine

## 2019-10-12 ENCOUNTER — Telehealth: Payer: Self-pay

## 2019-10-12 NOTE — Telephone Encounter (Signed)
Spoke with pt regarding virtual visit on 10/16/19. Pt agreed and confirmed his virtual visit.

## 2019-10-13 ENCOUNTER — Other Ambulatory Visit: Payer: Self-pay | Admitting: Family Medicine

## 2019-10-16 ENCOUNTER — Telehealth (INDEPENDENT_AMBULATORY_CARE_PROVIDER_SITE_OTHER): Payer: BC Managed Care – PPO | Admitting: Internal Medicine

## 2019-10-16 ENCOUNTER — Other Ambulatory Visit: Payer: Self-pay

## 2019-10-16 VITALS — Ht 74.0 in | Wt 220.0 lb

## 2019-10-16 DIAGNOSIS — I4819 Other persistent atrial fibrillation: Secondary | ICD-10-CM

## 2019-10-16 DIAGNOSIS — I10 Essential (primary) hypertension: Secondary | ICD-10-CM | POA: Diagnosis not present

## 2019-10-16 NOTE — Progress Notes (Signed)
Electrophysiology TeleHealth Note  Due to national recommendations of social distancing due to [place] 19, an audio telehealth visit is felt to be most appropriate for this patient at this time.  Verbal consent was obtained by me for the telehealth visit today.  The patient does not have capability for a virtual visit.  A phone visit is therefore required today.   Date:  10/16/2019   ID:  Riley Moore, DOB 1965-09-14, MRN [id number]  Location: patient's home  Provider location:  [place]  Evaluation Performed: Follow-up visit  PCP:  Tammi Sou, MD   Electrophysiologist:  Dr Rayann Heman  Chief Complaint:  palpitations  History of Present Illness:    Riley Moore is a 54 y.o. male who presents via telehealth conferencing today.  Since last being seen in our clinic, the patient reports doing very well.  Today, he denies symptoms of palpitations, chest pain, shortness of breath,  lower extremity edema, dizziness, presyncope, or syncope.  The patient is otherwise without complaint today.     Past Medical History:  Diagnosis Date  . Anxiety attack   . Diabetes mellitus ([place]) 07/2019   prediab 2015.  DM 2 dx 07/2019  . Elevated transaminase level    suspect combo of hyperlip/fatty liver + alcohol.  Viral Hep panel neg 2016.  Marland Kitchen Gout    multiple episodes (L MTP jt)  . History of gluten intolerance   . Hyperlipemia    Intolerant of statins: tolerates welchol  . Hypertension   . 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 Metabolic syndrome    IFG, low HDL, elev trigs: nutritionist referral 04/2014 but patient did not show for appointment.  Marland Kitchen Perennial allergic rhinitis   . Persistent atrial fibrillation ([place]) 2013; 2019   Dr. Percival Spanish.  Spont conv in ED.  Outpt eval showed nl ECHO--ASA qd and BB/CCB started.  Recurrence (asympt) 2019--needs cardioversion per cards, started on xarelto, echo fine, DC  cardioversion not successful 06/11/17-- Needs anxiety med, stop self-med with ETOH--cards d/c'd anticoag until stops ETOH. He stopped ETOH, back on xarelto and flecain 11/2018->plan for CV again.    Past Surgical History:  Procedure Laterality Date  . ATRIAL FIBRILLATION ABLATION N/A 03/31/2019   Procedure: ATRIAL FIBRILLATION ABLATION;  Surgeon: Thompson Grayer, MD;  Location: [facility];  Service: Cardiovascular;  Laterality: N/A;  . CARDIOVERSION N/A 06/11/2017   Procedure: CARDIOVERSION;  Surgeon: Skeet Latch, MD;  Location: [facility] ENDOSCOPY;  Service: Cardiovascular;  Laterality: N/A;  . CARDIOVERSION N/A 01/23/2019   Procedure: CARDIOVERSION;  Surgeon: Buford Dresser, MD;  Location: [facility] ENDOSCOPY;  Service: Cardiovascular;  Laterality: N/A;  . DC cardioversion  06/11/2017   was back in A-fib within 5 min of waking up.  . TRANSTHORACIC ECHOCARDIOGRAM  04/2012   NORMAL (ED 55-65%)    Current Outpatient Medications  Medication Sig Dispense Refill  . Ascorbic Acid (VITAMIN C) 1000 MG tablet Take 1,000 mg by mouth 2 (two) times daily.    . cetirizine (ZYRTEC) 10 MG tablet Take 10 mg by mouth at bedtime.     . cholecalciferol (VITAMIN D) 25 MCG (1000 UT) tablet Take 1,000 Units by mouth 2 (two) times daily.    . clotrimazole-betamethasone (LOTRISONE) cream Apply 1 application topically 2 (two) times daily. 45 g 2  . diltiazem (CARDIZEM CD) 180 MG 24 hr capsule TAKE 1 CAPSULE BY MOUTH EVERY DAY 90  capsule 3  . escitalopram (LEXAPRO) 10 MG tablet TAKE 1 TABLET BY MOUTH EVERY DAY 90 tablet 0  . lisinopril (ZESTRIL) 40 MG tablet Take 1 tablet (40 mg total) by mouth at bedtime. 90 tablet 1  . metFORMIN (GLUCOPHAGE) 500 MG tablet Take 1 tablet (500 mg total) by mouth 2 (two) times daily with a meal. 60 tablet 6  . Omega-3 Fatty Acids (FISH OIL) 1200 MG CAPS Take 1,200 mg by mouth 2 (two) times daily.    . Probiotic Product (PROBIOTIC DAILY PO) Take 1 tablet by mouth 2 (two) times  daily.     No current facility-administered medications for this visit.    Allergies:   Patient has no known allergies.   Social History:  The patient  reports that he has never smoked. He has never used smokeless tobacco. He reports previous alcohol use of about 4.0 - 5.0 standard drinks of alcohol per week. He reports that he does not use drugs.   ROS:  Please see the history of present illness.   All other systems are personally reviewed and negative.    Exam:    Vital Signs:  Ht 6\' 2"  (1.88 m)   Wt 220 lb (99.8 kg)   BMI 28.25 kg/m   Well sounding, alert and conversant   Labs/Other Tests and Data Reviewed:    Recent Labs: 03/28/2019: Hemoglobin 15.5; Platelets 186 07/17/2019: BUN 17; Creatinine, Ser 0.74; Potassium 4.4; Sodium 136; TSH 2.04   Wt Readings from Last 3 Encounters:  10/16/19 220 lb (99.8 kg)  07/25/19 225 lb (102.1 kg)  07/17/19 227 lb 12.8 oz (103.3 kg)      ASSESSMENT & PLAN:    1.  Persistent atrial fibrillation/ atrial flutter Doing great post ablation off AAD therapy He is taking metoprolol 50mg  BID Reduce to 25mg  BID today Not on [place] due to low chads2vasc score  2. HTN Stable Reduce metoprolol from 50mg  BID to 25mg  BID  Risks, benefits and potential toxicities for medications prescribed and/or refilled reviewed with patient today.   Follow-up:  AF clinic in 3 months I will see in 6 months   Patient Risk:  after full review of this patients clinical status, I feel that they are at moderate risk at this time.  Today, I have spent 15 minutes with the patient with telehealth technology discussing arrhythmia management .    Army Fossa, MD  10/16/2019 10:39 AM     [facility] [address] [place] [address] [phone] (office) [phone] (fax)

## 2019-10-19 ENCOUNTER — Other Ambulatory Visit: Payer: Self-pay

## 2019-10-19 ENCOUNTER — Encounter: Payer: Self-pay | Admitting: Family Medicine

## 2019-10-19 ENCOUNTER — Ambulatory Visit (INDEPENDENT_AMBULATORY_CARE_PROVIDER_SITE_OTHER): Payer: BC Managed Care – PPO | Admitting: Family Medicine

## 2019-10-19 VITALS — BP 114/76 | HR 91 | Temp 97.9°F | Resp 16 | Ht 74.0 in | Wt 222.0 lb

## 2019-10-19 DIAGNOSIS — I10 Essential (primary) hypertension: Secondary | ICD-10-CM

## 2019-10-19 DIAGNOSIS — E7800 Pure hypercholesterolemia, unspecified: Secondary | ICD-10-CM

## 2019-10-19 DIAGNOSIS — Z789 Other specified health status: Secondary | ICD-10-CM

## 2019-10-19 DIAGNOSIS — E119 Type 2 diabetes mellitus without complications: Secondary | ICD-10-CM

## 2019-10-19 LAB — HEMOGLOBIN A1C: Hgb A1c MFr Bld: 6.4 % (ref 4.6–6.5)

## 2019-10-19 LAB — LIPID PANEL
Cholesterol: 226 mg/dL — ABNORMAL HIGH (ref 0–200)
HDL: 33.4 mg/dL — ABNORMAL LOW (ref >39.00)
NonHDL: 192.51
Total CHOL/HDL Ratio: 7
Triglycerides: 276 mg/dL — ABNORMAL HIGH (ref 0.0–149.0)
VLDL: 55.2 mg/dL — ABNORMAL HIGH (ref 0.0–40.0)

## 2019-10-19 LAB — COMPREHENSIVE METABOLIC PANEL
ALT: 114 U/L — ABNORMAL HIGH (ref 0–53)
AST: 83 U/L — ABNORMAL HIGH (ref 0–37)
Albumin: 4.4 g/dL (ref 3.5–5.2)
Alkaline Phosphatase: 53 U/L (ref 39–117)
BUN: 15 mg/dL (ref 6–23)
CO2: 30 mEq/L (ref 19–32)
Calcium: 9.3 mg/dL (ref 8.4–10.5)
Chloride: 100 mEq/L (ref 96–112)
Creatinine, Ser: 0.79 mg/dL (ref 0.40–1.50)
GFR: 101.78 mL/min (ref >60.00)
Glucose, Bld: 142 mg/dL — ABNORMAL HIGH (ref 70–99)
Potassium: 4.8 mEq/L (ref 3.5–5.1)
Sodium: 136 mEq/L (ref 135–145)
Total Bilirubin: 0.7 mg/dL (ref 0.2–1.2)
Total Protein: 7.2 g/dL (ref 6.0–8.3)

## 2019-10-19 LAB — MICROALBUMIN / CREATININE URINE RATIO
Creatinine,U: 97.7 mg/dL
Microalb Creat Ratio: 6.5 mg/g (ref 0.0–30.0)
Microalb, Ur: 6.3 mg/dL — ABNORMAL HIGH (ref 0.0–1.9)

## 2019-10-19 LAB — LDL CHOLESTEROL, DIRECT: Direct LDL: 140 mg/dL

## 2019-10-19 MED ORDER — METOPROLOL TARTRATE 25 MG PO TABS: 25.0000 mg | ORAL_TABLET | Freq: Two times a day (BID) | ORAL | 3 refills | Status: DC

## 2019-10-19 NOTE — Progress Notes (Signed)
OFFICE VISIT  10/19/2019   CC:  Chief Complaint  Patient presents with   Follow-up    RCI, pt is fasting   HPI:    Patient is a 54 y.o. Caucasian male who presents for DM 2, HTN, anxiety, and hyperlipidemia (intol of statins). He has hx of a-fib, got ablation, f/u with electrophys 10/16/19 and was stable, staying off [place], lopressor decreased to 25mg  bid.  Feeling well.  Walks daily --moderate pace. He admits to still drinking about 2 beers per evening, wants to completely quit again and try to lose wt.  DM: A1c diabetic range last visit-->metformin started, nutritionist referral made. Went to nutritionist and this was helpful.  Tolerating metformin well. Has checked glucose about 5 times total, he can't recall the numbers but estimates 150 avg or so.  HTN: no home bp monitoring  HLD: hx of intol to statins.  Will recheck for another baseline today and he is agreeble to seeing Dr. Debara Pickett at lipid clinic.  ROS: no fevers, no CP, no SOB, no wheezing, no cough, no dizziness, no HAs, no rashes, no melena/hematochezia.  No polyuria or polydipsia.  No myalgias or arthralgias.  No focal weakness, paresthesias, or tremors.  No acute vision or hearing abnormalities. No n/v/d or abd pain.  No palpitations.    Past Medical History:  Diagnosis Date   Anxiety attack    Diabetes mellitus ([place]) 07/2019   prediab 2015.  DM 2 dx 07/2019   Elevated transaminase level    suspect combo of hyperlip/fatty liver + alcohol.  Viral Hep panel neg 2016.   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   Perennial allergic rhinitis    Persistent atrial  fibrillation ([place]) 2013; 2019   Dr. Percival Spanish.  Spont conv in ED.  Outpt eval showed nl ECHO--ASA qd and BB/CCB started.  Recurrence (asympt) 2019--needs cardioversion per cards, started on xarelto, echo fine, DC cardioversion not successful 06/11/17-- Needs anxiety med, stop self-med with ETOH--cards d/c'd anticoag until stops ETOH. He stopped ETOH, back on xarelto and flecain 11/2018->plan for CV again.    Past Surgical History:  Procedure Laterality Date   ATRIAL FIBRILLATION ABLATION N/A 03/31/2019   Procedure: ATRIAL FIBRILLATION ABLATION;  Surgeon: Thompson Grayer, MD;  Location: [place];  Service: Cardiovascular;  Laterality: N/A;   CARDIOVERSION N/A 06/11/2017   Procedure: CARDIOVERSION;  Surgeon: Skeet Latch, MD;  Location: [facility];  Service: Cardiovascular;  Laterality: N/A;   CARDIOVERSION N/A 01/23/2019   Procedure: CARDIOVERSION;  Surgeon: Buford Dresser, MD;  Location: [facility] ENDOSCOPY;  Service: Cardiovascular;  Laterality: N/A;   DC cardioversion  06/11/2017   was back in A-fib within 5 min of waking up.   TRANSTHORACIC ECHOCARDIOGRAM  04/2012   NORMAL (ED 55-65%)    Outpatient Medications Prior to Visit  Medication Sig Dispense Refill   Ascorbic Acid (VITAMIN C) 1000 MG tablet Take 1,000 mg by mouth 2 (two) times daily.     cetirizine (ZYRTEC) 10 MG tablet Take 10 mg by mouth at bedtime.      cholecalciferol (VITAMIN D) 25 MCG (1000 UT) tablet  Take 1,000 Units by mouth 2 (two) times daily.     clotrimazole-betamethasone (LOTRISONE) cream Apply 1 application topically 2 (two) times daily. 45 g 2   diltiazem (CARDIZEM CD) 180 MG 24 hr capsule TAKE 1 CAPSULE BY MOUTH EVERY DAY 90 capsule 3   escitalopram (LEXAPRO) 10 MG tablet TAKE 1 TABLET BY MOUTH EVERY DAY 90 tablet 0   lisinopril (ZESTRIL) 40 MG tablet Take 1 tablet (40 mg total) by mouth at bedtime. 90 tablet 1   metFORMIN (GLUCOPHAGE) 500 MG tablet Take 1 tablet (500 mg total) by mouth 2  (two) times daily with a meal. 60 tablet 6   Omega-3 Fatty Acids (FISH OIL) 1200 MG CAPS Take 1,200 mg by mouth 2 (two) times daily.     Probiotic Product (PROBIOTIC DAILY PO) Take 1 tablet by mouth 2 (two) times daily.     metoprolol tartrate (LOPRESSOR) 25 MG tablet Take 25 mg by mouth 2 (two) times daily.     No facility-administered medications prior to visit.    No Known Allergies  ROS As per HPI  PE: Vitals:   10/19/19 0813  BP: 114/76  Pulse: 91  Resp: 16  Temp: 97.9 F (36.6 C)  SpO2: 96%    Gen: Alert, well appearing.  Patient is oriented to person, place, time, and situation. AFFECT: pleasant, lucid thought and speech. CV: RRR, no m/r/g.   LUNGS: CTA bilat, nonlabored resps, good aeration in all lung fields. EXT: no clubbing or cyanosis.  no edema.    LABS:  Lab Results  Component Value Date   TSH 2.04 07/17/2019   Lab Results  Component Value Date   WBC 10.3 03/28/2019   HGB 15.5 03/28/2019   HCT 44.0 03/28/2019   MCV 90 03/28/2019   PLT 186 03/28/2019   Lab Results  Component Value Date   CREATININE 0.74 07/17/2019   BUN 17 07/17/2019   NA 136 07/17/2019   K 4.4 07/17/2019   CL 100 07/17/2019   CO2 27 07/17/2019   Lab Results  Component Value Date   ALT 56 (H) 06/10/2018   AST 31 06/10/2018   ALKPHOS 59 06/10/2018   BILITOT 0.7 06/10/2018   Lab Results  Component Value Date   CHOL 214 (H) 07/17/2019   Lab Results  Component Value Date   HDL 33.70 (L) 07/17/2019   Lab Results  Component Value Date   LDLCALC 133 (H) 04/26/2017   Lab Results  Component Value Date   TRIG 257.0 (H) 07/17/2019   Lab Results  Component Value Date   CHOLHDL 6 07/17/2019   Lab Results  Component Value Date   PSA 0.33 10/30/2014   Lab Results  Component Value Date   HGBA1C 7.1 (H) 07/17/2019    IMPRESSION AND PLAN:  1) DM 2, recent dx. Tolerating metformin. Changing eating habits but needs to step-up CV exercise and work on wt  loss. Needs to stop drinking beer. HbA1c and urine microalb/cr today. Will get him referred for diab retpthy screening exam next f/u visit.  2) HTN: The current medical regimen is effective;  continue present plan and medications. Lytes/cr today. Needs lipid clinic referral.  3) Hyperchol: intol of statins.  Lipid clinic referral ordered.  4) A-fib: doing well s/p ablation.  No OAC per electrophys. Doing well on lower dose of lopressor (RF'd 25mg  bid today).  5) Anxiety: stable on lexapro 10mg  qd.  An After Visit Summary was printed and given to the patient.  FOLLOW  UP: Return in about 3 months (around 01/19/2020) for annual CPE (fasting).  Signed:  Crissie Sickles, MD           10/19/2019

## 2019-10-27 ENCOUNTER — Other Ambulatory Visit: Payer: Self-pay | Admitting: Family Medicine

## 2019-11-20 ENCOUNTER — Other Ambulatory Visit: Payer: Self-pay | Admitting: Family Medicine

## 2019-11-23 ENCOUNTER — Encounter: Payer: Self-pay | Admitting: Family Medicine

## 2019-11-24 ENCOUNTER — Other Ambulatory Visit: Payer: Self-pay

## 2019-12-02 ENCOUNTER — Other Ambulatory Visit: Payer: Self-pay | Admitting: Family Medicine

## 2019-12-19 ENCOUNTER — Encounter: Payer: Self-pay | Admitting: Internal Medicine

## 2019-12-19 ENCOUNTER — Telehealth (INDEPENDENT_AMBULATORY_CARE_PROVIDER_SITE_OTHER): Payer: BC Managed Care – PPO | Admitting: Internal Medicine

## 2019-12-19 VITALS — BP 120/80 | Wt 217.0 lb

## 2019-12-19 DIAGNOSIS — I10 Essential (primary) hypertension: Secondary | ICD-10-CM | POA: Diagnosis not present

## 2019-12-19 DIAGNOSIS — I2510 Atherosclerotic heart disease of native coronary artery without angina pectoris: Secondary | ICD-10-CM

## 2019-12-19 DIAGNOSIS — E785 Hyperlipidemia, unspecified: Secondary | ICD-10-CM | POA: Diagnosis not present

## 2019-12-19 MED ORDER — ROSUVASTATIN CALCIUM 20 MG PO TABS
20.0000 mg | ORAL_TABLET | Freq: Every day | ORAL | 3 refills | Status: DC
Start: 2019-12-19 — End: 2020-04-22

## 2019-12-19 NOTE — Progress Notes (Signed)
Virtual Visit via Telephone Note   This visit type was conducted due to national recommendations for restrictions regarding the COVID-19 Pandemic (e.g. social distancing) in an effort to limit this patient's exposure and mitigate transmission in our community.  Due to his co-morbid illnesses, this patient is at least at moderate risk for complications without adequate follow up.  This format is felt to be most appropriate for this patient at this time.  The patient did not have access to video technology/had technical difficulties with video requiring transitioning to audio format only (telephone).  All issues noted in this document were discussed and addressed.  No physical exam could be performed with this format.  Please refer to the patient's chart for his  consent to telehealth for [facility].   Date:  12/19/2019   ID:  Riley Moore, DOB 1965/11/06, MRN [id number] The patient was identified using 2 identifiers.  Evaluation Performed:  New Patient Evaluation  Patient Location:  [address] [place] [address]  Provider location:   [address] [place], [place] [address]  PCP:  Tammi Sou, MD  Cardiologist:  Minus Breeding, MD Electrophysiologist:  None   Chief Complaint:  Manage dyslipidemia  History of Present Illness:    Riley Moore is a 54 y.o. male who presents via audio/video conferencing for a telehealth visit today.  This is a 54 year old male who has a history of atrial fibrillation/flutter status post ablation, dyslipidemia, hypertension and type 2 diabetes who reported a history of statin intolerance in the past, having tried pravastatin years ago which caused him to feel wiped out, weak, with no energy.  He denied any myalgias specifically.  In preparation for his ablation, he underwent a CT which did demonstrate a calcium score of 2 and mild nonobstructive circumflex disease.  Dyslipidemia runs in his family including his mother.  He recently had  made some dietary changes to improve his A1c, however cholesterol 2 months ago was still elevated 226, triglycerides 276, HDL 33 and a direct LDL of 135.  The patient does not have symptoms concerning for COVID-19 infection (fever, chills, cough, or new SHORTNESS OF BREATH).    Prior CV studies:   The following studies were reviewed today:  Lab work  PMHx:  Past Medical History:  Diagnosis Date  . Anxiety attack   . Diabetes mellitus ([place]) 07/2019   prediab 2015.  DM 2 dx 07/2019  . Elevated transaminase level    suspect combo of hyperlip/fatty liver + alcohol.  Viral Hep panel neg 2016.  Marland Kitchen Gout    multiple episodes (L MTP jt)  . History of gluten intolerance   . Hyperlipemia    Intolerant of statins: tolerates welchol  . Hypertension   . 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 Metabolic syndrome    IFG, low HDL, elev trigs: nutritionist referral 04/2014 but patient did not show for appointment.  Marland Kitchen Perennial allergic rhinitis   . Persistent atrial fibrillation ([place]) 2013; 2019   Dr. Percival Spanish.  Spont conv in ED.  Outpt eval showed nl ECHO--ASA qd and BB/CCB started.  Multiple cardioversions and recurrences, finally got ablation procedure.03/2019--no [place] b/c CHADVASC score low.    Past Surgical History:  Procedure Laterality Date  . ATRIAL FIBRILLATION ABLATION N/A 03/31/2019   Procedure: ATRIAL FIBRILLATION ABLATION;  Surgeon: Thompson Grayer, MD;  Location: [facility];  Service: Cardiovascular;  Laterality: N/A;  . CARDIOVERSION N/A 06/11/2017   Procedure: CARDIOVERSION;  Surgeon: Skeet Latch, MD;  Location: [facility] ENDOSCOPY;  Service: Cardiovascular;  Laterality: N/A;  . CARDIOVERSION N/A 01/23/2019   Procedure: CARDIOVERSION;  Surgeon: Buford Dresser, MD;  Location: [facility] ENDOSCOPY;  Service: Cardiovascular;  Laterality: N/A;  . DC cardioversion  06/11/2017   was back in A-fib within 5 min  of waking up.  . TRANSTHORACIC ECHOCARDIOGRAM  04/2012   NORMAL (ED 55-65%)    FAMHx:  Family History  Problem Relation Age of Onset  . Heart disease Father        pacemaker, atrial fibrillation  . Hypertension Father     SOCHx:   reports that he has never smoked. He has never used smokeless tobacco. He reports previous alcohol use of about 4.0 - 5.0 standard drinks of alcohol per week. He reports that he does not use drugs.  ALLERGIES:  Allergies  Allergen Reactions  . Pravastatin     Decreased energy, "wiped out"    MEDS:  Current Meds  Medication Sig  . Ascorbic Acid (VITAMIN C) 1000 MG tablet Take 1,000 mg by mouth 2 (two) times daily.  . cetirizine (ZYRTEC) 10 MG tablet Take 10 mg by mouth at bedtime.   . cholecalciferol (VITAMIN D) 25 MCG (1000 UT) tablet Take 1,000 Units by mouth 2 (two) times daily.  . clotrimazole-betamethasone (LOTRISONE) cream APPLY TO AFFECTED AREA TWICE A DAY  . diltiazem (CARDIZEM CD) 180 MG 24 hr capsule TAKE 1 CAPSULE BY MOUTH EVERY DAY  . escitalopram (LEXAPRO) 10 MG tablet TAKE 1 TABLET BY MOUTH EVERY DAY  . lisinopril (ZESTRIL) 40 MG tablet TAKE 1 TABLET BY MOUTH EVERYDAY AT BEDTIME  . metFORMIN (GLUCOPHAGE) 500 MG tablet Take 1 tablet (500 mg total) by mouth 2 (two) times daily with a meal.  . metoprolol tartrate (LOPRESSOR) 25 MG tablet Take 1 tablet (25 mg total) by mouth 2 (two) times daily.  . Omega-3 Fatty Acids (FISH OIL) 1200 MG CAPS Take 1,200 mg by mouth 2 (two) times daily.  . Probiotic Product (PROBIOTIC DAILY PO) Take 1 tablet by mouth daily as needed.      ROS: Pertinent items noted in HPI and remainder of comprehensive ROS otherwise negative.  Labs/Other Tests and Data Reviewed:    Recent Labs: 03/28/2019: Hemoglobin 15.5; Platelets 186 07/17/2019: TSH 2.04 10/19/2019: ALT 114; BUN 15; Creatinine, Ser 0.79; Potassium 4.8; Sodium 136   Recent Lipid Panel Lab Results  Component Value Date/Time   CHOL 226 (H)  10/19/2019 08:49 AM   TRIG 276.0 (H) 10/19/2019 08:49 AM   HDL 33.40 (L) 10/19/2019 08:49 AM   CHOLHDL 7 10/19/2019 08:49 AM   LDLCALC 133 (H) 04/26/2017 08:43 AM   LDLDIRECT 140.0 10/19/2019 08:49 AM    Wt Readings from Last 3 Encounters:  12/19/19 217 lb (98.4 kg)  10/19/19 222 lb (100.7 kg)  10/16/19 220 lb (99.8 kg)     Exam:    Vital Signs:  BP 120/80   Wt 217 lb (98.4 kg)   BMI 27.86 kg/m    Exam not performed due to telephone visit  ASSESSMENT & PLAN:    1. Dyslipidemia, goal LDL less than 100 2. A. fib status post ablation 3. 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 Hypertension 5. Type 2 diabetes-hemoglobin A1c 6.4  Mr. Cordrey has dyslipidemia but a relatively low risk CT scan and mild nonobstructive coronary disease.  He has well-controlled type  2 diabetes.  He could not tolerate pravastatin in the past due to feeling weak but has not tried other statins.  I would like for him to try high potency rosuvastatin 20 mg daily.  If he has similar side effects or is not tolerated we can consider other options.  Plan repeat lipids in about 3 months.  Thanks again for the kind referral.  COVID-19 Education: The signs and symptoms of COVID-19 were discussed with the patient and how to seek care for testing (follow up with PCP or arrange E-visit).  The importance of social distancing was discussed today.  Patient Risk:   After full review of this patients clinical status, I feel that they are at least moderate risk at this time.  Time:   Today, I have spent 25 minutes with the patient with telehealth technology discussing dyslipidemia, statin intolerance, coronary calcium/CT results.     Medication Adjustments/Labs and Tests Ordered: Current medicines are reviewed at length with the patient today.  Concerns regarding medicines are outlined above.   Tests Ordered: Orders Placed This Encounter  Procedures  . Lipid panel    Medication  Changes: Meds ordered this encounter  Medications  . rosuvastatin (CRESTOR) 20 MG tablet    Sig: Take 1 tablet (20 mg total) by mouth daily.    Dispense:  90 tablet    Refill:  3    Disposition:  in 3 month(s)  Pixie Casino, MD, [facility], [place] Director of the Advanced Lipid Disorders &  Cardiovascular Risk Reduction Clinic Diplomate of the American Board of Clinical Lipidology Attending Cardiologist  Direct Dial: [phone]  Fax: [phone]  Website:  [email or web address]  Pixie Casino, MD  12/19/2019 8:20 AM

## 2019-12-19 NOTE — Patient Instructions (Signed)
Medication Instructions:  START crestor 20mg  daily  *If you need a refill on your cardiac medications before your next appointment, please call your pharmacy*   Lab Work: FASTING lipid panel due 1 week before your next appointment with Dr. Debara Pickett at any [facility]  If you have labs (blood work) drawn today and your tests are completely normal, you will receive your results only by: Marland Kitchen MyChart Message (if you have MyChart) OR . A paper copy in the mail If you have any lab test that is abnormal or we need to change your treatment, we will call you to review the results.   Testing/Procedures: NONE   Follow-Up: At [facility], you and your health needs are our priority.  As part of our continuing mission to provide you with exceptional heart care, we have created designated Provider Care Teams.  These Care Teams include your primary Cardiologist (physician) and Advanced Practice Providers (APPs -  Physician Assistants and Nurse Practitioners) who all work together to provide you with the care you need, when you need it.  We recommend signing up for the patient portal called "MyChart".  Sign up information is provided on this After Visit Summary.  MyChart is used to connect with patients for Virtual Visits (Telemedicine).  Patients are able to view lab/test results, encounter notes, upcoming appointments, etc.  Non-urgent messages can be sent to your provider as well.   To learn more about what you can do with MyChart, go to [email or web address].    Your next appointment:   3 month(s) - lipid clinic  The format for your next appointment:   In Person or Virtual   Provider:   K. Mali Hilty, MD   Other Instructions

## 2020-01-03 ENCOUNTER — Other Ambulatory Visit: Payer: Self-pay | Admitting: Family Medicine

## 2020-01-05 ENCOUNTER — Other Ambulatory Visit: Payer: Self-pay | Admitting: Family Medicine

## 2020-01-10 ENCOUNTER — Other Ambulatory Visit: Payer: Self-pay | Admitting: Family Medicine

## 2020-01-28 ENCOUNTER — Other Ambulatory Visit: Payer: Self-pay | Admitting: Family Medicine

## 2020-01-29 NOTE — Telephone Encounter (Signed)
Pt scheduled for Friday 10/01

## 2020-02-02 ENCOUNTER — Ambulatory Visit: Payer: BC Managed Care – PPO | Admitting: Family Medicine

## 2020-02-02 NOTE — Progress Notes (Deleted)
OFFICE VISIT  02/02/2020  CC: No chief complaint on file.  HPI:    Patient is a 54 y.o. Caucasian male who presents for f/u DM 2, HTN, HLD. I last saw him about 4 mo ago. A/P as of last visit: "1) DM 2, recent dx. Tolerating metformin. Changing eating habits but needs to step-up CV exercise and work on wt loss. Needs to stop drinking beer. HbA1c and urine microalb/cr today. Will get him referred for diab retpthy screening exam next f/u visit.  2) HTN: The current medical regimen is effective;  continue present plan and medications. Lytes/cr today. Needs lipid clinic referral.  3) Hyperchol: intol of statins.  Lipid clinic referral ordered.  4) A-fib: doing well s/p ablation.  No OAC per electrophys. Doing well on lower dose of lopressor (RF'd 25mg  bid today).  5) Anxiety: stable on lexapro 10mg  qd."  INTERIM HX: Lab note by me last visit: "HbA1c improved to 6.4%---great job. Continue metformin and diabetic diet/increase exercise. His liver enzymes are still mildly elevated as they have been long term---this is likely a combination of alcohol effect AND the effect that high cholesterol and diabetes has on the liver (causes "fatty liver"). Encourage complete alcohol cessation".  ***  Past Medical History:  Diagnosis Date  . Anxiety attack   . Diabetes mellitus ([place]) 07/2019   prediab 2015.  DM 2 dx 07/2019  . Elevated transaminase level    suspect combo of hyperlip/fatty liver + alcohol.  Viral Hep panel neg 2016.  Marland Kitchen Gout    multiple episodes (L MTP jt)  . History of gluten intolerance   . Hyperlipemia    Intolerant of statins: tolerates welchol  . Hypertension   . 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 Metabolic syndrome    IFG, low HDL, elev trigs: nutritionist referral 04/2014 but patient did not show for appointment.  Marland Kitchen Perennial allergic rhinitis   . Persistent atrial fibrillation  ([place]) 2013; 2019   Dr. Percival Spanish.  Spont conv in ED.  Outpt eval showed nl ECHO--ASA qd and BB/CCB started.  Multiple cardioversions and recurrences, finally got ablation procedure.03/2019--no [place] b/c CHADVASC score low.    Past Surgical History:  Procedure Laterality Date  . ATRIAL FIBRILLATION ABLATION N/A 03/31/2019   Procedure: ATRIAL FIBRILLATION ABLATION;  Surgeon: Thompson Grayer, MD;  Location: [place] [facility];  Service: Cardiovascular;  Laterality: N/A;  . CARDIOVERSION N/A 06/11/2017   Procedure: CARDIOVERSION;  Surgeon: Skeet Latch, MD;  Location: [facility] ENDOSCOPY;  Service: Cardiovascular;  Laterality: N/A;  . CARDIOVERSION N/A 01/23/2019   Procedure: CARDIOVERSION;  Surgeon: Buford Dresser, MD;  Location: [facility] ENDOSCOPY;  Service: Cardiovascular;  Laterality: N/A;  . DC cardioversion  06/11/2017   was back in A-fib within 5 min of waking up.  . TRANSTHORACIC ECHOCARDIOGRAM  04/2012   NORMAL (ED 55-65%)    Outpatient Medications Prior to Visit  Medication Sig Dispense Refill  . Ascorbic Acid (VITAMIN C) 1000 MG tablet Take 1,000 mg by mouth 2 (two) times daily.    . cetirizine (ZYRTEC) 10 MG tablet Take 10 mg by mouth at bedtime.     . cholecalciferol (VITAMIN D) 25 MCG (1000 UT) tablet Take 1,000 Units by mouth 2 (two) times daily.    . clotrimazole-betamethasone (LOTRISONE) cream APPLY TO AFFECTED AREA TWICE A DAY 15 g 0  . diltiazem (CARDIZEM CD) 180 MG 24 hr capsule TAKE 1 CAPSULE BY  MOUTH EVERY DAY 90 capsule 3  . escitalopram (LEXAPRO) 10 MG tablet TAKE 1 TABLET BY MOUTH EVERY DAY 30 tablet 0  . lisinopril (ZESTRIL) 40 MG tablet TAKE 1 TABLET BY MOUTH EVERYDAY AT BEDTIME 90 tablet 1  . metFORMIN (GLUCOPHAGE) 500 MG tablet TAKE 1 TABLET (500 MG TOTAL) BY MOUTH 2 (TWO) TIMES DAILY WITH A MEAL. 180 tablet 1  . metoprolol tartrate (LOPRESSOR) 25 MG tablet Take 1 tablet (25 mg total) by mouth 2 (two) times daily. 180 tablet 3  . Omega-3 Fatty Acids (FISH OIL) 1200 MG  CAPS Take 1,200 mg by mouth 2 (two) times daily.    . Probiotic Product (PROBIOTIC DAILY PO) Take 1 tablet by mouth daily as needed.     . rosuvastatin (CRESTOR) 20 MG tablet Take 1 tablet (20 mg total) by mouth daily. 90 tablet 3   No facility-administered medications prior to visit.    Allergies  Allergen Reactions  . Pravastatin     Decreased energy, "wiped out"    ROS As per HPI  PE: Vitals with BMI 12/19/2019 10/19/2019 10/16/2019  Height - 6\' 2"  6\' 2"   Weight 217 lbs 222 lbs 220 lbs  BMI - 49.67 59.16  Systolic 384 665 -  Diastolic 80 76 -  Pulse - 91 -     ***  LABS:  Lab Results  Component Value Date   TSH 2.04 07/17/2019   Lab Results  Component Value Date   WBC 10.3 03/28/2019   HGB 15.5 03/28/2019   HCT 44.0 03/28/2019   MCV 90 03/28/2019   PLT 186 03/28/2019   Lab Results  Component Value Date   CREATININE 0.79 10/19/2019   BUN 15 10/19/2019   NA 136 10/19/2019   K 4.8 10/19/2019   CL 100 10/19/2019   CO2 30 10/19/2019   Lab Results  Component Value Date   ALT 114 (H) 10/19/2019   AST 83 (H) 10/19/2019   ALKPHOS 53 10/19/2019   BILITOT 0.7 10/19/2019   Lab Results  Component Value Date   CHOL 226 (H) 10/19/2019   Lab Results  Component Value Date   HDL 33.40 (L) 10/19/2019   Lab Results  Component Value Date   LDLCALC 133 (H) 04/26/2017   Lab Results  Component Value Date   TRIG 276.0 (H) 10/19/2019   Lab Results  Component Value Date   CHOLHDL 7 10/19/2019   Lab Results  Component Value Date   PSA 0.33 10/30/2014   Lab Results  Component Value Date   HGBA1C 6.4 10/19/2019    IMPRESSION AND PLAN:  No problem-specific Assessment & Plan notes found for this encounter.  Feet exam Pneumovax, flu vax Ref for eye exam.  An After Visit Summary was printed and given to the patient.  FOLLOW UP: No follow-ups on file.  Signed:  Crissie Sickles, MD           02/02/2020

## 2020-02-10 ENCOUNTER — Other Ambulatory Visit: Payer: Self-pay | Admitting: Family Medicine

## 2020-02-21 ENCOUNTER — Telehealth: Payer: Self-pay

## 2020-02-21 ENCOUNTER — Other Ambulatory Visit: Payer: Self-pay | Admitting: Family Medicine

## 2020-02-21 NOTE — Telephone Encounter (Signed)
Patient refill request.    escitalopram (LEXAPRO) 10 MG tablet [[id number]]    clotrimazole-betamethasone (LOTRISONE) cream [[id number]]    [facility] [address]    I told him that call request will not be reviewed today because time of call is after 4:30PM. His call will be followed up tomorrow 02/22/20.  He said okay.Marland Kitchen

## 2020-02-22 ENCOUNTER — Other Ambulatory Visit: Payer: Self-pay

## 2020-02-22 MED ORDER — ESCITALOPRAM OXALATE 10 MG PO TABS
10.0000 mg | ORAL_TABLET | Freq: Every day | ORAL | 0 refills | Status: DC
Start: 2020-02-22 — End: 2020-04-05

## 2020-02-22 MED ORDER — CLOTRIMAZOLE-BETAMETHASONE 1-0.05 % EX CREA
TOPICAL_CREAM | CUTANEOUS | 0 refills | Status: DC
Start: 2020-02-22 — End: 2020-03-06

## 2020-02-22 NOTE — Telephone Encounter (Signed)
Refills sent for 30 day supply. Patient is due for appointment, this can be yearly physical or follow up. Please assist with scheduling, thanks.

## 2020-02-22 NOTE — Telephone Encounter (Signed)
Contacted patient and scheduled followup OV for 03/06/20.

## 2020-03-06 ENCOUNTER — Encounter: Payer: Self-pay | Admitting: Family Medicine

## 2020-03-06 ENCOUNTER — Other Ambulatory Visit: Payer: Self-pay

## 2020-03-06 ENCOUNTER — Ambulatory Visit: Payer: BC Managed Care – PPO | Admitting: Family Medicine

## 2020-03-06 VITALS — BP 104/71 | HR 74 | Temp 97.6°F | Resp 16 | Ht 74.0 in | Wt 223.8 lb

## 2020-03-06 DIAGNOSIS — I4891 Unspecified atrial fibrillation: Secondary | ICD-10-CM

## 2020-03-06 DIAGNOSIS — I10 Essential (primary) hypertension: Secondary | ICD-10-CM

## 2020-03-06 DIAGNOSIS — R7401 Elevation of levels of liver transaminase levels: Secondary | ICD-10-CM

## 2020-03-06 DIAGNOSIS — E119 Type 2 diabetes mellitus without complications: Secondary | ICD-10-CM | POA: Diagnosis not present

## 2020-03-06 DIAGNOSIS — E7800 Pure hypercholesterolemia, unspecified: Secondary | ICD-10-CM

## 2020-03-06 DIAGNOSIS — B351 Tinea unguium: Secondary | ICD-10-CM

## 2020-03-06 DIAGNOSIS — S39840A Fracture of corpus cavernosum penis, initial encounter: Secondary | ICD-10-CM

## 2020-03-06 LAB — CBC
HCT: 45 % (ref 39.0–52.0)
Hemoglobin: 15.5 g/dL (ref 13.0–17.0)
MCHC: 34.4 g/dL (ref 30.0–36.0)
MCV: 92.7 fl (ref 78.0–100.0)
Platelets: 177 10*3/uL (ref 150.0–400.0)
RBC: 4.86 Mil/uL (ref 4.22–5.81)
RDW: 13.2 % (ref 11.5–15.5)
WBC: 8.4 10*3/uL (ref 4.0–10.5)

## 2020-03-06 LAB — MAGNESIUM: Magnesium: 1.9 mg/dL (ref 1.5–2.5)

## 2020-03-06 LAB — COMPREHENSIVE METABOLIC PANEL
ALT: 108 U/L — ABNORMAL HIGH (ref 0–53)
AST: 83 U/L — ABNORMAL HIGH (ref 0–37)
Albumin: 4.4 g/dL (ref 3.5–5.2)
Alkaline Phosphatase: 58 U/L (ref 39–117)
BUN: 15 mg/dL (ref 6–23)
CO2: 26 mEq/L (ref 19–32)
Calcium: 8.9 mg/dL (ref 8.4–10.5)
Chloride: 98 mEq/L (ref 96–112)
Creatinine, Ser: 0.71 mg/dL (ref 0.40–1.50)
GFR: 103.28 mL/min (ref >60.00)
Glucose, Bld: 158 mg/dL — ABNORMAL HIGH (ref 70–99)
Potassium: 4.2 mEq/L (ref 3.5–5.1)
Sodium: 133 mEq/L — ABNORMAL LOW (ref 135–145)
Total Bilirubin: 0.6 mg/dL (ref 0.2–1.2)
Total Protein: 7 g/dL (ref 6.0–8.3)

## 2020-03-06 LAB — HEMOGLOBIN A1C: Hgb A1c MFr Bld: 7.1 % — ABNORMAL HIGH (ref 4.6–6.5)

## 2020-03-06 MED ORDER — RIVAROXABAN 20 MG PO TABS: 20.0000 mg | ORAL_TABLET | Freq: Every day | ORAL | 1 refills | Status: DC

## 2020-03-06 MED ORDER — CICLOPIROX 8 % EX SOLN: Freq: Every day | CUTANEOUS | 6 refills | Status: DC

## 2020-03-06 MED ORDER — CLOTRIMAZOLE-BETAMETHASONE 1-0.05 % EX CREA
TOPICAL_CREAM | CUTANEOUS | 6 refills | Status: DC
Start: 2020-03-06 — End: 2020-10-31

## 2020-03-06 NOTE — Patient Instructions (Signed)
Call Dr. Rayann Heman to make an appointment as soon as possible: Phone: [phone].

## 2020-03-06 NOTE — Progress Notes (Signed)
OFFICE VISIT  03/06/2020  CC:  Chief Complaint  Patient presents with   Follow-up    RCI, pt is not fasting.    HPI:    Patient is a 54 y.o. Caucasian male who presents for 5 mo f/u HTN, DM, HLD plus c/o toenail fungus. Has hx of persistent A-fib, has undergone ablation and subsequently has been on lopressor 25mg  bid and dilt 180 qd, but no anticoagulant (low CHAD2VasC score). Hx of alc abuse/self treatment for anxiety.   Says he feels fine, working a lot/very busy. HLD: hx of prava intol, + hx of elev LFTs-->referred him to advanced lipid clinic last visit 5 mo ago.  He was started on rosuva 20 about 2.5 mo ago by Dr. Debara Pickett.  DM: taking metformin 500mg  bid. He continues to abstain from alcohol. No exercise but he's not sedentary.  HTN: occ home bp checks in the past have been normal.  His watch tells him periodically alarm him that he is in a-fib -->for months.  He feels no palp's, HR abnormality, CP, SOB, or dizziness.  R foot great toe with thickening, white substance beneath nail, years.  No pain or itching.  ROS: no fevers, no CP, no SOB, no wheezing, no cough, no dizziness, no HAs, no rashes, no melena/hematochezia.  No polyuria or polydipsia.  No myalgias or arthralgias.  No focal weakness, paresthesias, or tremors.  No acute vision or hearing abnormalities. No n/v/d or abd pain.  No palpitations.    Past Medical History:  Diagnosis Date   Anxiety attack    Diabetes mellitus ([place]) 07/2019   prediab 2015.  DM 2 dx 07/2019   Elevated transaminase level    suspect combo of hyperlip/fatty liver + alcohol.  Viral Hep panel neg 2016.   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 Percival Spanish.  Spont conv in ED.  Outpt eval showed nl ECHO--ASA qd and BB/CCB started.  Multiple cardioversions and recurrences, finally got ablation procedure.03/2019--no [place] b/c CHADVASC score low.    Past Surgical History:  Procedure Laterality Date   ATRIAL FIBRILLATION ABLATION N/A 03/31/2019   Procedure: ATRIAL FIBRILLATION ABLATION;  Surgeon: Thompson Grayer, MD;  Location: [place];  Service: Cardiovascular;  Laterality: N/A;   CARDIOVERSION N/A 06/11/2017   Procedure: CARDIOVERSION;  Surgeon: Skeet Latch, MD;  Location: [facility];  Service: Cardiovascular;  Laterality: N/A;   CARDIOVERSION N/A 01/23/2019   Procedure: CARDIOVERSION;  Surgeon: Buford Dresser, MD;  Location: [facility] ENDOSCOPY;  Service: Cardiovascular;  Laterality: N/A;   DC cardioversion  06/11/2017   was back in A-fib within 5 min of waking up.   TRANSTHORACIC ECHOCARDIOGRAM  04/2012   NORMAL (ED 55-65%)    Outpatient Medications Prior to Visit  Medication Sig Dispense Refill   cholecalciferol (VITAMIN D) 25 MCG (1000 UT) tablet Take 1,000 Units by mouth 2 (two) times daily.     diltiazem (CARDIZEM CD) 180 MG 24 hr capsule TAKE 1 CAPSULE BY MOUTH EVERY DAY 90 capsule 3   escitalopram (LEXAPRO) 10 MG tablet Take 1 tablet (10 mg total) by mouth daily. 30 tablet 0   lisinopril (ZESTRIL) 40  MG tablet TAKE 1 TABLET BY MOUTH EVERYDAY AT BEDTIME 90 tablet 1   metFORMIN (GLUCOPHAGE) 500 MG tablet TAKE 1 TABLET (500 MG TOTAL) BY MOUTH 2 (TWO) TIMES DAILY WITH A MEAL. 180 tablet 1   metoprolol tartrate (LOPRESSOR) 25 MG tablet Take 1 tablet (25 mg total) by mouth 2 (two) times daily. 180 tablet 3   Omega-3 Fatty Acids (FISH OIL) 1200 MG CAPS Take 1,200 mg by mouth 2 (two) times daily.     rosuvastatin (CRESTOR) 20 MG tablet Take 1 tablet (20 mg total) by mouth daily. 90 tablet 3   clotrimazole-betamethasone (LOTRISONE) cream APPLY TO AFFECTED AREA TWICE A DAY 15 g 0   Ascorbic  Acid (VITAMIN C) 1000 MG tablet Take 1,000 mg by mouth 2 (two) times daily. (Patient not taking: Reported on 03/06/2020)     cetirizine (ZYRTEC) 10 MG tablet Take 10 mg by mouth at bedtime.  (Patient not taking: Reported on 03/06/2020)     Probiotic Product (PROBIOTIC DAILY PO) Take 1 tablet by mouth daily as needed.  (Patient not taking: Reported on 03/06/2020)     No facility-administered medications prior to visit.    Allergies  Allergen Reactions   Pravastatin     Decreased energy, "wiped out"    ROS As per HPI  PE: Vitals with BMI 03/06/2020 12/19/2019 10/19/2019  Height 6\' 2"  - 6\' 2"   Weight 223 lbs 13 oz 217 lbs 222 lbs  BMI 63.01 - 60.10  Systolic 932 355 732  Diastolic 71 80 76  Pulse 74 - 91     Gen: Alert, well appearing.  Patient is oriented to person, place, time, and situation. AFFECT: pleasant, lucid thought and speech. CV: irreg irreg, rate about 85, no m/r/g. Chest is clear, no wheezing or rales. Normal symmetric air entry throughout both lung fields. No chest wall deformities or tenderness. EXT: no clubbing or cyanosis.  no edema.  R great toenail with onychomycotic changes   LABS:  Lab Results  Component Value Date   TSH 2.04 07/17/2019   Lab Results  Component Value Date   WBC 8.4 03/06/2020   HGB 15.5 03/06/2020   HCT 45.0 03/06/2020   MCV 92.7 03/06/2020   PLT 177.0 03/06/2020   Lab Results  Component Value Date   CREATININE 0.71 03/06/2020   BUN 15 03/06/2020   NA 133 (L) 03/06/2020   K 4.2 03/06/2020   CL 98 03/06/2020   CO2 26 03/06/2020   Lab Results  Component Value Date   ALT 108 (H) 03/06/2020   AST 83 (H) 03/06/2020   ALKPHOS 58 03/06/2020   BILITOT 0.6 03/06/2020   Lab Results  Component Value Date   CHOL 226 (H) 10/19/2019   Lab Results  Component Value Date   HDL 33.40 (L) 10/19/2019   Lab Results  Component Value Date   LDLCALC 133 (H) 04/26/2017   Lab Results  Component Value Date   TRIG 276.0 (H) 10/19/2019    Lab Results  Component Value Date   CHOLHDL 7 10/19/2019   Lab Results  Component Value Date   PSA 0.33 10/30/2014   Lab Results  Component Value Date   HGBA1C 7.1 (H) 03/06/2020   12 lead EKG today: a-fib, rate 84, no ischemic changes.   Compared to EKG 05/02/19, his a-fib is new.  IMPRESSION AND PLAN:  1) Persistent a-fib, rate controlled, asymptomatic. Multiple attempts at cardioversion failed, eventually underwent ablation and until now he's had no problems. CHA2DS2-VASc score  is 2. I'll restart his xarelto 20mg  qd but have recommended he return to his electrophysiologist, Dr. Rayann Heman ASAP to discuss any ongoing anticoag recommendations +/- options regarding cardioversion, etc. Continue lopressor 25mg  bid and cardizem cd 180 qd. CBC and lytes today.  2) DM: compliant with metformin. Hba1c today, lytes/cr today.  3) HLD: tolerating rosuva 20 started by Dr. Debara Pickett about 2 mo ago. Not fasting today.  Hepatic panel today.  Keep approp f/u with Dr. Debara Pickett.  4) HTN: stable.  Lytes/cr today. Continue lopressor, lisinopril, diltiazem.  5) R great toe onychomycosis: I recommended against oral antifungal. I offered penlac topical tx and he did want this--rx sent today.  6) Penile fracture: at the end of visit today he asked what to do about penis fracture, says he sustained it about a year ago, no pain at this time but moderate curvature per pt.  He is interested in seeing if urologist has anything to offer for tx-->referral ordered today.  Spent 45 min with pt today reviewing HPI, reviewing relevant past history, doing exam, reviewing and discussing lab and imaging data, and formulating plans.  An After Visit Summary was printed and given to the patient.  FOLLOW UP: Return in about 3 months (around 06/06/2020) for routine chronic illness f/u.  Signed:  Crissie Sickles, MD           03/06/2020

## 2020-03-07 ENCOUNTER — Other Ambulatory Visit: Payer: Self-pay

## 2020-03-07 MED ORDER — METFORMIN HCL 500 MG PO TABS
ORAL_TABLET | ORAL | 5 refills | Status: DC
Start: 2020-03-07 — End: 2020-10-21

## 2020-03-21 ENCOUNTER — Ambulatory Visit: Payer: BC Managed Care – PPO | Admitting: Internal Medicine

## 2020-04-04 ENCOUNTER — Other Ambulatory Visit: Payer: Self-pay | Admitting: Family Medicine

## 2020-04-05 ENCOUNTER — Encounter: Payer: Self-pay | Admitting: Family Medicine

## 2020-04-09 ENCOUNTER — Telehealth: Payer: BC Managed Care – PPO | Admitting: Internal Medicine

## 2020-04-22 ENCOUNTER — Encounter: Payer: Self-pay | Admitting: Internal Medicine

## 2020-04-22 ENCOUNTER — Encounter: Payer: Self-pay | Admitting: *Deleted

## 2020-04-22 ENCOUNTER — Other Ambulatory Visit: Payer: Self-pay

## 2020-04-22 ENCOUNTER — Ambulatory Visit: Payer: BC Managed Care – PPO | Admitting: Internal Medicine

## 2020-04-22 VITALS — BP 132/78 | HR 72 | Ht 74.0 in | Wt 226.3 lb

## 2020-04-22 DIAGNOSIS — I10 Essential (primary) hypertension: Secondary | ICD-10-CM

## 2020-04-22 DIAGNOSIS — I4892 Unspecified atrial flutter: Secondary | ICD-10-CM | POA: Diagnosis not present

## 2020-04-22 DIAGNOSIS — I4819 Other persistent atrial fibrillation: Secondary | ICD-10-CM

## 2020-04-22 DIAGNOSIS — D6869 Other thrombophilia: Secondary | ICD-10-CM | POA: Diagnosis not present

## 2020-04-22 MED ORDER — RIVAROXABAN 20 MG PO TABS: 20.0000 mg | ORAL_TABLET | Freq: Every day | ORAL | 3 refills | Status: DC

## 2020-04-22 NOTE — Patient Instructions (Addendum)
Medication Instructions:  Start Xarelto  *If you need a refill on your cardiac medications before your next appointment, please call your pharmacy*  Lab Work: None ordered.  If you have labs (blood work) drawn today and your tests are completely normal, you will receive your results only by:  [place] (if you have MyChart) OR  A paper copy in the mail If you have any lab test that is abnormal or we need to change your treatment, we will call you to review the results.  Testing/Procedures: Your physician has recommended that you have a Cardioversion (DCCV). Electrical Cardioversion uses a jolt of electricity to your heart either through paddles or wired patches attached to your chest. This is a controlled, usually prescheduled, procedure. Defibrillation is done under light anesthesia in the hospital, and you usually go home the day of the procedure. This is done to get your heart back into a normal rhythm. You are not awake for the procedure. Please see the instruction sheet given to you today.    Follow-Up: At [facility], you and your health needs are our priority.  As part of our continuing mission to provide you with exceptional heart care, we have created designated Provider Care Teams.  These Care Teams include your primary Cardiologist (physician) and Advanced Practice Providers (APPs -  Physician Assistants and Nurse Practitioners) who all work together to provide you with the care you need, when you need it.  We recommend signing up for the patient portal called "MyChart".  Sign up information is provided on this After Visit Summary.  MyChart is used to connect with patients for Virtual Visits (Telemedicine).  Patients are able to view lab/test results, encounter notes, upcoming appointments, etc.  Non-urgent messages can be sent to your provider as well.   To learn more about what you can do with MyChart, go to [email or web address].    Your next appointment:   Your  physician wants you to follow-up in: 5 weeks with the afib clinic. They will contact you.     Other Instructions:

## 2020-04-22 NOTE — Progress Notes (Signed)
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  Since last being seen in our clinic, the patient reports doing very well.  Today, he denies symptoms of palpitations, chest pain, shortness of breath,  lower extremity edema, dizziness, presyncope, or syncope.  The patient is otherwise without complaint today.   Past Medical History:  Diagnosis Date  . Anxiety attack   . Diabetes mellitus ([place]) 07/2019   prediab 2015.  DM 2 dx 07/2019  . Elevated transaminase level    suspect combo of hyperlip/fatty liver + alcohol.  Viral Hep panel neg 2016.  [place] Gout    multiple episodes (L MTP jt)  . History of gluten intolerance   . Hyperlipemia    Intolerant of statins: tolerates welchol  . Hypertension   . 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 Perennial allergic rhinitis   . Persistent atrial fibrillation ([place]) 2013; 2019   Dr. Percival Spanish.  Spont conv in ED.  Outpt eval showed nl ECHO--ASA qd and BB/CCB started.  Multiple cardioversions and recurrences, finally got ablation procedure.03/2019--no [place] b/c CHADVASC score low.   Past Surgical History:  Procedure Laterality Date  . ATRIAL FIBRILLATION ABLATION N/A 03/31/2019   Procedure: ATRIAL FIBRILLATION ABLATION;  Surgeon: Thompson Grayer, MD;  Location: [place] [facility];  Service: Cardiovascular;  Laterality: N/A;  . CARDIOVERSION N/A 06/11/2017   Procedure: CARDIOVERSION;  Surgeon: Skeet Latch, MD;  Location[facility] ENDOSCOPY;  Service: Cardiovascular;  Laterality: N/A;  . CARDIOVERSION N/A 01/23/2019   Procedure: CARDIOVERSION;  Surgeon: Buford Dresser, MD;  Location: [facility] ENDOSCOPY;  Service: Cardiovascular;  Laterality: N/A;  . DC cardioversion  06/11/2017   was back in A-fib within 5 min of waking up.  . TRANSTHORACIC ECHOCARDIOGRAM  04/2012   NORMAL (ED 55-65%)     ROS- all systems are reviewed and negatives except as per HPI above  Current Outpatient Medications  Medication Sig Dispense Refill  . cholecalciferol (VITAMIN D) 25 MCG (1000 UT) tablet Take 1,000 Units by mouth 2 (two) times daily.    . ciclopirox (PENLAC) 8 % solution Apply topically at bedtime. Apply over nail and surrounding skin. Apply daily over previous coat. After seven (7) days, may remove with alcohol and continue cycle. 6.6 mL 6  . clotrimazole-betamethasone (LOTRISONE) cream APPLY TO AFFECTED AREA TWICE A DAY 30 g 6  . diltiazem (CARDIZEM CD) 180 MG 24 hr capsule TAKE 1 CAPSULE BY MOUTH EVERY DAY 90 capsule 3  . escitalopram (LEXAPRO) 10 MG tablet TAKE 1 TABLET BY MOUTH EVERY DAY 30 tablet 5  . lisinopril (ZESTRIL) 40 MG tablet TAKE 1 TABLET BY MOUTH EVERYDAY AT BEDTIME 90 tablet 1  . metFORMIN (GLUCOPHAGE) 500 MG tablet Take 1 tab po qAM and 2 tabs po qPM. 90 tablet 5  . metoprolol tartrate (LOPRESSOR) 25 MG tablet Take 1 tablet (25 mg total) by mouth 2 (two) times daily. 180 tablet 3  . Omega-3 Fatty Acids (FISH OIL) 1200 MG CAPS Take 1,200 mg by mouth 2 (two) times daily.     No current facility-administered medications for this visit.    Physical Exam: Vitals:   04/22/20 1028  BP: 132/78  Pulse: 72  SpO2: 97%  Weight: 226 lb 4.8 oz (102.6 kg)  Height: 6\' 2"  (1.88 m)    GEN- The patient  is well appearing, alert and oriented x 3 today.   Head- normocephalic, atraumatic Eyes-  Sclera clear, conjunctiva pink Ears- hearing intact Oropharynx- clear Lungs- Clear to ausculation bilaterally, normal work of breathing Heart- Regular rate and rhythm, no murmurs, rubs or gallops, PMI not laterally displaced GI- soft, NT, ND, + BS Extremities- no clubbing, cyanosis, or edema  Wt Readings from Last 3 Encounters:  04/22/20 226 lb 4.8 oz (102.6 kg)  03/06/20 223 lb 12.8 oz (101.5 kg)  12/19/19 217 lb (98.4 kg)    EKG tracing ordered today is personally reviewed and  shows rate controlled afib  Assessment and Plan:  1. Persistent afib Well controlled post ablation off AAD therapy chads2vasc score is 1.  He does not require [place] chronically. We will resume xarelto 20mg  daily today.  Proceed with cardioversion in 4 weeks.  Follow-up in AF clinic 1 week after [facility].  I will see again in 2-3 months. Given paucity of symptoms, we may be best with rate control as a long term strategy.  I discussed AADs (tikosyn), repeat ablation, and surgical MAZE as options, though without symptoms, I am not sure that these would offer much benefit.  2. HTN Stable No change required today   Risks, benefits and potential toxicities for medications prescribed and/or refilled reviewed with patient today.   Thompson Grayer MD, [facility] 04/22/2020 11:27 AM

## 2020-04-26 ENCOUNTER — Other Ambulatory Visit: Payer: Self-pay | Admitting: Internal Medicine

## 2020-04-26 DIAGNOSIS — I4819 Other persistent atrial fibrillation: Secondary | ICD-10-CM

## 2020-04-30 ENCOUNTER — Ambulatory Visit (HOSPITAL_COMMUNITY): Payer: BC Managed Care – PPO | Admitting: Physician Assistant

## 2020-05-07 ENCOUNTER — Ambulatory Visit (HOSPITAL_COMMUNITY): Payer: BC Managed Care – PPO | Admitting: Physician Assistant

## 2020-05-07 DIAGNOSIS — N486 Induration penis plastica: Secondary | ICD-10-CM | POA: Diagnosis not present

## 2020-05-23 ENCOUNTER — Other Ambulatory Visit (HOSPITAL_COMMUNITY)
Admission: RE | Admit: 2020-05-23 | Discharge: 2020-05-23 | Disposition: A | Discharge status: Home / Self Care | Payer: BC Managed Care – PPO | Source: Ambulatory Visit | Attending: Cardiology | Admitting: Cardiology

## 2020-05-23 DIAGNOSIS — I4819 Other persistent atrial fibrillation: Secondary | ICD-10-CM | POA: Diagnosis not present

## 2020-05-23 DIAGNOSIS — Z7901 Long term (current) use of anticoagulants: Secondary | ICD-10-CM | POA: Diagnosis not present

## 2020-05-23 DIAGNOSIS — Z7984 Long term (current) use of oral hypoglycemic drugs: Secondary | ICD-10-CM | POA: Diagnosis not present

## 2020-05-23 DIAGNOSIS — I10 Essential (primary) hypertension: Secondary | ICD-10-CM | POA: Diagnosis not present

## 2020-05-23 DIAGNOSIS — Z01812 Encounter for preprocedural laboratory examination: Secondary | ICD-10-CM | POA: Insufficient documentation

## 2020-05-23 DIAGNOSIS — Z20822 Contact with and (suspected) exposure to covid-19: Secondary | ICD-10-CM | POA: Insufficient documentation

## 2020-05-23 DIAGNOSIS — Z79899 Other long term (current) drug therapy: Secondary | ICD-10-CM | POA: Diagnosis not present

## 2020-05-23 LAB — SARS CORONAVIRUS 2 (TAT 6-24 HRS): SARS Coronavirus 2: NEGATIVE

## 2020-05-24 ENCOUNTER — Encounter (HOSPITAL_COMMUNITY): Payer: Self-pay | Admitting: Cardiology

## 2020-05-24 ENCOUNTER — Other Ambulatory Visit: Payer: Self-pay

## 2020-05-24 ENCOUNTER — Ambulatory Visit (HOSPITAL_COMMUNITY)
Admission: RE | Admit: 2020-05-24 | Discharge: 2020-05-24 | Disposition: A | Discharge status: Home / Self Care | Payer: BC Managed Care – PPO | Attending: Cardiology | Admitting: Cardiology

## 2020-05-24 ENCOUNTER — Ambulatory Visit (HOSPITAL_COMMUNITY): Payer: BC Managed Care – PPO | Admitting: Anesthesiology

## 2020-05-24 ENCOUNTER — Encounter (HOSPITAL_COMMUNITY)
Admission: RE | Disposition: A | Discharge status: Home / Self Care | Payer: Self-pay | Source: Home / Self Care | Attending: Cardiology

## 2020-05-24 DIAGNOSIS — Z20822 Contact with and (suspected) exposure to covid-19: Secondary | ICD-10-CM | POA: Diagnosis not present

## 2020-05-24 DIAGNOSIS — I10 Essential (primary) hypertension: Secondary | ICD-10-CM | POA: Insufficient documentation

## 2020-05-24 DIAGNOSIS — I4819 Other persistent atrial fibrillation: Secondary | ICD-10-CM

## 2020-05-24 DIAGNOSIS — Z79899 Other long term (current) drug therapy: Secondary | ICD-10-CM | POA: Diagnosis not present

## 2020-05-24 DIAGNOSIS — Z7901 Long term (current) use of anticoagulants: Secondary | ICD-10-CM | POA: Diagnosis not present

## 2020-05-24 DIAGNOSIS — Z01812 Encounter for preprocedural laboratory examination: Secondary | ICD-10-CM | POA: Diagnosis not present

## 2020-05-24 DIAGNOSIS — Z7984 Long term (current) use of oral hypoglycemic drugs: Secondary | ICD-10-CM | POA: Diagnosis not present

## 2020-05-24 DIAGNOSIS — I4892 Unspecified atrial flutter: Secondary | ICD-10-CM | POA: Diagnosis not present

## 2020-05-24 DIAGNOSIS — E785 Hyperlipidemia, unspecified: Secondary | ICD-10-CM | POA: Diagnosis not present

## 2020-05-24 HISTORY — PX: CARDIOVERSION: SHX1299

## 2020-05-24 HISTORY — DX: Induration penis plastica: N48.6

## 2020-05-24 LAB — POCT I-STAT, CHEM 8
BUN: 18 mg/dL (ref 6–20)
Calcium, Ion: 1.16 mmol/L (ref 1.15–1.40)
Chloride: 102 mmol/L (ref 98–111)
Creatinine, Ser: 0.8 mg/dL (ref 0.61–1.24)
Glucose, Bld: 174 mg/dL — ABNORMAL HIGH (ref 70–99)
HCT: 49 % (ref 39.0–52.0)
Hemoglobin: 16.7 g/dL (ref 13.0–17.0)
Potassium: 4.5 mmol/L (ref 3.5–5.1)
Sodium: 138 mmol/L (ref 135–145)
TCO2: 25 mmol/L (ref 22–32)

## 2020-05-24 SURGERY — CARDIOVERSION
Anesthesia: General

## 2020-05-24 MED ORDER — PROPOFOL 10 MG/ML IV BOLUS
INTRAVENOUS | Status: DC | PRN
  Administered 2020-05-24: 50 mg via INTRAVENOUS
  Administered 2020-05-24: 100 mg via INTRAVENOUS

## 2020-05-24 MED ORDER — SODIUM CHLORIDE 0.9 % IV SOLN: INTRAVENOUS | Status: DC

## 2020-05-24 MED ORDER — LIDOCAINE HCL (CARDIAC) PF 100 MG/5ML IV SOSY
PREFILLED_SYRINGE | INTRAVENOUS | Status: DC | PRN
  Administered 2020-05-24: 100 mg via INTRAVENOUS

## 2020-05-24 NOTE — Anesthesia Postprocedure Evaluation (Signed)
Anesthesia Post Note  Patient: Riley Moore  Procedure(s) Performed: CARDIOVERSION (N/A )     Patient location during evaluation: Endoscopy Anesthesia Type: General Level of consciousness: sedated Pain management: pain level controlled Vital Signs Assessment: post-procedure vital signs reviewed and stable Respiratory status: spontaneous breathing and respiratory function stable Cardiovascular status: stable Postop Assessment: no apparent nausea or vomiting Anesthetic complications: no   No complications documented.  Last Vitals:  Vitals:   05/24/20 1050 05/24/20 1051  BP:    Pulse: (!) 59 63  Resp: 14 14  Temp:    SpO2: 99% 98%    Last Pain:  Vitals:   05/24/20 1040  TempSrc: Oral  PainSc: 0-No pain                 [name]

## 2020-05-24 NOTE — Transfer of Care (Signed)
Immediate Anesthesia Transfer of Care Note  Patient: Riley Moore  Procedure(s) Performed: CARDIOVERSION (N/A )  Patient Location: Endoscopy Unit  Anesthesia Type:General  Level of Consciousness: drowsy  Airway & Oxygen Therapy: Patient Spontanous Breathing  Post-op Assessment: Report given to RN and Post -op Vital signs reviewed and stable  Post vital signs: Reviewed and stable  Last Vitals:  Vitals Value Taken Time  BP 125/81   Temp    Pulse 65   Resp 14   SpO2 96     Last Pain:  Vitals:   05/24/20 0921  TempSrc: Oral  PainSc: 0-No pain         Complications: No complications documented.

## 2020-05-24 NOTE — H&P (Signed)
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  Since last being seen in our clinic, the patient reports doing very well.  Today, he denies symptoms of palpitations, chest pain, shortness of breath,  lower extremity edema, dizziness, presyncope, or syncope.  The patient is otherwise without complaint today.       Past Medical History:  Diagnosis Date   Anxiety attack    Diabetes mellitus ([place]) 07/2019   prediab 2015.  DM 2 dx 07/2019   Elevated transaminase level    suspect combo of hyperlip/fatty liver + alcohol.  Viral Hep panel neg 2016.   Gout    multiple episodes (L MTP jt)   History of gluten intolerance    Hyperlipemia    Intolerant of statins: tolerates welchol   Hypertension    Left foot pain 2020   cavus foot type resulting in midfoot capsulitis; posterior heel spur; arthritis first MPJ   Perennial allergic rhinitis    Persistent atrial fibrillation ([place]) 2013; 2019   Dr. Percival Spanish.  Spont conv in ED.  Outpt eval showed nl ECHO--ASA qd and BB/CCB started.  Multiple cardioversions and recurrences, finally got ablation procedure.03/2019--no [place] b/c CHADVASC score low.        Past Surgical History:  Procedure Laterality Date   ATRIAL FIBRILLATION ABLATION N/A 03/31/2019   Procedure: ATRIAL FIBRILLATION ABLATION;  Surgeon: Thompson Grayer, MD;  Location: [place] [facility];  Service: Cardiovascular;  Laterality: N/A;   CARDIOVERSION N/A 06/11/2017   Procedure: CARDIOVERSION;  Surgeon: Skeet Latch, MD;  Location: [place];  Service: Cardiovascular;  Laterality: N/A;   CARDIOVERSION N/A 01/23/2019   Procedure: CARDIOVERSION;  Surgeon: Buford Dresser, MD;  Location: [facility] ENDOSCOPY;  Service: Cardiovascular;  Laterality: N/A;   DC cardioversion  06/11/2017   was back in A-fib within 5 min of waking up.   TRANSTHORACIC ECHOCARDIOGRAM   04/2012   NORMAL (ED 55-65%)    ROS- all systems are reviewed and negatives except as per HPI above        Current Outpatient Medications  Medication Sig Dispense Refill   cholecalciferol (VITAMIN D) 25 MCG (1000 UT) tablet Take 1,000 Units by mouth 2 (two) times daily.     ciclopirox (PENLAC) 8 % solution Apply topically at bedtime. Apply over nail and surrounding skin. Apply daily over previous coat. After seven (7) days, may remove with alcohol and continue cycle. 6.6 mL 6   clotrimazole-betamethasone (LOTRISONE) cream APPLY TO AFFECTED AREA TWICE A DAY 30 g 6   diltiazem (CARDIZEM CD) 180 MG 24 hr capsule TAKE 1 CAPSULE BY MOUTH EVERY DAY 90 capsule 3   escitalopram (LEXAPRO) 10 MG tablet TAKE 1 TABLET BY MOUTH EVERY DAY 30 tablet 5   lisinopril (ZESTRIL) 40 MG tablet TAKE 1 TABLET BY MOUTH EVERYDAY AT BEDTIME 90 tablet 1   metFORMIN (GLUCOPHAGE) 500 MG tablet Take 1 tab po qAM and 2 tabs po qPM. 90 tablet 5   metoprolol tartrate (LOPRESSOR) 25 MG tablet Take 1 tablet (25 mg total) by mouth 2 (two) times daily. 180 tablet 3   Omega-3 Fatty Acids (FISH OIL) 1200 MG CAPS Take 1,200 mg by mouth 2 (two) times daily.     No current facility-administered medications for this visit.    Physical Exam:    Vitals:   04/22/20 1028  BP: 132/78  Pulse: 72  SpO2: 97%  Weight: 226 lb 4.8 oz (102.6 kg)  Height: 6\' 2"  (1.88 m)    GEN- The patient is well appearing, alert and oriented x 3 today.   Head- normocephalic, atraumatic Eyes-  Sclera clear, conjunctiva pink Ears- hearing intact Oropharynx- clear Lungs- Clear to ausculation bilaterally, normal work of breathing Heart- Regular rate and rhythm, no murmurs, rubs or gallops, PMI not laterally displaced GI- soft, NT, ND, + BS Extremities- no clubbing, cyanosis, or edema     Wt Readings from Last 3 Encounters:  04/22/20 226 lb 4.8 oz (102.6 kg)  03/06/20 223 lb 12.8 oz (101.5 kg)  12/19/19 217 lb (98.4 kg)     EKG tracing ordered today is personally reviewed and shows rate controlled afib  Assessment and Plan:  1. Persistent afib Well controlled post ablation off AAD therapy chads2vasc score is 1.  He does not require [place] chronically. We will resume xarelto 20mg  daily today.  Proceed with cardioversion in 4 weeks.  Follow-up in AF clinic 1 week after [facility].  I will see again in 2-3 months. Given paucity of symptoms, we may be best with rate control as a long term strategy.  I discussed AADs (tikosyn), repeat ablation, and surgical MAZE as options, though without symptoms, I am not sure that these would offer much benefit.  2. HTN Stable No change required today   Risks, benefits and potential toxicities for medications prescribed and/or refilled reviewed with patient today.   Thompson Grayer MD, [place] seen and examined. Agree with above. Here for cardioversion.  [name], MD

## 2020-05-24 NOTE — CV Procedure (Signed)
    Electrical Cardioversion Procedure Note Riley Moore [id number] [id number]  Procedure: Electrical Cardioversion Indications:  Atrial Fibrillation  Time Out: Verified patient identification, verified procedure,medications/allergies/relevent history reviewed, required imaging and test results available.  Performed  Procedure Details  The patient was NPO after midnight. Anesthesia was administered at the beside  by Dr. Tobias Alexander with 150mg  of propofol.  Cardioversion was performed with synchronized biphasic defibrillation via AP pads with 200 joules.  2 attempt(s) were performed (second with compression).  The patient converted to normal sinus rhythm. The patient tolerated the procedure well   IMPRESSION:  Successful cardioversion of atrial fibrillation   Riley Moore 05/24/2020, 10:35 AM

## 2020-05-24 NOTE — Discharge Instructions (Signed)
Electrical Cardioversion Electrical cardioversion is the delivery of a jolt of electricity to restore a normal rhythm to the heart. A rhythm that is too fast or is not regular keeps the heart from pumping well. In this procedure, sticky patches or metal paddles are placed on the chest to deliver electricity to the heart from a device. This procedure may be done in an emergency if:  There is low or no blood pressure as a result of the heart rhythm.  Normal rhythm must be restored as fast as possible to protect the brain and heart from further damage.  It may save a life. This may also be a scheduled procedure for irregular or fast heart rhythms that are not immediately life-threatening. Tell a health care provider about:  Any allergies you have.  All medicines you are taking, including vitamins, herbs, eye drops, creams, and over-the-counter medicines.  Any problems you or family members have had with anesthetic medicines.  Any blood disorders you have.  Any surgeries you have had.  Any medical conditions you have.  Whether you are pregnant or may be pregnant. What are the risks? Generally, this is a safe procedure. However, problems may occur, including:  Allergic reactions to medicines.  A blood clot that breaks free and travels to other parts of your body.  The possible return of an abnormal heart rhythm within hours or days after the procedure.  Your heart stopping (cardiac arrest). This is rare. What happens before the procedure? Medicines  Your health care provider may have you start taking: ? Blood-thinning medicines (anticoagulants) so your blood does not clot as easily. ? Medicines to help stabilize your heart rate and rhythm.  Ask your health care provider about: ? Changing or stopping your regular medicines. This is especially important if you are taking diabetes medicines or blood thinners. ? Taking medicines such as aspirin and ibuprofen. These medicines can  thin your blood. Do not take these medicines unless your health care provider tells you to take them. ? Taking over-the-counter medicines, vitamins, herbs, and supplements. General instructions  Follow instructions from your health care provider about eating or drinking restrictions.  Plan to have someone take you home from the hospital or clinic.  If you will be going home right after the procedure, plan to have someone with you for 24 hours.  Ask your health care provider what steps will be taken to help prevent infection. These may include washing your skin with a germ-killing soap. What happens during the procedure?  An IV will be inserted into one of your veins.  Sticky patches (electrodes) or metal paddles may be placed on your chest.  You will be given a medicine to help you relax (sedative).  An electrical shock will be delivered. The procedure may vary among health care providers and hospitals.   What can I expect after the procedure?  Your blood pressure, heart rate, breathing rate, and blood oxygen level will be monitored until you leave the hospital or clinic.  Your heart rhythm will be watched to make sure it does not change.  You may have some redness on the skin where the shocks were given. Follow these instructions at home:  Do not drive for 24 hours if you were given a sedative during your procedure.  Take over-the-counter and prescription medicines only as told by your health care provider.  Ask your health care provider how to check your pulse. Check it often.  Rest for 48 hours after the procedure   or as told by your health care provider.  Avoid or limit your caffeine use as told by your health care provider.  Keep all follow-up visits as told by your health care provider. This is important. Contact a health care provider if:  You feel like your heart is beating too quickly or your pulse is not regular.  You have a serious muscle cramp that does not go  away. Get help right away if:  You have discomfort in your chest.  You are dizzy or you feel faint.  You have trouble breathing or you are short of breath.  Your speech is slurred.  You have trouble moving an arm or leg on one side of your body.  Your fingers or toes turn cold or blue. Summary  Electrical cardioversion is the delivery of a jolt of electricity to restore a normal rhythm to the heart.  This procedure may be done right away in an emergency or may be a scheduled procedure if the condition is not an emergency.  Generally, this is a safe procedure.  After the procedure, check your pulse often as told by your health care provider. This information is not intended to replace advice given to you by your health care provider. Make sure you discuss any questions you have with your health care provider. Document Revised: 11/21/2018 Document Reviewed: 11/21/2018 Elsevier Patient Education  2021 Elsevier Inc.  

## 2020-05-24 NOTE — Anesthesia Preprocedure Evaluation (Addendum)
Anesthesia Evaluation  Patient identified by MRN, date of birth, ID band Patient awake    Reviewed: Allergy & Precautions, NPO status , Patient's Chart, lab work & pertinent test results  Airway Mallampati: I  TM Distance: >3 FB Neck ROM: Full    Dental no notable dental hx. (+) Teeth Intact, Dental Advisory Given   Pulmonary neg pulmonary ROS,    Pulmonary exam normal breath sounds clear to auscultation       Cardiovascular hypertension, Pt. on medications Normal cardiovascular exam+ dysrhythmias Atrial Fibrillation  Rhythm:Irregular Rate:Abnormal  IMPRESSIONS    1. Left ventricular ejection fraction, by visual estimation, is 60 to  65%. The left ventricle has normal function. Normal left ventricular size.  There is mildly increased left ventricular hypertrophy.  2. Left ventricular diastolic Doppler parameters are indeterminate  pattern of LV diastolic filling.  3. Global right ventricle has normal systolic function.The right  ventricular size is normal. No increase in right ventricular wall  thickness.  4. Left atrial size was moderately dilated.  5. Right atrial size was moderately dilated.  6. The mitral valve is normal in structure. Mild mitral valve  regurgitation. No evidence of mitral stenosis.  7. The tricuspid valve is normal in structure. Tricuspid valve  regurgitation was not visualized by color flow Doppler.  8. The aortic valve is normal in structure. Aortic valve regurgitation  was not visualized by color flow Doppler. Structurally normal aortic  valve, with no evidence of sclerosis or stenosis.  9. The pulmonic valve was normal in structure. Pulmonic valve  regurgitation is not visualized by color flow Doppler.  10. The inferior vena cava is normal in size with greater than 50%  respiratory variability, suggesting right atrial pressure of 3 mmHg.    Neuro/Psych negative neurological ROS      GI/Hepatic negative GI ROS, Neg liver ROS,   Endo/Other  negative endocrine ROSdiabetes  Renal/GU negative Renal ROS     Musculoskeletal negative musculoskeletal ROS (+)   Abdominal   Peds  Hematology negative hematology ROS (+)   Anesthesia Other Findings   Reproductive/Obstetrics                             Anesthesia Physical  Anesthesia Plan  ASA: II  Anesthesia Plan: General   Post-op Pain Management:    Induction: Intravenous  PONV Risk Score and Plan: Treatment may vary due to age or medical condition  Airway Management Planned: Natural Airway  Additional Equipment:   Intra-op Plan:   Post-operative Plan:   Informed Consent: I have reviewed the patients History and Physical, chart, labs and discussed the procedure including the risks, benefits and alternatives for the proposed anesthesia with the patient or authorized representative who has indicated his/her understanding and acceptance.     Dental advisory given  Plan Discussed with: Anesthesiologist and CRNA  Anesthesia Plan Comments: (100/160)       Anesthesia Quick Evaluation

## 2020-05-27 ENCOUNTER — Encounter (HOSPITAL_COMMUNITY): Payer: Self-pay | Admitting: Cardiology

## 2020-05-29 ENCOUNTER — Ambulatory Visit: Payer: BC Managed Care – PPO | Admitting: Internal Medicine

## 2020-05-31 ENCOUNTER — Ambulatory Visit (HOSPITAL_COMMUNITY): Payer: BC Managed Care – PPO | Admitting: Physician Assistant

## 2020-06-03 ENCOUNTER — Encounter (HOSPITAL_COMMUNITY): Payer: Self-pay | Admitting: Nurse Practitioner

## 2020-06-03 ENCOUNTER — Other Ambulatory Visit: Payer: Self-pay

## 2020-06-03 ENCOUNTER — Ambulatory Visit (HOSPITAL_COMMUNITY)
Admission: RE | Admit: 2020-06-03 | Discharge: 2020-06-03 | Disposition: A | Discharge status: Home / Self Care | Payer: BC Managed Care – PPO | Source: Ambulatory Visit | Attending: Nurse Practitioner | Admitting: Nurse Practitioner

## 2020-06-03 VITALS — BP 120/76 | HR 62 | Ht 74.0 in | Wt 223.0 lb

## 2020-06-03 DIAGNOSIS — Z7901 Long term (current) use of anticoagulants: Secondary | ICD-10-CM | POA: Insufficient documentation

## 2020-06-03 DIAGNOSIS — I10 Essential (primary) hypertension: Secondary | ICD-10-CM | POA: Insufficient documentation

## 2020-06-03 DIAGNOSIS — I4892 Unspecified atrial flutter: Secondary | ICD-10-CM | POA: Diagnosis not present

## 2020-06-03 DIAGNOSIS — I4891 Unspecified atrial fibrillation: Secondary | ICD-10-CM | POA: Diagnosis not present

## 2020-06-03 DIAGNOSIS — I4819 Other persistent atrial fibrillation: Secondary | ICD-10-CM | POA: Diagnosis not present

## 2020-06-03 NOTE — Progress Notes (Signed)
Primary Care Physician: Tammi Sou, MD Referring Physician: [facility] ER f/u   Riley Moore is a 55 y.o. male with a h/o afib first onset in 2013. He did well for a number of years, staying in [place], but recently found to be in [place] in April 29, 2017, when he went to the ER for chest pain. His chest pain resolved but he continued to be in afib. It was rate controlled. He was unaware. He did not drink large amounts of caffeine but stated that he drank  a six pack a night and may increase to 8 on the weekends for anxiety. He reported  that he would have panic attacks if he cut  down on his beer intake. He has been on ASA daily, diltiazem and metoprolol for years. Chadsvasc score is 1 for HTN.  When I initially saw him, I was concerned to start anticoagulation with his alcoholism and wanted him to see his PCP for means of treating his anxiety, other than with alcohol. He was pending a f/u with Dr. Percival Spanish within the month.   He was started on anticoagulation and cardioverted, however he had ERAF before leaving the hospital. He was told to come back for an antiarrythmic when he was able to stop alcohol. He just did f/u with Dr. Percival Spanish again 11/24/18. He has stopped alcohol a while back and wanted to try to get back in rhythm. The plan was for pt to start anticoagulation x 3 weeks and then start flecainide 50 mg bid. I was to see pt a few days after starting flecainide. the pt has not started xarelto yet but does plan to start 8/10. Hence,  I am seeing pt prematurely in the midst of this plan.  He states that he is still  asymptomatic with afib and is rate controlled. If he fails AAD/DCCV, he wants to pursue ablation.  F/u in afib clinic, 9/3. He is now on flecainide 50 mg bid. He remains in atrial flutter at 83 bpm. He is tolerating flecainide, feels no different on drug. No missed doses of xarelto since starting.   He was increased to flecainide 100 mg bid and cardioverted, 9/21.. This was  successful but he is now back in [place], unsure of onset. He is not aware of afib/flutter.   F/u in afib clinic, 06/03/20. I have not see pt since his ablation in November of 2020. I think he had done well staying in [place], until December when he was seen by Dr. Rayann Heman and found to be in afib.  He was unaware and was not sure when he went back into afib. He set him up  for cardioversion, 05/24/20 which was successful. Dr. Rayann Heman felt if the cardioversion was not successful, due to paucity of symptoms,  rate control would be his management strategy going forward. EKG today shows NSR in the 60's.    Today, he denies symptoms of palpitations, chest pain, shortness of breath, orthopnea, PND, lower extremity edema, dizziness, presyncope, syncope, or neurologic sequela. The patient is tolerating medications without difficulties and is otherwise without complaint today.   Past Medical History:  Diagnosis Date  . Anxiety attack   . Diabetes mellitus ([place]) 07/2019   prediab 2015.  DM 2 dx 07/2019  . Elevated transaminase level    suspect combo of hyperlip/fatty liver + alcohol.  Viral Hep panel neg 2016.  Marland Kitchen Gout    multiple episodes (L MTP jt)  . History of gluten intolerance   .  Hyperlipemia    Intolerant of statins: tolerates welchol  . Hypertension   . 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 Perennial allergic rhinitis   . Persistent atrial fibrillation ([place]) 2013; 2019   Dr. Percival Spanish.  Spont conv in ED.  Outpt eval showed nl ECHO--ASA qd and BB/CCB started.  Multiple cardioversions and recurrences, finally got ablation procedure.03/2019--no [place] b/c CHADVASC score low.  . Peyronie's disease    Past Surgical History:  Procedure Laterality Date  . ATRIAL FIBRILLATION ABLATION N/A 03/31/2019   Procedure: ATRIAL FIBRILLATION ABLATION;  Surgeon: Thompson Grayer, MD;  Location: [place] CV LAB;  Service: Cardiovascular;   Laterality: N/A;  . CARDIOVERSION N/A 06/11/2017   Procedure: CARDIOVERSION;  Surgeon: Skeet Latch, MD;  [facility] ENDOSCOPY;  Service: Cardiovascular;  Laterality: N/A;  . CARDIOVERSION N/A 01/23/2019   Procedure: CARDIOVERSION;  Surgeon: Buford Dresser, MD;  Location: [place];  Service: Cardiovascular;  Laterality: N/A;  . CARDIOVERSION N/A 05/24/2020   Procedure: CARDIOVERSION;  Surgeon: Jerline Pain, MD;  Location: [facility] ENDOSCOPY;  Service: Cardiovascular;  Laterality: N/A;  . DC cardioversion  06/11/2017   was back in A-fib within 5 min of waking up.  . TRANSTHORACIC ECHOCARDIOGRAM  04/2012   NORMAL (ED 55-65%)    Current Outpatient Medications  Medication Sig Dispense Refill  . cetirizine (ZYRTEC) 10 MG tablet Take 10 mg by mouth daily.    . ciclopirox (PENLAC) 8 % solution Apply topically at bedtime. Apply over nail and surrounding skin. Apply daily over previous coat. After seven (7) days, may remove with alcohol and continue cycle. (Patient not taking: Reported on 05/17/2020) 6.6 mL 6  . clotrimazole-betamethasone (LOTRISONE) cream APPLY TO AFFECTED AREA TWICE A DAY (Patient taking differently: Apply 1 application topically 2 (two) times daily. APPLY TO AFFECTED AREA TWICE A DAY) 30 g 6  . diltiazem (CARDIZEM CD) 180 MG 24 hr capsule TAKE 1 CAPSULE BY MOUTH EVERY DAY (Patient taking differently: Take 180 mg by mouth daily. TAKE 1 CAPSULE BY MOUTH EVERY DAY) 90 capsule 3  . escitalopram (LEXAPRO) 10 MG tablet TAKE 1 TABLET BY MOUTH EVERY DAY (Patient taking differently: Take 10 mg by mouth daily.) 30 tablet 5  . lisinopril (ZESTRIL) 40 MG tablet TAKE 1 TABLET BY MOUTH EVERYDAY AT BEDTIME (Patient taking differently: Take 40 mg by mouth at bedtime.) 90 tablet 1  . metFORMIN (GLUCOPHAGE) 500 MG tablet Take 1 tab po qAM and 2 tabs po qPM. (Patient taking differently: Take 500-1,000 mg by mouth See admin instructions. 500 mg in  the morning, 1000 mg in the evening) 90 tablet  5  . metoprolol tartrate (LOPRESSOR) 25 MG tablet Take 1 tablet (25 mg total) by mouth 2 (two) times daily. 180 tablet 3  . rivaroxaban (XARELTO) 20 MG TABS tablet Take 1 tablet (20 mg total) by mouth daily with supper. 90 tablet 3   No current facility-administered medications for this encounter.    Allergies  Allergen Reactions  . Pravastatin     Decreased energy, "wiped out"    Social History   Socioeconomic History  . Marital status: Married    Spouse name: Not on file  . Number of children: 2  . Years of education: Not on file  . Highest education level: Not on file  Occupational History  . Occupation: Chief Financial Officer  Tobacco Use  . Smoking status: Never Smoker  . Smokeless tobacco: Never  Used  Vaping Use  . Vaping Use: Never used  Substance and Sexual Activity  . Alcohol use: Not Currently    Alcohol/week: 4.0 - 5.0 standard drinks    Types: 4 - 5 Cans of beer per week    Comment: daily  . Drug use: No  . Sexual activity: Not on file  Other Topics Concern  . Not on file  Social History Narrative   Lives at home with wife and two children.     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  Alc: 4 beers per day.  Denies hx of alc problems/abuse.  However, he says he tried quitting in the past and after a few days he started to have "panic attacks".         Social Determinants of Health   Financial Resource Strain: Not on file  Food Insecurity: Not on file  Transportation Needs: Not on file  Physical Activity: Not on file  Stress: Not on file  Social Connections: Not on file  Intimate Partner Violence: Not on file    Family History  Problem Relation Age of Onset  . Heart disease Father        pacemaker, atrial fibrillation  . Hypertension Father     ROS- All systems are reviewed and negative except as per the HPI above  Physical Exam: Vitals:   06/03/20 0843  Height: 6\' 2"  (1.88 m)   Wt Readings  from Last 3 Encounters:  05/24/20 99.8 kg  04/22/20 102.6 kg  03/06/20 101.5 kg    Labs: Lab Results  Component Value Date   NA 138 05/24/2020   K 4.5 05/24/2020   CL 102 05/24/2020   CO2 26 03/06/2020   GLUCOSE 174 (H) 05/24/2020   BUN 18 05/24/2020   CREATININE 0.80 05/24/2020   CALCIUM 8.9 03/06/2020   MG 1.9 03/06/2020   No results found for: INR Lab Results  Component Value Date   CHOL 226 (H) 10/19/2019   HDL 33.40 (L) 10/19/2019   LDLCALC 133 (H) 04/26/2017   TRIG 276.0 (H) 10/19/2019     GEN- The patient is well appearing, alert and oriented x 3 today.   Head- normocephalic, atraumatic Eyes-  Sclera clear, conjunctiva pink Ears- hearing intact Oropharynx- clear Neck- supple, no JVP Lymph- no cervical lymphadenopathy Lungs- Clear to ausculation bilaterally, normal work of breathing Heart-regular rate and rhythm, no murmurs, rubs or gallops, PMI not laterally displaced GI- soft, NT, ND, + BS Extremities- no clubbing, cyanosis, or edema MS- no significant deformity or atrophy Skin- no rash or lesion Psych- euthymic mood, full affect Neuro- strength and sensation are intact  EKG-  NSR at 62 bpm , pr int 170 ms, qrs int 76 ms, qtc 383 ms    Assessment and Plan: 1. Persistent  asymptomatic afib/flutter  Successful cardioversion  05/24/20 after being in afib for ? duration  Continue diltiazem 180 mg daily Discussed use of [place] as he is asymptomatic  in afib   2. Chadsvasc score of 1 Continue on xarelto 20 mg daily x 4 weeks after cardioversion and then can discontinue per guidelines   2. HTN  Stable   F/u with Dr. Rayann Heman 07/01/20 and Dr. Debara Pickett  08/02/20  Geroge Baseman. Martesha Niedermeier, [facility] [address] [place], [place] [address] [phone]

## 2020-07-01 ENCOUNTER — Ambulatory Visit: Payer: BC Managed Care – PPO | Admitting: Internal Medicine

## 2020-07-01 ENCOUNTER — Encounter: Payer: Self-pay | Admitting: Internal Medicine

## 2020-07-01 ENCOUNTER — Other Ambulatory Visit: Payer: Self-pay

## 2020-07-01 VITALS — BP 114/74 | HR 78 | Ht 74.0 in | Wt 222.6 lb

## 2020-07-01 DIAGNOSIS — I4819 Other persistent atrial fibrillation: Secondary | ICD-10-CM

## 2020-07-01 DIAGNOSIS — I10 Essential (primary) hypertension: Secondary | ICD-10-CM

## 2020-07-01 NOTE — Progress Notes (Signed)
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  Since last being seen in our clinic, the patient reports doing reasonably well. He is back in afib.  + some jitteriness/ fatigue. Today, he denies symptoms of palpitations, chest pain, shortness of breath,  lower extremity edema, dizziness, presyncope, or syncope.  The patient is otherwise without complaint today.   Past Medical History:  Diagnosis Date  . Anxiety attack   . Diabetes mellitus ([place]) 07/2019   prediab 2015.  DM 2 dx 07/2019  . Elevated transaminase level    suspect combo of hyperlip/fatty liver + alcohol.  Viral Hep panel neg 2016.  Marland Kitchen Gout    multiple episodes (L MTP jt)  . History of gluten intolerance   . Hyperlipemia    Intolerant of statins: tolerates welchol  . Hypertension   . 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 Perennial allergic rhinitis   . Persistent atrial fibrillation ([place]) 2013; 2019   Dr. Percival Spanish.  Spont conv in ED.  Outpt eval showed nl ECHO--ASA qd and BB/CCB started.  Multiple cardioversions and recurrences, finally got ablation procedure.03/2019--no [place] b/c CHADVASC score low.  . Peyronie's disease    Past Surgical History:  Procedure Laterality Date  . ATRIAL FIBRILLATION ABLATION N/A 03/31/2019   Procedure: ATRIAL FIBRILLATION ABLATION;  Surgeon: Thompson Grayer, MD;  Location: [facility] CV LAB;  Service: Cardiovascular;  Laterality: N/A;  . CARDIOVERSION N/A 06/11/2017   Procedure: CARDIOVERSION;  Surgeon: Skeet Latch, MD;  Location: [facility] ENDOSCOPY;  Service: Cardiovascular;  Laterality: N/A;  . CARDIOVERSION N/A 01/23/2019   Procedure: CARDIOVERSION;  Surgeon: Buford Dresser, MD;  Location: [facility];  Service: Cardiovascular;  Laterality: N/A;  . CARDIOVERSION N/A 05/24/2020   Procedure: CARDIOVERSION;  Surgeon:  Jerline Pain, MD;  Location: [facility] ENDOSCOPY;  Service: Cardiovascular;  Laterality: N/A;  . DC cardioversion  06/11/2017   was back in A-fib within 5 min of waking up.  . TRANSTHORACIC ECHOCARDIOGRAM  04/2012   NORMAL (ED 55-65%)    ROS- all systems are reviewed and negatives except as per HPI above  Current Outpatient Medications  Medication Sig Dispense Refill  . cetirizine (ZYRTEC) 10 MG tablet Take 10 mg by mouth daily.    . ciclopirox (PENLAC) 8 % solution Apply topically at bedtime. Apply over nail and surrounding skin. Apply daily over previous coat. After seven (7) days, may remove with alcohol and continue cycle. 6.6 mL 6  . clotrimazole-betamethasone (LOTRISONE) cream APPLY TO AFFECTED AREA TWICE A DAY 30 g 6  . diltiazem (CARDIZEM CD) 180 MG 24 hr capsule TAKE 1 CAPSULE BY MOUTH EVERY DAY 90 capsule 3  . escitalopram (LEXAPRO) 10 MG tablet TAKE 1 TABLET BY MOUTH EVERY DAY 30 tablet 5  . lisinopril (ZESTRIL) 40 MG tablet TAKE 1 TABLET BY MOUTH EVERYDAY AT BEDTIME 90 tablet 1  . metFORMIN (GLUCOPHAGE) 500 MG tablet Take 1 tab po qAM and 2 tabs po qPM. 90 tablet 5  . metoprolol tartrate (LOPRESSOR) 25 MG tablet Take 1 tablet (25 mg total) by mouth 2 (two) times daily. 180 tablet 3   No current facility-administered medications for this visit.    Physical Exam: Vitals:   07/01/20 0923  BP: 114/74  Pulse: 78  SpO2: 97%  Weight: 222 lb 9.6 oz (101 kg)  Height: 6\' 2"  (1.88 m)    GEN- The patient is well appearing, alert and oriented x 3 today.   Head- normocephalic, atraumatic Eyes-  Sclera clear, conjunctiva pink Ears- hearing intact Oropharynx- clear Lungs- Clear to ausculation bilaterally, normal work of breathing Heart- Regular rate and rhythm, no murmurs, rubs or gallops, PMI not laterally displaced GI- soft, NT, ND, + BS Extremities- no clubbing, cyanosis, or edema  Wt Readings from Last 3 Encounters:  07/01/20 222 lb 9.6 oz (101 kg)  06/03/20 223 lb (101.2 kg)   05/24/20 220 lb (99.8 kg)    EKG tracing ordered today is personally reviewed and shows rate controlled afib  Assessment and Plan:  1. Persistent afib/ atrial flutter He has done very well post ablation off AAD therapy  He did have afib 12/21 for which he required [place].  He is back in afib today. chads2vascsc score is 1.   Therapeutic strategies for afib including medicine (retrial of flecainide, tikosyn) and ablation were discussed in detail with the patient today. Risk, benefits, and alternatives to EP study and radiofrequency ablation for afib were also discussed in detail today. These risks include but are not limited to stroke, bleeding, vascular damage, tamponade, perforation, damage to the esophagus, lungs, and other structures, pulmonary vein stenosis, worsening renal function, and death. The patient understands these risk and wishes to proceed.  We will therefore proceed with catheter ablation at the next available time.  Carto, ICE, anesthesia are requested for the procedure.  Will also obtain cardiac CT prior to the procedure to exclude LAA thrombus and further evaluate atrial anatomy. We will also obtain echo post ablation  2. HTN Stable No change required today   Risks, benefits and potential toxicities for medications prescribed and/or refilled reviewed with patient today.   Thompson Grayer MD, [facility] 07/01/2020 9:42 AM

## 2020-07-01 NOTE — Patient Instructions (Signed)
Medication Instructions:  Start Xarelto 20 mg daily  Your physician recommends that you continue on your current medications as directed. Please refer to the Current Medication list given to you today.  Labwork: None ordered.  Testing/Procedures: Your physician has recommended that you have an ablation. Catheter ablation is a medical procedure used to treat some cardiac arrhythmias (irregular heartbeats). During catheter ablation, a long, thin, flexible tube is put into a blood vessel in your groin (upper thigh), or neck. This tube is called an ablation catheter. It is then guided to your heart through the blood vessel. Radio frequency waves destroy small areas of heart tissue where abnormal heartbeats may cause an arrhythmia to start. Please see the instruction sheet given to you today.   Follow-Up: Your physician wants you to follow-up in: call with ablation date choice. March 24 or March 31.   Any Other Special Instructions Will Be Listed Below (If Applicable).  If you need a refill on your cardiac medications before your next appointment, please call your pharmacy.    Cardiac Ablation Cardiac ablation is a procedure to destroy (ablate) some heart tissue that is sending bad signals. These bad signals cause problems in heart rhythm. The heart has many areas that make these signals. If there are problems in these areas, they can make the heart beat in a way that is not normal. Destroying some tissues can help make the heart rhythm normal. Tell your doctor about:  Any allergies you have.  All medicines you are taking. These include vitamins, herbs, eye drops, creams, and over-the-counter medicines.  Any problems you or family members have had with medicines that make you fall asleep (anesthetics).  Any blood disorders you have.  Any surgeries you have had.  Any medical conditions you have, such as kidney failure.  Whether you are pregnant or may be pregnant. What are the  risks? This is a safe procedure. But problems may occur, including:  Infection.  Bruising and bleeding.  Bleeding into the chest.  Stroke or blood clots.  Damage to nearby areas of your body.  Allergies to medicines or dyes.  The need for a pacemaker if the normal system is damaged.  Failure of the procedure to treat the problem. What happens before the procedure? Medicines Ask your doctor about:  Changing or stopping your normal medicines. This is important.  Taking aspirin and ibuprofen. Do not take these medicines unless your doctor tells you to take them.  Taking other medicines, vitamins, herbs, and supplements. General instructions  Follow instructions from your doctor about what you cannot eat or drink.  Plan to have someone take you home from the hospital or clinic.  If you will be going home right after the procedure, plan to have someone with you for 24 hours.  Ask your doctor what steps will be taken to prevent infection. What happens during the procedure?  An IV tube will be put into one of your veins.  You will be given a medicine to help you relax.  The skin on your neck or groin will be numbed.  A cut (incision) will be made in your neck or groin. A needle will be put through your cut and into a large vein.  A tube (catheter) will be put into the needle. The tube will be moved to your heart.  Dye may be put through the tube. This helps your doctor see your heart.  Small devices (electrodes) on the tube will send out signals.  A  type of energy will be used to destroy some heart tissue.  The tube will be taken out.  Pressure will be held on your cut. This helps stop bleeding.  A bandage will be put over your cut. The exact procedure may vary among doctors and hospitals.   What happens after the procedure?  You will be watched until you leave the hospital or clinic. This includes checking your heart rate, breathing rate, oxygen, and blood  pressure.  Your cut will be watched for bleeding. You will need to lie still for a few hours.  Do not drive for 24 hours or as long as your doctor tells you. Summary  Cardiac ablation is a procedure to destroy some heart tissue. This is done to treat heart rhythm problems.  Tell your doctor about any medical conditions you may have. Tell him or her about all medicines you are taking to treat them.  This is a safe procedure. But problems may occur. These include infection, bruising, bleeding, and damage to nearby areas of your body.  Follow what your doctor tells you about food and drink. You may also be told to change or stop some of your medicines.  After the procedure, do not drive for 24 hours or as long as your doctor tells you. This information is not intended to replace advice given to you by your health care provider. Make sure you discuss any questions you have with your health care provider. Document Revised: 03/23/2019 Document Reviewed: 03/23/2019 Elsevier Patient Education  2021 [employer].

## 2020-08-02 ENCOUNTER — Ambulatory Visit: Payer: BC Managed Care – PPO | Admitting: Internal Medicine

## 2020-08-13 ENCOUNTER — Other Ambulatory Visit: Payer: Self-pay | Admitting: Family Medicine

## 2020-09-03 ENCOUNTER — Other Ambulatory Visit: Payer: Self-pay | Admitting: Cardiology

## 2020-09-03 ENCOUNTER — Other Ambulatory Visit: Payer: Self-pay | Admitting: Family Medicine

## 2020-09-27 ENCOUNTER — Other Ambulatory Visit: Payer: Self-pay | Admitting: Family Medicine

## 2020-10-19 ENCOUNTER — Other Ambulatory Visit: Payer: Self-pay | Admitting: Family Medicine

## 2020-10-27 ENCOUNTER — Other Ambulatory Visit: Payer: Self-pay | Admitting: Family Medicine

## 2020-10-29 ENCOUNTER — Other Ambulatory Visit: Payer: Self-pay

## 2020-10-31 ENCOUNTER — Other Ambulatory Visit: Payer: Self-pay | Admitting: Family Medicine

## 2020-11-04 ENCOUNTER — Other Ambulatory Visit: Payer: Self-pay | Admitting: Family Medicine

## 2020-11-18 ENCOUNTER — Other Ambulatory Visit: Payer: Self-pay | Admitting: Family Medicine

## 2020-11-28 ENCOUNTER — Other Ambulatory Visit: Payer: Self-pay | Admitting: *Deleted

## 2020-11-28 ENCOUNTER — Other Ambulatory Visit: Payer: Self-pay | Admitting: Family Medicine

## 2020-11-28 ENCOUNTER — Telehealth: Payer: Self-pay

## 2020-11-28 MED ORDER — METOPROLOL TARTRATE 25 MG PO TABS: 25.0000 mg | ORAL_TABLET | Freq: Two times a day (BID) | ORAL | 0 refills | Status: DC

## 2020-11-28 MED ORDER — RIVAROXABAN 20 MG PO TABS: 20.0000 mg | ORAL_TABLET | Freq: Every day | ORAL | 3 refills | Status: DC

## 2020-11-28 NOTE — Telephone Encounter (Signed)
Patient is out of his blood pressure meds.  He doesn't have any for tonight.  He has been trying to get meds filled at pharmacy, they had no idea as to why his refills were not getting approved by Dr. Anitra Lauth. Patient needed OV before filling meds any longer. I scheduled appt for him with Dr. Anitra Lauth tomorrow 7/29. He is still concerned about not having blood pressure med tonight. He has 4 total prescriptions for refill request, but only out of the first one listed below.  metoprolol tartrate (LOPRESSOR) 25 MG tablet [id number]   escitalopram (LEXAPRO) 10 MG tablet [id number]   lisinopril (ZESTRIL) 40 MG tablet [id number]   metFORMIN (GLUCOPHAGE) 500 MG tablet [id number]    [facility] [address]

## 2020-11-28 NOTE — Telephone Encounter (Signed)
Called pt and sent the med that pt is out of. Pt will need to keep appt for remaining refills

## 2020-11-29 ENCOUNTER — Ambulatory Visit: Payer: BC Managed Care – PPO | Admitting: Family Medicine

## 2020-11-29 NOTE — Progress Notes (Deleted)
Office Note 11/29/2020  CC: No chief complaint on file.  HPI:  Riley Moore is a 55 y.o. White male who is here for f/u annual health maintenance exam and f/u HTN, DM, HLD. He has persistent a-fib, followed by electrophysiologist.  Has been on xarelto and rate control meds long term. I last saw him about 8 months ago. A/P as of that visit: "1) Persistent a-fib, rate controlled, asymptomatic. Multiple attempts at cardioversion failed, eventually underwent ablation and until now he's had no problems. CHA2DS2-VASc score is 2. I'll restart his xarelto '20mg'$  qd but have recommended he return to his electrophysiologist, Dr. Rayann Heman ASAP to discuss any ongoing anticoag recommendations +/- options regarding cardioversion, etc. Continue lopressor '25mg'$  bid and cardizem cd 180 qd. CBC and lytes today.   2) DM: compliant with metformin. Hba1c today, lytes/cr today.   3) HLD: tolerating rosuva 20 started by Dr. Debara Pickett about 2 mo ago. Not fasting today.  Hepatic panel today.  Keep approp f/u with Dr. Debara Pickett.   4) HTN: stable.  Lytes/cr today. Continue lopressor, lisinopril, diltiazem.   5) R great toe onychomycosis: I recommended against oral antifungal. I offered penlac topical tx and he did want this--rx sent today.   6) Penile fracture: at the end of visit today he asked what to do about penis fracture, says he sustained it about a year ago, no pain at this time but moderate curvature per pt.  He is interested in seeing if urologist has anything to offer for tx-->referral ordered today."  INTERIM HX: ***  Pt was back in a-fib and saw electrophysiologist again in Feb this year, plan was to proceed with ablation procedure for persistent a-fib/flutter.  DM: Feet->any burning, tingling, or numbness?->***.  Past Medical History:  Diagnosis Date   Anxiety attack    Diabetes mellitus ([place]) 07/2019   prediab 2015.  DM 2 dx 07/2019   Elevated transaminase level    suspect combo of  hyperlip/fatty liver + alcohol.  Viral Hep panel neg 2016.   Gout    multiple episodes ([facility] jt)   History of gluten intolerance    Hyperlipemia    Intolerant of statins: tolerates welchol   Hypertension    Left foot pain 2020   cavus foot type resulting in midfoot capsulitis; posterior heel spur; arthritis first MPJ   Perennial allergic rhinitis    Persistent atrial fibrillation ([place]) 2013; 2019   Dr. Percival Spanish.  Spont conv in ED.  Outpt eval showed nl ECHO--ASA qd and BB/CCB started.  Multiple cardioversions and recurrences, finally got ablation procedure.03/2019--no [place] b/c CHADVASC score low.   Peyronie's disease     Past Surgical History:  Procedure Laterality Date   ATRIAL FIBRILLATION ABLATION N/A 03/31/2019   Procedure: ATRIAL FIBRILLATION ABLATION;  Surgeon: Thompson Grayer, MD;  Location: [facility];  Service: Cardiovascular;  Laterality: N/A;   CARDIOVERSION N/A 06/11/2017   Procedure: CARDIOVERSION;  Surgeon: Skeet Latch, MD;  Location: [facility] [facility];  Service: Cardiovascular;  Laterality: N/A;   CARDIOVERSION N/A 01/23/2019   Procedure: CARDIOVERSION;  Surgeon: Buford Dresser, MD;  Location: [place];  Service: Cardiovascular;  Laterality: N/A;   CARDIOVERSION N/A 05/24/2020   Procedure: CARDIOVERSION;  Surgeon: Jerline Pain, MD;  Location: [facility];  Service: Cardiovascular;  Laterality: N/A;   DC cardioversion  06/11/2017   was back in A-fib within 5 min of waking up.   TRANSTHORACIC ECHOCARDIOGRAM  04/2012   NORMAL (ED 55-65%)    Family History  Problem Relation Age of Onset   Heart disease Father        pacemaker, atrial fibrillation   Hypertension Father     Social History   Socioeconomic History   Marital status: Married    Spouse name: Not on file   Number of children: 2   Years of education: Not on file   Highest education level: Not on file  Occupational History   Occupation: Chief Financial Officer  Tobacco Use   Smoking  status: Never   Smokeless tobacco: Never  Vaping Use   Vaping Use: Never used  Substance and Sexual Activity   Alcohol use: Not Currently    Alcohol/week: 4.0 - 5.0 standard drinks    Types: 4 - 5 Cans of beer per week    Comment: daily   Drug use: No   Sexual activity: Not on file  Other Topics Concern   Not on file  Social History Narrative   Lives at home with wife and two children.     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  Alc: 4 beers per day.  Denies hx of alc problems/abuse.  However, he says he tried quitting in the past and after a few days he started to have "panic attacks".         Social Determinants of Health   Financial Resource Strain: Not on file  Food Insecurity: Not on file  Transportation Needs: Not on file  Physical Activity: Not on file  Stress: Not on file  Social Connections: Not on file  Intimate Partner Violence: Not on file    Outpatient Medications Prior to Visit  Medication Sig Dispense Refill   cetirizine (ZYRTEC) 10 MG tablet Take 10 mg by mouth daily.     ciclopirox (PENLAC) 8 % solution Apply topically at bedtime. Apply over nail and surrounding skin. Apply daily over previous coat. After seven (7) days, may remove with alcohol and continue cycle. 6.6 mL 6   clotrimazole-betamethasone (LOTRISONE) cream APPLY TO AFFECTED AREA TWICE A DAY 30 g 0   diltiazem (CARDIZEM CD) 180 MG 24 hr capsule TAKE 1 CAPSULE BY MOUTH EVERY DAY 90 capsule 3   escitalopram (LEXAPRO) 10 MG tablet TAKE 1 TABLET BY MOUTH EVERY DAY 30 tablet 0   lisinopril (ZESTRIL) 40 MG tablet TAKE 1 TABLET BY MOUTH EVERYDAY AT BEDTIME 30 tablet 0   metFORMIN (GLUCOPHAGE) 500 MG tablet TAKE 1 TAB BY MOUTH EVERY MORNING AND 2 TABS EVERY EVENING . 90 tablet 0   metoprolol tartrate (LOPRESSOR) 25 MG tablet Take 1 tablet (25 mg total) by mouth 2 (two) times daily. 14 tablet 0   rivaroxaban (XARELTO) 20 MG TABS tablet Take 1 tablet (20 mg  total) by mouth daily with supper. 90 tablet 3   No facility-administered medications prior to visit.    Allergies  Allergen Reactions   Pravastatin     Decreased energy, "wiped out"    ROS *** PE; Vitals with BMI 07/01/2020 06/03/2020 05/24/2020  Height '6\' 2"'$  '6\' 2"'$  -  Weight 222 lbs 10 oz 223 lbs -  BMI [id number] -  Systolic [id number] -  Diastolic 74 76 -  Pulse 78 62 63     *** Pertinent labs:  Lab Results  Component Value Date   TSH 2.04 07/17/2019   Lab Results  Component Value Date   WBC 8.4 03/06/2020   HGB 16.7 05/24/2020   HCT 49.0 05/24/2020  MCV 92.7 03/06/2020   PLT 177.0 03/06/2020   Lab Results  Component Value Date   CREATININE 0.80 05/24/2020   BUN 18 05/24/2020   NA 138 05/24/2020   K 4.5 05/24/2020   CL 102 05/24/2020   CO2 26 03/06/2020   Lab Results  Component Value Date   ALT 108 (H) 03/06/2020   AST 83 (H) 03/06/2020   ALKPHOS 58 03/06/2020   BILITOT 0.6 03/06/2020   Lab Results  Component Value Date   CHOL 226 (H) 10/19/2019   Lab Results  Component Value Date   HDL 33.40 (L) 10/19/2019   Lab Results  Component Value Date   LDLCALC 133 (H) 04/26/2017   Lab Results  Component Value Date   TRIG 276.0 (H) 10/19/2019   Lab Results  Component Value Date   CHOLHDL 7 10/19/2019   Lab Results  Component Value Date   PSA 0.33 10/30/2014   Lab Results  Component Value Date   HGBA1C 7.1 (H) 03/06/2020   ASSESSMENT AND PLAN:   No problem-specific Assessment & Plan notes found for this encounter.  Health maintenance exam: Reviewed age and gender appropriate health maintenance issues (prudent diet, regular exercise, health risks of tobacco and excessive alcohol, use of seatbelts, fire alarms in home, use of sunscreen).  Also reviewed age and gender appropriate health screening as well as vaccine recommendations. Vaccines: Prevnar 20->***.  Shingrix #2->***.  Otherwise UTD. Labs: fasting HP, Hba1c, PSA. Prostate ca  screening: PSA today. Colon ca screening: pt has never had screening.  He is average risk.  Discussed options today-->***.   An After Visit Summary was printed and given to the patient.  FOLLOW UP:  No follow-ups on file.  Signed:  Crissie Sickles, MD           11/29/2020

## 2020-12-06 ENCOUNTER — Other Ambulatory Visit: Payer: Self-pay | Admitting: Family Medicine

## 2020-12-06 ENCOUNTER — Encounter: Payer: Self-pay | Admitting: Family Medicine

## 2020-12-06 ENCOUNTER — Other Ambulatory Visit: Payer: Self-pay

## 2020-12-06 ENCOUNTER — Ambulatory Visit: Payer: BC Managed Care – PPO | Admitting: Family Medicine

## 2020-12-06 VITALS — BP 116/80 | HR 84 | Temp 97.5°F | Resp 16 | Ht 74.0 in | Wt 219.6 lb

## 2020-12-06 DIAGNOSIS — Z125 Encounter for screening for malignant neoplasm of prostate: Secondary | ICD-10-CM

## 2020-12-06 DIAGNOSIS — Z23 Encounter for immunization: Secondary | ICD-10-CM | POA: Diagnosis not present

## 2020-12-06 DIAGNOSIS — I4819 Other persistent atrial fibrillation: Secondary | ICD-10-CM | POA: Diagnosis not present

## 2020-12-06 DIAGNOSIS — I10 Essential (primary) hypertension: Secondary | ICD-10-CM

## 2020-12-06 DIAGNOSIS — E7800 Pure hypercholesterolemia, unspecified: Secondary | ICD-10-CM

## 2020-12-06 DIAGNOSIS — E119 Type 2 diabetes mellitus without complications: Secondary | ICD-10-CM

## 2020-12-06 DIAGNOSIS — Z0000 Encounter for general adult medical examination without abnormal findings: Secondary | ICD-10-CM

## 2020-12-06 DIAGNOSIS — Z1211 Encounter for screening for malignant neoplasm of colon: Secondary | ICD-10-CM

## 2020-12-06 MED ORDER — LISINOPRIL 40 MG PO TABS: ORAL_TABLET | ORAL | 3 refills | Status: DC

## 2020-12-06 MED ORDER — METOPROLOL TARTRATE 25 MG PO TABS: 25.0000 mg | ORAL_TABLET | Freq: Two times a day (BID) | ORAL | 3 refills | Status: DC

## 2020-12-06 MED ORDER — ESCITALOPRAM OXALATE 10 MG PO TABS: 10.0000 mg | ORAL_TABLET | Freq: Every day | ORAL | 3 refills | Status: DC

## 2020-12-06 MED ORDER — METFORMIN HCL 500 MG PO TABS: ORAL_TABLET | ORAL | 6 refills | Status: DC

## 2020-12-06 NOTE — Progress Notes (Signed)
Office Note 12/06/2020  CC:  Chief Complaint  Patient presents with   Follow-up    RCI    HPI:  Riley Moore is a 55 y.o. White male who is here for annual health maintenance exam and 9 mo f/u DM 2, HTN, HLD, and persistent a-fib. A/P as of last visit: "1) Persistent a-fib, rate controlled, asymptomatic. Multiple attempts at cardioversion failed, eventually underwent ablation and until now he's had no problems. CHA2DS2-VASc score is 2. I'll restart his xarelto '20mg'$  qd but have recommended he return to his electrophysiologist, Dr. Rayann Heman ASAP to discuss any ongoing anticoag recommendations +/- options regarding cardioversion, etc. Continue lopressor '25mg'$  bid and cardizem cd 180 qd. CBC and lytes today.   2) DM: compliant with metformin. Hba1c today, lytes/cr today.   3) HLD: tolerating rosuva 20 started by Dr. Debara Pickett about 2 mo ago. Not fasting today.  Hepatic panel today.  Keep approp f/u with Dr. Debara Pickett.   4) HTN: stable.  Lytes/cr today. Continue lopressor, lisinopril, diltiazem.   5) R great toe onychomycosis: I recommended against oral antifungal. I offered penlac topical tx and he did want this--rx sent today.   6) Penile fracture: at the end of visit today he asked what to do about penis fracture, says he sustained it about a year ago, no pain at this time but moderate curvature per pt.  He is interested in seeing if urologist has anything to offer for tx-->referral ordered today."  INTERIM HX: Feeling fine.  Last f/u with electrophys MD 06/2020, he was back in a-fib.  This was after having successful cardioversion at the end of 2021 and he had been able to come off antiarrhythmic and [place]. EP study with ablation procedure was discussed with EP MD 06/2020 and pt was considering things but never scheduled anything. He took xarelto after 06/2020 for a while but ran out.  Has rx to fill now.  DM: he can't recall home glucoses well, estimates 135-150 best of his  recollection.  HTN: no home bp monitoring.  Ran out of toprol recently.  Lisin 40 qd, cardizem cd 180 qd.   HLD: no longer on any lipid medication.  He had to miss a couple visits with Dr. Debara Pickett and hasn't rescheduled. He has not tolerated statins.   Past Medical History:  Diagnosis Date   Anxiety attack    Diabetes mellitus ([place]) 07/2019   prediab 2015.  DM 2 dx 07/2019   Elevated transaminase level    suspect combo of hyperlip/fatty liver + alcohol.  Viral Hep panel neg 2016.   Gout    multiple episodes (L MTP jt)   History of gluten intolerance    Hyperlipemia    Intolerant of statins: tolerates welchol   Hypertension    Left foot pain 2020   cavus foot type resulting in midfoot capsulitis; posterior heel spur; arthritis first MPJ   Perennial allergic rhinitis    Persistent atrial fibrillation ([place]) 2013; 2019   Dr. Percival Spanish.  Spont conv in ED.  Outpt eval showed nl ECHO--ASA qd and BB/CCB started.  Multiple cardioversions and recurrences, finally got ablation procedure.03/2019--no [place] b/c CHADVASC score low.   Peyronie's disease     Past Surgical History:  Procedure Laterality Date   ATRIAL FIBRILLATION ABLATION N/A 03/31/2019   Procedure: ATRIAL FIBRILLATION ABLATION;  Surgeon: Thompson Grayer, MD;  Location: [facility];  Service: Cardiovascular;  Laterality: N/A;   CARDIOVERSION N/A 06/11/2017   Procedure: CARDIOVERSION;  Surgeon: Skeet Latch,  MD;  Location: [facility];  Service: Cardiovascular;  Laterality: N/A;   CARDIOVERSION N/A 01/23/2019   Procedure: CARDIOVERSION;  Surgeon: Buford Dresser, MD;  Location: [facility];  Service: Cardiovascular;  Laterality: N/A;   CARDIOVERSION N/A 05/24/2020   Procedure: CARDIOVERSION;  Surgeon: Jerline Pain, MD;  Location[facility];  Service: Cardiovascular;  Laterality: N/A;   DC cardioversion  06/11/2017   was back in A-fib within 5 min of waking up.   TRANSTHORACIC ECHOCARDIOGRAM  04/2012   NORMAL (ED  55-65%)    Family History  Problem Relation Age of Onset   Heart disease Father        pacemaker, atrial fibrillation   Hypertension Father     Social History   Socioeconomic History   Marital status: Married    Spouse name: Not on file   Number of children: 2   Years of education: Not on file   Highest education level: Not on file  Occupational History   Occupation: Chief Financial Officer  Tobacco Use   Smoking status: Never   Smokeless tobacco: Never  Vaping Use   Vaping Use: Never used  Substance and Sexual Activity   Alcohol use: Not Currently    Alcohol/week: 4.0 - 5.0 standard drinks    Types: 4 - 5 Cans of beer per week    Comment: daily   Drug use: No   Sexual activity: Not on file  Other Topics Concern   Not on file  Social History Narrative   Lives at home with wife and two children.     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  Alc: 4 beers per day.  Denies hx of alc problems/abuse.  However, he says he tried quitting in the past and after a few days he started to have "panic attacks".         Social Determinants of Health   Financial Resource Strain: Not on file  Food Insecurity: Not on file  Transportation Needs: Not on file  Physical Activity: Not on file  Stress: Not on file  Social Connections: Not on file  Intimate Partner Violence: Not on file    Outpatient Medications Prior to Visit  Medication Sig Dispense Refill   cetirizine (ZYRTEC) 10 MG tablet Take 10 mg by mouth daily.     ciclopirox (PENLAC) 8 % solution Apply topically at bedtime. Apply over nail and surrounding skin. Apply daily over previous coat. After seven (7) days, may remove with alcohol and continue cycle. 6.6 mL 6   clotrimazole-betamethasone (LOTRISONE) cream APPLY TO AFFECTED AREA TWICE A DAY 30 g 0   diltiazem (CARDIZEM CD) 180 MG 24 hr capsule TAKE 1 CAPSULE BY MOUTH EVERY DAY 90 capsule 3   rivaroxaban (XARELTO) 20 MG TABS  tablet Take 1 tablet (20 mg total) by mouth daily with supper. (Patient not taking: Reported on 12/06/2020) 90 tablet 3   escitalopram (LEXAPRO) 10 MG tablet TAKE 1 TABLET BY MOUTH EVERY DAY 30 tablet 0   lisinopril (ZESTRIL) 40 MG tablet TAKE 1 TABLET BY MOUTH EVERYDAY AT BEDTIME 30 tablet 0   metFORMIN (GLUCOPHAGE) 500 MG tablet TAKE 1 TAB BY MOUTH EVERY MORNING AND 2 TABS EVERY EVENING . 90 tablet 0   metoprolol tartrate (LOPRESSOR) 25 MG tablet Take 1 tablet (25 mg total) by mouth 2 (two) times daily. 14 tablet 0   No facility-administered medications prior to visit.    Allergies  Allergen Reactions  Pravastatin     Decreased energy, "wiped out"    ROS Review of Systems  Constitutional:  Negative for appetite change, chills, fatigue and fever.  HENT:  Negative for congestion, dental problem, ear pain and sore throat.   Eyes:  Negative for discharge, redness and visual disturbance.  Respiratory:  Negative for cough, chest tightness, shortness of breath and wheezing.   Cardiovascular:  Negative for chest pain, palpitations and leg swelling.  Gastrointestinal:  Negative for abdominal pain, blood in stool, diarrhea, nausea and vomiting.  Genitourinary:  Negative for difficulty urinating, dysuria, flank pain, frequency, hematuria and urgency.  Musculoskeletal:  Negative for arthralgias, back pain, joint swelling, myalgias and neck stiffness.  Skin:  Negative for pallor and rash.  Neurological:  Negative for dizziness, speech difficulty, weakness and headaches.  Hematological:  Negative for adenopathy. Does not bruise/bleed easily.  Psychiatric/Behavioral:  Negative for confusion and sleep disturbance. The patient is not nervous/anxious.    PE; Vitals with BMI 12/06/2020 07/01/2020 06/03/2020  Height '6\' 2"'$  '6\' 2"'$  '6\' 2"'$   Weight 219 lbs 10 oz 222 lbs 10 oz 223 lbs  BMI 28.18 [id number]  Systolic [id number]  Diastolic 80 74 76  Pulse 84 78 62   Gen: Alert, well appearing.  Patient is  oriented to person, place, time, and situation. AFFECT: pleasant, lucid thought and speech. ENT: Ears: EACs clear, normal epithelium.  TMs with good light reflex and landmarks bilaterally.  Eyes: no injection, icteris, swelling, or exudate.  EOMI, PERRLA. Nose: no drainage or turbinate edema/swelling.  No injection or focal lesion.  Mouth: lips without lesion/swelling.  Oral mucosa pink and moist.  Dentition intact and without obvious caries or gingival swelling.  Oropharynx without erythema, exudate, or swelling.  Neck: supple/nontender.  No LAD, mass, or TM.  Carotid pulses 2+ bilaterally, without bruits. CV: Irreg irreg, rate 80s, no m/r/g.   LUNGS: CTA bilat, nonlabored resps, good aeration in all lung fields. ABD: soft, NT, ND, BS normal.  No hepatospenomegaly or mass.  No bruits. EXT: no clubbing, cyanosis, or edema.  Musculoskeletal: no joint swelling, erythema, warmth, or tenderness.  ROM of all joints intact. Skin - no sores or suspicious lesions or rashes or color changes  Pertinent labs:  Lab Results  Component Value Date   TSH 2.04 07/17/2019   Lab Results  Component Value Date   LABURIC 7.2 10/25/2014   Lab Results  Component Value Date   WBC 8.4 03/06/2020   HGB 16.7 05/24/2020   HCT 49.0 05/24/2020   MCV 92.7 03/06/2020   PLT 177.0 03/06/2020   Lab Results  Component Value Date   CREATININE 0.80 05/24/2020   BUN 18 05/24/2020   NA 138 05/24/2020   K 4.5 05/24/2020   CL 102 05/24/2020   CO2 26 03/06/2020   Lab Results  Component Value Date   ALT 108 (H) 03/06/2020   AST 83 (H) 03/06/2020   ALKPHOS 58 03/06/2020   BILITOT 0.6 03/06/2020   Lab Results  Component Value Date   CHOL 226 (H) 10/19/2019   Lab Results  Component Value Date   HDL 33.40 (L) 10/19/2019   Lab Results  Component Value Date   LDLCALC 133 (H) 04/26/2017   Lab Results  Component Value Date   TRIG 276.0 (H) 10/19/2019   Lab Results  Component Value Date   CHOLHDL 7  10/19/2019   Lab Results  Component Value Date   PSA 0.33 10/30/2014   Lab Results  Component Value Date   HGBA1C 7.1 (H) 03/06/2020   ASSESSMENT AND PLAN:   1) Persistent a-fib: followed by EP, Dr. Rayann Heman, ablation to be scheduled soon--pt has f/u next week.  I encouraged him to go ahead and restart xarelto. Cont cardizem CD 180 qd and lopressor 25 bid. Lytes/cr, TSH, cbc ordered.  2) DM 2: taking '500mg'$  metformin qAM and 1000 qPM. Hba1c, urine microalb/cr, and lytes/cr ordered.  3) HLD: not fasting today.  Intol of at least 2 statins and he declines trial of any further statins.  He'll try to get back in with dr. Debara Pickett in future.  ?[id number] trial?  4) HTN: well controlled on lopressor '25mg'$  bid, cardizem cd 180 qd, lisinopril 40 qd. Lytes/cr ordered.  5) Health maintenance exam: Reviewed age and gender appropriate health maintenance issues (prudent diet, regular exercise, health risks of tobacco and excessive alcohol, use of seatbelts, fire alarms in home, use of sunscreen).  Also reviewed age and gender appropriate health screening as well as vaccine recommendations. Vaccines: Prevnar 20->given today.  Shingrix #2->given today.  Otherwise UTD. Labs: HP, Hba1c, PSA, and urine microalb/cr ordered. Prostate ca screening:  PSA ordered. Colon ca screening: GI referral ordered.  An After Visit Summary was printed and given to the patient.  FOLLOW UP:  Return in about 6 months (around 06/08/2021) for routine chronic illness f/u.  Signed:  Crissie Sickles, MD           12/06/2020

## 2020-12-06 NOTE — Addendum Note (Signed)
Addended by: Deveron Furlong D on: 12/06/2020 04:12 PM   Modules accepted: Orders

## 2020-12-07 LAB — COMPREHENSIVE METABOLIC PANEL
AG Ratio: 1.5 (calc) (ref 1.0–2.5)
ALT: 128 U/L — ABNORMAL HIGH (ref 9–46)
AST: 91 U/L — ABNORMAL HIGH (ref 10–35)
Albumin: 4.1 g/dL (ref 3.6–5.1)
Alkaline phosphatase (APISO): 51 U/L (ref 35–144)
BUN: 12 mg/dL (ref 7–25)
CO2: 25 mmol/L (ref 20–32)
Calcium: 9.1 mg/dL (ref 8.6–10.3)
Chloride: 99 mmol/L (ref 98–110)
Creat: 0.89 mg/dL (ref 0.70–1.30)
Globulin: 2.8 g/dL (calc) (ref 1.9–3.7)
Glucose, Bld: 176 mg/dL — ABNORMAL HIGH (ref 65–99)
Potassium: 4.1 mmol/L (ref 3.5–5.3)
Sodium: 135 mmol/L (ref 135–146)
Total Bilirubin: 0.8 mg/dL (ref 0.2–1.2)
Total Protein: 6.9 g/dL (ref 6.1–8.1)

## 2020-12-07 LAB — CBC WITH DIFFERENTIAL/PLATELET
Absolute Monocytes: 781 cells/uL (ref 200–950)
Basophils Absolute: 42 cells/uL (ref 0–200)
Basophils Relative: 0.5 %
Eosinophils Absolute: 294 cells/uL (ref 15–500)
Eosinophils Relative: 3.5 %
HCT: 43.7 % (ref 38.5–50.0)
Hemoglobin: 14.7 g/dL (ref 13.2–17.1)
Lymphs Abs: 2327 cells/uL (ref 850–3900)
MCH: 31.8 pg (ref 27.0–33.0)
MCHC: 33.6 g/dL (ref 32.0–36.0)
MCV: 94.6 fL (ref 80.0–100.0)
MPV: 11.2 fL (ref 7.5–12.5)
Monocytes Relative: 9.3 %
Neutro Abs: 4956 cells/uL (ref 1500–7800)
Neutrophils Relative %: 59 %
Platelets: 171 10*3/uL (ref 140–400)
RBC: 4.62 10*6/uL (ref 4.20–5.80)
RDW: 12.7 % (ref 11.0–15.0)
Total Lymphocyte: 27.7 %
WBC: 8.4 10*3/uL (ref 3.8–10.8)

## 2020-12-07 LAB — HEMOGLOBIN A1C
Hgb A1c MFr Bld: 7.2 % of total Hgb — ABNORMAL HIGH (ref <5.7)
Mean Plasma Glucose: 160 mg/dL
eAG (mmol/L): 8.9 mmol/L

## 2020-12-07 LAB — PSA: PSA: 0.26 ng/mL (ref <=4.00)

## 2020-12-09 ENCOUNTER — Other Ambulatory Visit: Payer: Self-pay

## 2020-12-09 MED ORDER — METFORMIN HCL 500 MG PO TABS: ORAL_TABLET | ORAL | 6 refills | Status: DC

## 2020-12-11 ENCOUNTER — Other Ambulatory Visit: Payer: Self-pay

## 2020-12-11 ENCOUNTER — Encounter: Payer: Self-pay | Admitting: Internal Medicine

## 2020-12-11 ENCOUNTER — Ambulatory Visit: Payer: BC Managed Care – PPO | Admitting: Internal Medicine

## 2020-12-11 ENCOUNTER — Encounter: Payer: Self-pay | Admitting: *Deleted

## 2020-12-11 VITALS — BP 118/80 | HR 77 | Ht 74.0 in | Wt 215.4 lb

## 2020-12-11 DIAGNOSIS — I10 Essential (primary) hypertension: Secondary | ICD-10-CM

## 2020-12-11 DIAGNOSIS — I4819 Other persistent atrial fibrillation: Secondary | ICD-10-CM

## 2020-12-11 DIAGNOSIS — I4892 Unspecified atrial flutter: Secondary | ICD-10-CM

## 2020-12-11 NOTE — Patient Instructions (Addendum)
Medication Instructions:  Your physician recommends that you continue on your current medications as directed. Please refer to the Current Medication list given to you today.  Labwork: None ordered.  Testing/Procedures: Your physician has requested that you have an echocardiogram. Echocardiography is a painless test that uses sound waves to create images of your heart. It provides your doctor with information about the size and shape of your heart and how well your heart's chambers and valves are working. This procedure takes approximately one hour. There are no restrictions for this procedure.  Your physician has requested that you have cardiac CT. Cardiac computed tomography (CT) is a painless test that uses an x-ray machine to take clear, detailed pictures of your heart. For further information please visit [facility]. Please follow instruction sheet as given.  Your physician has recommended that you have an ablation. Catheter ablation is a medical procedure used to treat some cardiac arrhythmias (irregular heartbeats). During catheter ablation, a long, thin, flexible tube is put into a blood vessel in your groin (upper thigh), or neck. This tube is called an ablation catheter. It is then guided to your heart through the blood vessel. Radio frequency waves destroy small areas of heart tissue where abnormal heartbeats may cause an arrhythmia to start. Please see the instruction sheet given to you today.    Any Other Special Instructions Will Be Listed Below (If Applicable).  If you need a refill on your cardiac medications before your next appointment, please call your pharmacy.   Cardiac Ablation Cardiac ablation is a procedure to destroy (ablate) some heart tissue that is sending bad signals. These bad signals causeproblems in heart rhythm. The heart has many areas that make these signals. If there are problems in these areas, they can make the heart beat in a way that is not  normal.Destroying some tissues can help make the heart rhythm normal. Tell your doctor about: Any allergies you have. All medicines you are taking. These include vitamins, herbs, eye drops, creams, and over-the-counter medicines. Any problems you or family members have had with medicines that make you fall asleep (anesthetics). Any blood disorders you have. Any surgeries you have had. Any medical conditions you have, such as kidney failure. Whether you are pregnant or may be pregnant. What are the risks? This is a safe procedure. But problems may occur, including: Infection. Bruising and bleeding. Bleeding into the chest. Stroke or blood clots. Damage to nearby areas of your body. Allergies to medicines or dyes. The need for a pacemaker if the normal system is damaged. Failure of the procedure to treat the problem. What happens before the procedure? Medicines Ask your doctor about: Changing or stopping your normal medicines. This is important. Taking aspirin and ibuprofen. Do not take these medicines unless your doctor tells you to take them. Taking other medicines, vitamins, herbs, and supplements. General instructions Follow instructions from your doctor about what you cannot eat or drink. Plan to have someone take you home from the hospital or clinic. If you will be going home right after the procedure, plan to have someone with you for 24 hours. Ask your doctor what steps will be taken to prevent infection. What happens during the procedure?  An IV tube will be put into one of your veins. You will be given a medicine to help you relax. The skin on your neck or groin will be numbed. A cut (incision) will be made in your neck or groin. A needle will be put through your  cut and into a large vein. A tube (catheter) will be put into the needle. The tube will be moved to your heart. Dye may be put through the tube. This helps your doctor see your heart. Small devices (electrodes)  on the tube will send out signals. A type of energy will be used to destroy some heart tissue. The tube will be taken out. Pressure will be held on your cut. This helps stop bleeding. A bandage will be put over your cut. The exact procedure may vary among doctors and hospitals. What happens after the procedure? You will be watched until you leave the hospital or clinic. This includes checking your heart rate, breathing rate, oxygen, and blood pressure. Your cut will be watched for bleeding. You will need to lie still for a few hours. Do not drive for 24 hours or as long as your doctor tells you. Summary Cardiac ablation is a procedure to destroy some heart tissue. This is done to treat heart rhythm problems. Tell your doctor about any medical conditions you may have. Tell him or her about all medicines you are taking to treat them. This is a safe procedure. But problems may occur. These include infection, bruising, bleeding, and damage to nearby areas of your body. Follow what your doctor tells you about food and drink. You may also be told to change or stop some of your medicines. After the procedure, do not drive for 24 hours or as long as your doctor tells you. This information is not intended to replace advice given to you by your health care provider. Make sure you discuss any questions you have with your healthcare provider. Document Revised: 03/23/2019 Document Reviewed: 03/23/2019 Elsevier Patient Education  2022 [employer].

## 2020-12-11 NOTE — Progress Notes (Signed)
PCP: Tammi Sou, MD   Riley Moore is a 55 y.o. male who presents today for routine electrophysiology followup.  Since his recent afib ablation, the patient reports doing very well.  he denies procedure related complications and is pleased with the results of the procedure.  Today, he denies symptoms of palpitations, chest pain, shortness of breath,  lower extremity edema, dizziness, presyncope, or syncope.  The patient is otherwise without complaint today.   Past Medical History:  Diagnosis Date   Anxiety attack    Diabetes mellitus ([place]) 07/2019   prediab 2015.  DM 2 dx 07/2019   Elevated transaminase level    suspect combo of hyperlip/fatty liver + alcohol.  Viral Hep panel neg 2016.   Gout    multiple episodes (L MTP jt)   History of gluten intolerance    Hyperlipemia    Intolerant of statins: tolerates welchol   Hypertension    Left foot pain 2020   cavus foot type resulting in midfoot capsulitis; posterior heel spur; arthritis first MPJ   Perennial allergic rhinitis    Persistent atrial fibrillation ([place]) 2013; 2019   Dr. Percival Spanish.  Spont conv in ED.  Outpt eval showed nl ECHO--ASA qd and BB/CCB started.  Multiple cardioversions and recurrences, finally got ablation procedure.03/2019--no [place] b/c CHADVASC score low.   Peyronie's disease    Past Surgical History:  Procedure Laterality Date   ATRIAL FIBRILLATION ABLATION N/A 03/31/2019   Procedure: ATRIAL FIBRILLATION ABLATION;  Surgeon: Thompson Grayer, MD;  Location: [facility];  Service: Cardiovascular;  Laterality: N/A;   CARDIOVERSION N/A 06/11/2017   Procedure: CARDIOVERSION;  Surgeon: Skeet Latch, MD;  Location: [facility];  Service: Cardiovascular;  Laterality: N/A;   CARDIOVERSION N/A 01/23/2019   Procedure: CARDIOVERSION;  Surgeon: Buford Dresser, MD;  Location: [facility];  Service: Cardiovascular;  Laterality: N/A;   CARDIOVERSION N/A 05/24/2020   Procedure: CARDIOVERSION;  Surgeon:  Jerline Pain, MD;  Location: [facility];  Service: Cardiovascular;  Laterality: N/A;   DC cardioversion  06/11/2017   was back in A-fib within 5 min of waking up.   TRANSTHORACIC ECHOCARDIOGRAM  04/2012   NORMAL (ED 55-65%)    ROS- all systems are personally reviewed and negatives except as per HPI above  Current Outpatient Medications  Medication Sig Dispense Refill   cetirizine (ZYRTEC) 10 MG tablet Take 10 mg by mouth daily.     clotrimazole-betamethasone (LOTRISONE) cream APPLY TO AFFECTED AREA TWICE A DAY 30 g 0   diltiazem (CARDIZEM CD) 180 MG 24 hr capsule TAKE 1 CAPSULE BY MOUTH EVERY DAY 90 capsule 3   escitalopram (LEXAPRO) 10 MG tablet Take 1 tablet (10 mg total) by mouth daily. 90 tablet 3   lisinopril (ZESTRIL) 40 MG tablet TAKE 1 TABLET BY MOUTH EVERYDAY AT BEDTIME 90 tablet 3   metFORMIN (GLUCOPHAGE) 500 MG tablet TAKE 2 TABS BY MOUTH EVERY MORNING AND 2 TABS EVERY EVENING . 270 tablet 6   metoprolol tartrate (LOPRESSOR) 25 MG tablet Take 1 tablet (25 mg total) by mouth 2 (two) times daily. 180 tablet 3   rivaroxaban (XARELTO) 20 MG TABS tablet Take 1 tablet (20 mg total) by mouth daily with supper. 90 tablet 3   ciclopirox (PENLAC) 8 % solution Apply topically at bedtime. Apply over nail and surrounding skin. Apply daily over previous coat. After seven (7) days, may remove with alcohol and continue cycle. (Patient not taking: Reported on 12/11/2020) 6.6 mL 6   No  current facility-administered medications for this visit.    Physical Exam: Vitals:   12/11/20 0833  BP: 118/80  Pulse: 77  SpO2: 97%  Weight: 215 lb 6.4 oz (97.7 kg)  Height: '6\' 2"'$  (1.88 m)    GEN- The patient is well appearing, alert and oriented x 3 today.   Head- normocephalic, atraumatic Eyes-  Sclera clear, conjunctiva pink Ears- hearing intact Oropharynx- clear Lungs- Clear to ausculation bilaterally, normal work of breathing Heart- Regular rate and rhythm, no murmurs, rubs or gallops, PMI  not laterally displaced GI- soft, NT, ND, + BS Extremities- no clubbing, cyanosis, or edema  EKG tracing ordered today is personally reviewed and shows afib  Echo 02/09/19- EF 60%, moderate biatrial enlargement  Assessment and Plan:  1. Persistent atrial fibrillation/ atrial flutter S/p ablation 03/31/19 (PVI, CTI) chads2vasc score is 1.  The patient has symptomatic, recurrent persistent atrial fibrillation. he has failed medical therapy with flecainide. Chads2vasc score is 1.  he is anticoagulated with xarelto . Therapeutic strategies for afib including medicine and ablation were discussed in detail with the patient today. Risk, benefits, and alternatives to EP study and radiofrequency ablation for afib were also discussed in detail today. These risks include but are not limited to stroke, bleeding, vascular damage, tamponade, perforation, damage to the esophagus, lungs, and other structures, pulmonary vein stenosis, worsening renal function, and death. The patient understands these risk and wishes to proceed.  We will therefore proceed with catheter ablation at the next available time.  Carto, ICE, anesthesia are requested for the procedure.  Will also obtain cardiac CT prior to the procedure to exclude LAA thrombus and further evaluate atrial anatomy. We will obtain an echo to evaluate for structural changes related to his afib.     Thompson Grayer MD, [facility] 12/11/2020 8:51 AM

## 2020-12-13 ENCOUNTER — Other Ambulatory Visit: Payer: Self-pay | Admitting: Family Medicine

## 2020-12-13 NOTE — Addendum Note (Signed)
Addended by: Mendel Ryder on: 12/13/2020 11:35 AM   Modules accepted: Orders

## 2020-12-31 ENCOUNTER — Other Ambulatory Visit: Payer: Self-pay

## 2020-12-31 ENCOUNTER — Ambulatory Visit (HOSPITAL_COMMUNITY): Payer: BC Managed Care – PPO | Attending: Cardiovascular Disease

## 2020-12-31 DIAGNOSIS — I10 Essential (primary) hypertension: Secondary | ICD-10-CM | POA: Diagnosis not present

## 2020-12-31 DIAGNOSIS — I4892 Unspecified atrial flutter: Secondary | ICD-10-CM

## 2020-12-31 DIAGNOSIS — I4819 Other persistent atrial fibrillation: Secondary | ICD-10-CM | POA: Diagnosis not present

## 2020-12-31 LAB — ECHOCARDIOGRAM COMPLETE
Area-P 1/2: 4.26 cm2
S' Lateral: 2 cm

## 2021-01-08 ENCOUNTER — Encounter: Payer: Self-pay | Admitting: Family Medicine

## 2021-01-17 ENCOUNTER — Other Ambulatory Visit: Payer: BC Managed Care – PPO | Admitting: *Deleted

## 2021-01-17 ENCOUNTER — Other Ambulatory Visit: Payer: Self-pay

## 2021-01-17 DIAGNOSIS — I10 Essential (primary) hypertension: Secondary | ICD-10-CM | POA: Diagnosis not present

## 2021-01-17 DIAGNOSIS — I4892 Unspecified atrial flutter: Secondary | ICD-10-CM

## 2021-01-17 DIAGNOSIS — I4819 Other persistent atrial fibrillation: Secondary | ICD-10-CM

## 2021-01-18 LAB — BASIC METABOLIC PANEL
BUN/Creatinine Ratio: 19 (ref 9–20)
BUN: 15 mg/dL (ref 6–24)
CO2: 23 mmol/L (ref 20–29)
Calcium: 9 mg/dL (ref 8.7–10.2)
Chloride: 100 mmol/L (ref 96–106)
Creatinine, Ser: 0.77 mg/dL (ref 0.76–1.27)
Glucose: 198 mg/dL — ABNORMAL HIGH (ref 65–99)
Potassium: 4.5 mmol/L (ref 3.5–5.2)
Sodium: 138 mmol/L (ref 134–144)
eGFR: 105 mL/min/{1.73_m2} (ref >59)

## 2021-01-18 LAB — CBC WITH DIFFERENTIAL/PLATELET
Basophils Absolute: 0 10*3/uL (ref 0.0–0.2)
Basos: 1 %
EOS (ABSOLUTE): 0.2 10*3/uL (ref 0.0–0.4)
Eos: 3 %
Hematocrit: 42 % (ref 37.5–51.0)
Hemoglobin: 14.5 g/dL (ref 13.0–17.7)
Immature Grans (Abs): 0 10*3/uL (ref 0.0–0.1)
Immature Granulocytes: 0 %
Lymphocytes Absolute: 2 10*3/uL (ref 0.7–3.1)
Lymphs: 26 %
MCH: 32.1 pg (ref 26.6–33.0)
MCHC: 34.5 g/dL (ref 31.5–35.7)
MCV: 93 fL (ref 79–97)
Monocytes Absolute: 0.8 10*3/uL (ref 0.1–0.9)
Monocytes: 10 %
Neutrophils Absolute: 4.7 10*3/uL (ref 1.4–7.0)
Neutrophils: 60 %
Platelets: 161 10*3/uL (ref 150–450)
RBC: 4.52 x10E6/uL (ref 4.14–5.80)
RDW: 12.2 % (ref 11.6–15.4)
WBC: 7.7 10*3/uL (ref 3.4–10.8)

## 2021-01-28 ENCOUNTER — Telehealth (HOSPITAL_COMMUNITY): Payer: Self-pay | Admitting: *Deleted

## 2021-01-28 NOTE — Telephone Encounter (Signed)
Reaching out to patient to offer assistance regarding upcoming cardiac imaging study; pt verbalizes understanding of appt date/time, parking situation and where to check in, pre-test NPO status  and verified current allergies; name and call back number provided for further questions should they arise ? ?Mychal Durio RN Navigator Cardiac Imaging ?[facility] ?[phone] office ?[phone] cell ? ?

## 2021-01-30 ENCOUNTER — Ambulatory Visit (HOSPITAL_COMMUNITY)
Admission: RE | Admit: 2021-01-30 | Discharge: 2021-01-30 | Disposition: A | Discharge status: Home / Self Care | Payer: BC Managed Care – PPO | Source: Ambulatory Visit | Attending: Internal Medicine | Admitting: Internal Medicine

## 2021-01-30 ENCOUNTER — Other Ambulatory Visit: Payer: Self-pay

## 2021-01-30 DIAGNOSIS — I4819 Other persistent atrial fibrillation: Secondary | ICD-10-CM | POA: Diagnosis not present

## 2021-01-30 MED ORDER — IOHEXOL 350 MG/ML SOLN
80.0000 mL | Freq: Once | INTRAVENOUS | Status: AC | PRN
  Administered 2021-01-30: 80 mL via INTRAVENOUS

## 2021-02-06 ENCOUNTER — Other Ambulatory Visit: Payer: Self-pay

## 2021-02-06 ENCOUNTER — Ambulatory Visit (HOSPITAL_COMMUNITY)
Admission: RE | Admit: 2021-02-06 | Discharge: 2021-02-06 | Disposition: A | Discharge status: Home / Self Care | Payer: BC Managed Care – PPO | Attending: Internal Medicine | Admitting: Internal Medicine

## 2021-02-06 ENCOUNTER — Encounter (HOSPITAL_COMMUNITY)
Admission: RE | Disposition: A | Discharge status: Home / Self Care | Payer: BC Managed Care – PPO | Source: Home / Self Care | Attending: Internal Medicine

## 2021-02-06 ENCOUNTER — Ambulatory Visit (HOSPITAL_COMMUNITY): Payer: BC Managed Care – PPO | Admitting: Anesthesiology

## 2021-02-06 DIAGNOSIS — I4892 Unspecified atrial flutter: Secondary | ICD-10-CM | POA: Diagnosis not present

## 2021-02-06 DIAGNOSIS — Z7901 Long term (current) use of anticoagulants: Secondary | ICD-10-CM | POA: Diagnosis not present

## 2021-02-06 DIAGNOSIS — Z7984 Long term (current) use of oral hypoglycemic drugs: Secondary | ICD-10-CM | POA: Diagnosis not present

## 2021-02-06 DIAGNOSIS — E119 Type 2 diabetes mellitus without complications: Secondary | ICD-10-CM | POA: Diagnosis not present

## 2021-02-06 DIAGNOSIS — Z79899 Other long term (current) drug therapy: Secondary | ICD-10-CM | POA: Insufficient documentation

## 2021-02-06 DIAGNOSIS — E785 Hyperlipidemia, unspecified: Secondary | ICD-10-CM | POA: Insufficient documentation

## 2021-02-06 DIAGNOSIS — I10 Essential (primary) hypertension: Secondary | ICD-10-CM | POA: Diagnosis not present

## 2021-02-06 DIAGNOSIS — I4819 Other persistent atrial fibrillation: Secondary | ICD-10-CM | POA: Insufficient documentation

## 2021-02-06 HISTORY — PX: ATRIAL FIBRILLATION ABLATION: EP1191

## 2021-02-06 LAB — GLUCOSE, CAPILLARY
Glucose-Capillary: 169 mg/dL — ABNORMAL HIGH (ref 70–99)
Glucose-Capillary: 165 mg/dL — ABNORMAL HIGH (ref 70–99)

## 2021-02-06 SURGERY — ATRIAL FIBRILLATION ABLATION
Anesthesia: General

## 2021-02-06 MED ORDER — ONDANSETRON HCL 4 MG/2ML IJ SOLN
INTRAMUSCULAR | Status: DC | PRN
  Administered 2021-02-06: 4 mg via INTRAVENOUS

## 2021-02-06 MED ORDER — HYDROCODONE-ACETAMINOPHEN 5-325 MG PO TABS: 1.0000 | ORAL_TABLET | ORAL | Status: DC | PRN

## 2021-02-06 MED ORDER — MIDAZOLAM HCL 2 MG/2ML IJ SOLN
INTRAMUSCULAR | Status: DC | PRN
  Administered 2021-02-06: 2 mg via INTRAVENOUS

## 2021-02-06 MED ORDER — PANTOPRAZOLE SODIUM 40 MG PO TBEC: 40.0000 mg | DELAYED_RELEASE_TABLET | Freq: Every day | ORAL | 0 refills | Status: DC

## 2021-02-06 MED ORDER — PROTAMINE SULFATE 10 MG/ML IV SOLN
INTRAVENOUS | Status: DC | PRN
  Administered 2021-02-06: 30 mg via INTRAVENOUS

## 2021-02-06 MED ORDER — PROPOFOL 10 MG/ML IV BOLUS
INTRAVENOUS | Status: DC | PRN
  Administered 2021-02-06: 150 mg via INTRAVENOUS

## 2021-02-06 MED ORDER — ONDANSETRON HCL 4 MG/2ML IJ SOLN: 4.0000 mg | Freq: Four times a day (QID) | INTRAMUSCULAR | Status: DC | PRN

## 2021-02-06 MED ORDER — FENTANYL CITRATE (PF) 250 MCG/5ML IJ SOLN
INTRAMUSCULAR | Status: DC | PRN
  Administered 2021-02-06 (×2): 50 ug via INTRAVENOUS

## 2021-02-06 MED ORDER — SODIUM CHLORIDE 0.9 % IV SOLN: INTRAVENOUS | Status: DC

## 2021-02-06 MED ORDER — PHENYLEPHRINE HCL-NACL 20-0.9 MG/250ML-% IV SOLN
INTRAVENOUS | Status: DC | PRN
  Administered 2021-02-06: 25 ug/min via INTRAVENOUS

## 2021-02-06 MED ORDER — SODIUM CHLORIDE 0.9 % IV SOLN: 250.0000 mL | INTRAVENOUS | Status: DC | PRN

## 2021-02-06 MED ORDER — HEPARIN SODIUM (PORCINE) 1000 UNIT/ML IJ SOLN
INTRAMUSCULAR | Status: DC | PRN
  Administered 2021-02-06: 15000 [IU] via INTRAVENOUS
  Administered 2021-02-06: 1000 [IU] via INTRAVENOUS

## 2021-02-06 MED ORDER — DEXAMETHASONE SODIUM PHOSPHATE 10 MG/ML IJ SOLN
INTRAMUSCULAR | Status: DC | PRN
  Administered 2021-02-06: 4 mg via INTRAVENOUS

## 2021-02-06 MED ORDER — SODIUM CHLORIDE 0.9% FLUSH: 3.0000 mL | INTRAVENOUS | Status: DC | PRN

## 2021-02-06 MED ORDER — ROCURONIUM BROMIDE 10 MG/ML (PF) SYRINGE
PREFILLED_SYRINGE | INTRAVENOUS | Status: DC | PRN
  Administered 2021-02-06: 20 mg via INTRAVENOUS
  Administered 2021-02-06: 60 mg via INTRAVENOUS

## 2021-02-06 MED ORDER — HEPARIN SODIUM (PORCINE) 1000 UNIT/ML IJ SOLN
INTRAMUSCULAR | Status: DC | PRN
  Administered 2021-02-06: 1000 [IU] via INTRAVENOUS

## 2021-02-06 MED ORDER — SODIUM CHLORIDE 0.9% FLUSH: 3.0000 mL | Freq: Two times a day (BID) | INTRAVENOUS | Status: DC

## 2021-02-06 MED ORDER — ACETAMINOPHEN 325 MG PO TABS: 650.0000 mg | ORAL_TABLET | ORAL | Status: DC | PRN

## 2021-02-06 MED ORDER — HEPARIN (PORCINE) IN NACL 1000-0.9 UT/500ML-% IV SOLN
INTRAVENOUS | Status: AC
  Filled 2021-02-06: qty 1500

## 2021-02-06 MED ORDER — SUGAMMADEX SODIUM 200 MG/2ML IV SOLN
INTRAVENOUS | Status: DC | PRN
  Administered 2021-02-06: 200 mg via INTRAVENOUS

## 2021-02-06 MED ORDER — HEPARIN (PORCINE) IN NACL 1000-0.9 UT/500ML-% IV SOLN
INTRAVENOUS | Status: DC | PRN
  Administered 2021-02-06 (×3): 500 mL

## 2021-02-06 MED ORDER — LIDOCAINE 2% (20 MG/ML) 5 ML SYRINGE
INTRAMUSCULAR | Status: DC | PRN
  Administered 2021-02-06: 60 mg via INTRAVENOUS

## 2021-02-06 SURGICAL SUPPLY — 17 items
BLANKET WARM UNDERBOD FULL ACC (MISCELLANEOUS) ×2 IMPLANT
CATH OCTARAY 2.0 F 3-3-3-3-3 (CATHETERS) ×2 IMPLANT
CATH SMTCH THERMOCOOL SF DF (CATHETERS) ×2 IMPLANT
CATH SOUNDSTAR ECO 8FR (CATHETERS) ×2 IMPLANT
CATH WEB BI DIR CSDF CRV REPRO (CATHETERS) ×2 IMPLANT
CLOSURE PERCLOSE PROSTYLE (VASCULAR PRODUCTS) ×6 IMPLANT
COVER SWIFTLINK CONNECTOR (BAG) ×2 IMPLANT
NEEDLE BAYLIS TRANSSEPTAL 71CM (NEEDLE) ×2 IMPLANT
PACK EP LATEX FREE (CUSTOM PROCEDURE TRAY) ×2
PACK EP LF (CUSTOM PROCEDURE TRAY) ×1 IMPLANT
PAD PRO RADIOLUCENT 2001M-C (PAD) ×2 IMPLANT
PATCH CARTO3 (PAD) ×2 IMPLANT
SHEATH PINNACLE 7F 10CM (SHEATH) ×4 IMPLANT
SHEATH PINNACLE 9F 10CM (SHEATH) ×2 IMPLANT
SHEATH PROBE COVER 6X72 (BAG) ×2 IMPLANT
SHEATH TORFLEX 8.5FR 45 (SHEATH) ×2 IMPLANT
TUBING SMART ABLATE COOLFLOW (TUBING) ×2 IMPLANT

## 2021-02-06 NOTE — Anesthesia Procedure Notes (Signed)
Procedure Name: Intubation Date/Time: 02/06/2021 10:20 AM Performed by: Mariea Clonts, CRNA Pre-anesthesia Checklist: Patient identified, Emergency Drugs available, Suction available and Patient being monitored Patient Re-evaluated:Patient Re-evaluated prior to induction Oxygen Delivery Method: Circle System Utilized Preoxygenation: Pre-oxygenation with 100% oxygen Induction Type: IV induction Ventilation: Mask ventilation without difficulty Tube type: Oral Number of attempts: 1 Airway Equipment and Method: Stylet and Oral airway Placement Confirmation: ETT inserted through vocal cords under direct vision, positive ETCO2 and breath sounds checked- equal and bilateral Tube secured with: Tape Dental Injury: Teeth and Oropharynx as per pre-operative assessment

## 2021-02-06 NOTE — Discharge Instructions (Signed)

## 2021-02-06 NOTE — H&P (Signed)
PCP: Tammi Sou, MD   Riley Moore is a 55 y.o. male who presents today for electrophysiology study and ablation for afib.     Today, he denies symptoms of palpitations, chest pain, shortness of breath,  lower extremity edema, dizziness, presyncope, or syncope.  The patient is otherwise without complaint today.        Past Medical History:  Diagnosis Date   Anxiety attack     Diabetes mellitus ([place]) 07/2019    prediab 2015.  DM 2 dx 07/2019   Elevated transaminase level      suspect combo of hyperlip/fatty liver + alcohol.  Viral Hep panel neg 2016.   Gout      multiple episodes (L MTP jt)   History of gluten intolerance     Hyperlipemia      Intolerant of statins: tolerates welchol   Hypertension     Left foot pain 2020    cavus foot type resulting in midfoot capsulitis; posterior heel spur; arthritis first MPJ   Perennial allergic rhinitis     Persistent atrial fibrillation ([place]) 2013; 2019    Dr. Percival Spanish.  Spont conv in ED.  Outpt eval showed nl ECHO--ASA qd and BB/CCB started.  Multiple cardioversions and recurrences, finally got ablation procedure.03/2019--no [place] b/c CHADVASC score low.   Peyronie's disease           Past Surgical History:  Procedure Laterality Date   ATRIAL FIBRILLATION ABLATION N/A 03/31/2019    Procedure: ATRIAL FIBRILLATION ABLATION;  Surgeon: Thompson Grayer, MD;  Location: [place] [facility];  Service: Cardiovascular;  Laterality: N/A;   CARDIOVERSION N/A 06/11/2017    Procedure: CARDIOVERSION;  Surgeon: Skeet Latch, MD;  Location: [place];  Service: Cardiovascular;  Laterality: N/A;   CARDIOVERSION N/A 01/23/2019    Procedure: CARDIOVERSION;  Surgeon: Buford Dresser, MD;  Location: [place];  Service: Cardiovascular;  Laterality: N/A;   CARDIOVERSION N/A 05/24/2020    Procedure: CARDIOVERSION;  Surgeon: Jerline Pain, MD;  Location: [facility];  Service: Cardiovascular;  Laterality: N/A;   DC cardioversion   06/11/2017     was back in A-fib within 5 min of waking up.   TRANSTHORACIC ECHOCARDIOGRAM   04/2012    NORMAL (ED 55-65%)      ROS- all systems are personally reviewed and negatives except as per HPI above         Current Outpatient Medications  Medication Sig Dispense Refill   cetirizine (ZYRTEC) 10 MG tablet Take 10 mg by mouth daily.       clotrimazole-betamethasone (LOTRISONE) cream APPLY TO AFFECTED AREA TWICE A DAY 30 g 0   diltiazem (CARDIZEM CD) 180 MG 24 hr capsule TAKE 1 CAPSULE BY MOUTH EVERY DAY 90 capsule 3   escitalopram (LEXAPRO) 10 MG tablet Take 1 tablet (10 mg total) by mouth daily. 90 tablet 3   lisinopril (ZESTRIL) 40 MG tablet TAKE 1 TABLET BY MOUTH EVERYDAY AT BEDTIME 90 tablet 3   metFORMIN (GLUCOPHAGE) 500 MG tablet TAKE 2 TABS BY MOUTH EVERY MORNING AND 2 TABS EVERY EVENING . 270 tablet 6   metoprolol tartrate (LOPRESSOR) 25 MG tablet Take 1 tablet (25 mg total) by mouth 2 (two) times daily. 180 tablet 3   rivaroxaban (XARELTO) 20 MG TABS tablet Take 1 tablet (20 mg total) by mouth daily with supper. 90 tablet 3   ciclopirox (PENLAC) 8 % solution Apply topically at bedtime. Apply over nail and surrounding skin. Apply daily over previous coat. After  seven (7) days, may remove with alcohol and continue cycle. (Patient not taking: Reported on 12/11/2020) 6.6 mL 6    No current facility-administered medications for this visit.      Physical Exam:    Vitals:    12/11/20 0833  BP: 118/80  Pulse: 77  SpO2: 97%  Weight: 215 lb 6.4 oz (97.7 kg)  Height: 6\' 2"  (1.88 m)      GEN- The patient is well appearing, alert and oriented x 3 today.   Head- normocephalic, atraumatic Eyes-  Sclera clear, conjunctiva pink Ears- hearing intact Oropharynx- clear Lungs- Clear to ausculation bilaterally, normal work of breathing Heart- Regular rate and rhythm, no murmurs, rubs or gallops, PMI not laterally displaced GI- soft, NT, ND, + BS Extremities- no clubbing, cyanosis, or edema      Assessment and Plan:   1. Persistent atrial fibrillation/ atrial flutter S/p ablation 03/31/19 (PVI, CTI) chads2vasc score is 1.   The patient has symptomatic, recurrent persistent atrial fibrillation. he has failed medical therapy with flecainide. Chads2vasc score is 1.  he is anticoagulated with xarelto .    Risk, benefits, and alternatives to EP study and radiofrequency ablation for afib were again discussed in detail today. These risks include but are not limited to stroke, bleeding, vascular damage, tamponade, perforation, damage to the esophagus, lungs, and other structures, pulmonary vein stenosis, worsening renal function, and death. The patient understands these risk and wishes to proceed.    Cardiac CT reviewed at length with the patient today.  he reports compliance with Rew without interruption.  Thompson Grayer MD, [place] 02/06/2021 9:47 AM

## 2021-02-06 NOTE — Transfer of Care (Signed)
Immediate Anesthesia Transfer of Care Note  Patient: Riley Moore  Procedure(s) Performed: ATRIAL FIBRILLATION ABLATION  Patient Location: PACU and Cath Lab  Anesthesia Type:General  Level of Consciousness: awake, alert  and oriented  Airway & Oxygen Therapy: Patient Spontanous Breathing and Patient connected to nasal cannula oxygen  Post-op Assessment: Report given to RN and Post -op Vital signs reviewed and stable  Post vital signs: Reviewed and stable  Last Vitals:  Vitals Value Taken Time  BP 102/69 02/06/21 1235  Temp    Pulse 72 02/06/21 1236  Resp 17 02/06/21 1235  SpO2 98 % 02/06/21 1236  Vitals shown include unvalidated device data.  Last Pain:  Vitals:   02/06/21 0856  TempSrc: Oral         Complications: No notable events documented.

## 2021-02-06 NOTE — Anesthesia Preprocedure Evaluation (Signed)
Anesthesia Evaluation  Patient identified by MRN, date of birth, ID band Patient awake    Reviewed: Allergy & Precautions, NPO status , Patient's Chart, lab work & pertinent test results  Airway Mallampati: II  TM Distance: >3 FB     Dental  (+) Dental Advisory Given   Pulmonary neg pulmonary ROS,    breath sounds clear to auscultation       Cardiovascular hypertension, Pt. on medications and Pt. on home beta blockers + dysrhythmias Atrial Fibrillation  Rhythm:Irregular Rate:Normal     Neuro/Psych negative neurological ROS     GI/Hepatic negative GI ROS, Neg liver ROS,   Endo/Other  diabetes, Type 2  Renal/GU negative Renal ROS     Musculoskeletal   Abdominal   Peds  Hematology negative hematology ROS (+)   Anesthesia Other Findings   Reproductive/Obstetrics                             Lab Results  Component Value Date   WBC 7.7 01/17/2021   HGB 14.5 01/17/2021   HCT 42.0 01/17/2021   MCV 93 01/17/2021   PLT 161 01/17/2021   Lab Results  Component Value Date   CREATININE 0.77 01/17/2021   BUN 15 01/17/2021   NA 138 01/17/2021   K 4.5 01/17/2021   CL 100 01/17/2021   CO2 23 01/17/2021    Anesthesia Physical Anesthesia Plan  ASA: 2  Anesthesia Plan: General   Post-op Pain Management:    Induction: Intravenous  PONV Risk Score and Plan: 2 and Dexamethasone, Ondansetron and Treatment may vary due to age or medical condition  Airway Management Planned: Oral ETT  Additional Equipment: None  Intra-op Plan:   Post-operative Plan: Extubation in OR  Informed Consent: I have reviewed the patients History and Physical, chart, labs and discussed the procedure including the risks, benefits and alternatives for the proposed anesthesia with the patient or authorized representative who has indicated his/her understanding and acceptance.     Dental advisory given  Plan  Discussed with: CRNA  Anesthesia Plan Comments:         Anesthesia Quick Evaluation

## 2021-02-06 NOTE — Anesthesia Postprocedure Evaluation (Signed)
Anesthesia Post Note  Patient: Riley Moore  Procedure(s) Performed: ATRIAL FIBRILLATION ABLATION     Patient location during evaluation: PACU Anesthesia Type: General Level of consciousness: awake and alert Pain management: pain level controlled Vital Signs Assessment: post-procedure vital signs reviewed and stable Respiratory status: spontaneous breathing, nonlabored ventilation, respiratory function stable and patient connected to nasal cannula oxygen Cardiovascular status: blood pressure returned to baseline and stable Postop Assessment: no apparent nausea or vomiting Anesthetic complications: no   No notable events documented.  Last Vitals:  Vitals:   02/06/21 1435 02/06/21 1450  BP: 104/66 108/65  Pulse: 74 73  Resp: 14 11  Temp:    SpO2: 99% 97%    Last Pain:  Vitals:   02/06/21 1418  TempSrc:   PainSc: 0-No pain                 Tiajuana Amass

## 2021-02-06 NOTE — Progress Notes (Signed)
Pt ambulated without difficulty or bleeding.   Discharged home with his wife who will drive and stay with pt x 24 hrs. 

## 2021-02-07 ENCOUNTER — Encounter (HOSPITAL_COMMUNITY): Payer: Self-pay | Admitting: Internal Medicine

## 2021-02-07 LAB — POCT ACTIVATED CLOTTING TIME
Activated Clotting Time: 335 seconds
Activated Clotting Time: 341 seconds

## 2021-02-11 ENCOUNTER — Other Ambulatory Visit: Payer: Self-pay | Admitting: Internal Medicine

## 2021-02-12 ENCOUNTER — Other Ambulatory Visit: Payer: Self-pay | Admitting: *Deleted

## 2021-03-05 ENCOUNTER — Ambulatory Visit (HOSPITAL_COMMUNITY)
Admission: RE | Admit: 2021-03-05 | Discharge: 2021-03-05 | Disposition: A | Discharge status: Home / Self Care | Payer: BC Managed Care – PPO | Source: Ambulatory Visit | Attending: Nurse Practitioner | Admitting: Nurse Practitioner

## 2021-03-05 ENCOUNTER — Other Ambulatory Visit: Payer: Self-pay

## 2021-03-05 ENCOUNTER — Encounter (HOSPITAL_COMMUNITY): Payer: Self-pay | Admitting: Nurse Practitioner

## 2021-03-05 VITALS — BP 128/86 | HR 62 | Ht 74.0 in | Wt 212.2 lb

## 2021-03-05 DIAGNOSIS — D6869 Other thrombophilia: Secondary | ICD-10-CM | POA: Diagnosis not present

## 2021-03-05 DIAGNOSIS — Z8249 Family history of ischemic heart disease and other diseases of the circulatory system: Secondary | ICD-10-CM | POA: Diagnosis not present

## 2021-03-05 DIAGNOSIS — I10 Essential (primary) hypertension: Secondary | ICD-10-CM | POA: Diagnosis not present

## 2021-03-05 DIAGNOSIS — I4892 Unspecified atrial flutter: Secondary | ICD-10-CM | POA: Diagnosis not present

## 2021-03-05 DIAGNOSIS — Z7901 Long term (current) use of anticoagulants: Secondary | ICD-10-CM | POA: Diagnosis not present

## 2021-03-05 DIAGNOSIS — I4819 Other persistent atrial fibrillation: Secondary | ICD-10-CM | POA: Diagnosis not present

## 2021-03-05 NOTE — Progress Notes (Signed)
Primary Care Physician: Tammi Sou, MD Referring Physician: [facility] ER f/u Primary EP: Dr Zadie Cleverly Riley Moore is a 55 y.o. male with a h/o afib first onset in 2013. He did well for a number of years, staying in [place], but recently found to be in [place] in April 29, 2017, when he went to the ER for chest pain. His chest pain resolved but he continued to be in afib. It was rate controlled. He was unaware. He did not drink large amounts of caffeine but stated that he drank  a six pack a night and may increase to 8 on the weekends for anxiety. He reported  that he would have panic attacks if he cut  down on his beer intake. He has been on ASA daily, diltiazem and metoprolol for years. Chadsvasc score is 1 for HTN.  When I initially saw him, I was concerned to start anticoagulation with his alcoholism and wanted him to see his PCP for means of treating his anxiety, other than with alcohol. He was pending a f/u with Dr. Percival Spanish within the month.   He was started on anticoagulation and cardioverted, however he had ERAF before leaving the hospital. He was told to come back for an antiarrythmic when he was able to stop alcohol. He just did f/u with Dr. Percival Spanish again 11/24/18. He has stopped alcohol a while back and wanted to try to get back in rhythm. The plan was for pt to start anticoagulation x 3 weeks and then start flecainide 50 mg bid. I was to see pt a few days after starting flecainide. the pt has not started xarelto yet but does plan to start 8/10. Hence,  I am seeing pt prematurely in the midst of this plan.  He states that he is still  asymptomatic with afib and is rate controlled. If he fails AAD/DCCV, he wants to pursue ablation.  F/u in afib clinic, 9/3. He is now on flecainide 50 mg bid. He remains in atrial flutter at 83 bpm. He is tolerating flecainide, feels no different on drug. No missed doses of xarelto since starting.   He was increased to flecainide 100 mg bid and cardioverted,  9/21.. This was successful but he is now back in [place], unsure of onset. He is not aware of afib/flutter.   Follow up AF clinic 05/01/19. Patient is s/p afib and flutter ablation with Dr Rayann Heman on 03/31/19. He reports that he has done well since his ablation from a cardiac standpoint. He did have COVID-19 and is recovering from that. He denies any heart racing or palpitations. He denies CP, swallowing, or groin issues.  F/u with in afib clinic, 03/05/21,  with second afib ablation, one month ago.He reports that he has not had any afib since the procedure. He is back to his usual activities. He continues on xarelto. SR on ekg today. No groin or swallowing issues.   Today, he denies symptoms of palpitations, chest pain, shortness of breath, orthopnea, PND, lower extremity edema, dizziness, presyncope, syncope, or neurologic sequela. The patient is tolerating medications without difficulties and is otherwise without complaint today.   Past Medical History:  Diagnosis Date   Anxiety attack    Diabetes mellitus ([place]) 07/2019   prediab 2015.  DM 2 dx 07/2019   Elevated transaminase level    suspect combo of hyperlip/fatty liver + alcohol.  Viral Hep panel neg 2016.   Gout    multiple episodes (L MTP jt)  History of gluten intolerance    Hyperlipemia    Intolerant of statins: tolerates welchol   Hypertension    Left foot pain 2020   cavus foot type resulting in midfoot capsulitis; posterior heel spur; arthritis first MPJ   Perennial allergic rhinitis    Persistent atrial fibrillation ([place]) 2013; 2019   Dr. Percival Spanish.  Spont conv in ED.  Outpt eval showed nl ECHO--ASA qd and BB/CCB started.  Multiple cardioversions and recurrences, finally got ablation procedure.03/2019--no [place] b/c CHADVASC score low. Recurrence->ablation again 02/2021   Peyronie's disease    Past Surgical History:  Procedure Laterality Date   ATRIAL FIBRILLATION ABLATION N/A 03/31/2019   Procedure: ATRIAL FIBRILLATION  ABLATION;  Surgeon: Thompson Grayer, MD;  Location: [facility];  Service: Cardiovascular;  Laterality: N/A;   ATRIAL FIBRILLATION ABLATION N/A 02/06/2021   Procedure: ATRIAL FIBRILLATION ABLATION;  Surgeon: Thompson Grayer, MD;  Location: [facility] [facility];  Service: Cardiovascular;  Laterality: N/A;   CARDIOVERSION N/A 06/11/2017   Procedure: CARDIOVERSION;  Surgeon: Skeet Latch, MD;  Location: [place];  Service: Cardiovascular;  Laterality: N/A;   CARDIOVERSION N/A 01/23/2019   Procedure: CARDIOVERSION;  Surgeon: Buford Dresser, MD;  Location: [place];  Service: Cardiovascular;  Laterality: N/A;   CARDIOVERSION N/A 05/24/2020   Procedure: CARDIOVERSION;  Surgeon: Jerline Pain, MD;  Location: [facility] ENDOSCOPY;  Service: Cardiovascular;  Laterality: N/A;   DC cardioversion  06/11/2017   was back in A-fib within 5 min of waking up.   TRANSTHORACIC ECHOCARDIOGRAM  04/2012   2013-NORMAL (ED 55-65%).  01/2021 EF 60-65%, valves normal. 12/2020 EF 65-70%, normal.    Current Outpatient Medications  Medication Sig Dispense Refill   cetirizine (ZYRTEC) 10 MG tablet Take 10 mg by mouth daily.     clotrimazole-betamethasone (LOTRISONE) cream APPLY TO AFFECTED AREA TWICE A DAY 30 g 0   diltiazem (CARDIZEM CD) 180 MG 24 hr capsule TAKE 1 CAPSULE BY MOUTH EVERY DAY 90 capsule 3   escitalopram (LEXAPRO) 10 MG tablet Take 1 tablet (10 mg total) by mouth daily. 90 tablet 3   lisinopril (ZESTRIL) 40 MG tablet TAKE 1 TABLET BY MOUTH EVERYDAY AT BEDTIME 90 tablet 3   metFORMIN (GLUCOPHAGE) 500 MG tablet TAKE 2 TABS BY MOUTH EVERY MORNING AND 2 TABS EVERY EVENING . 270 tablet 6   metoprolol tartrate (LOPRESSOR) 25 MG tablet Take 1 tablet (25 mg total) by mouth 2 (two) times daily. 180 tablet 3   rivaroxaban (XARELTO) 20 MG TABS tablet Take 1 tablet (20 mg total) by mouth daily with supper. 90 tablet 3   No current facility-administered medications for this encounter.    Allergies   Allergen Reactions   Pravastatin     Decreased energy, "wiped out"    Social History   Socioeconomic History   Marital status: Married    Spouse name: Not on file   Number of children: 2   Years of education: Not on file   Highest education level: Not on file  Occupational History   Occupation: Chief Financial Officer  Tobacco Use   Smoking status: Never   Smokeless tobacco: Never  Vaping Use   Vaping Use: Never used  Substance and Sexual Activity   Alcohol use: Not Currently    Alcohol/week: 4.0 - 5.0 standard drinks    Types: 4 - 5 Cans of beer per week    Comment: daily   Drug use: No   Sexual activity: Not on file  Other Topics Concern  Not on file  Social History Narrative   Lives at home with wife and two children.     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  Alc: 4 beers per day.  Denies hx of alc problems/abuse.  However, he says he tried quitting in the past and after a few days he started to have "panic attacks".         Social Determinants of Health   Financial Resource Strain: Not on file  Food Insecurity: Not on file  Transportation Needs: Not on file  Physical Activity: Not on file  Stress: Not on file  Social Connections: Not on file  Intimate Partner Violence: Not on file    Family History  Problem Relation Age of Onset   Heart disease Father        pacemaker, atrial fibrillation   Hypertension Father     ROS- All systems are reviewed and negative except as per the HPI above  Physical Exam: Vitals:   03/05/21 0835  BP: 128/86  Pulse: 62  Weight: 96.3 kg  Height: 6\' 2"  (1.88 m)   Wt Readings from Last 3 Encounters:  03/05/21 96.3 kg  02/06/21 97.5 kg  12/11/20 97.7 kg    Labs: Lab Results  Component Value Date   NA 138 01/17/2021   K 4.5 01/17/2021   CL 100 01/17/2021   CO2 23 01/17/2021   GLUCOSE 198 (H) 01/17/2021   BUN 15 01/17/2021   CREATININE 0.77 01/17/2021   CALCIUM  9.0 01/17/2021   MG 1.9 03/06/2020   No results found for: INR Lab Results  Component Value Date   CHOL 226 (H) 10/19/2019   HDL 33.40 (L) 10/19/2019   LDLCALC 133 (H) 04/26/2017   TRIG 276.0 (H) 10/19/2019    GEN- The patient is well appearing, alert and oriented x 3 today.   HEENT-head normocephalic, atraumatic, sclera clear, conjunctiva pink, hearing intact, trachea midline. Lungs- Clear to ausculation bilaterally, normal work of breathing Heart- Regular rate and rhythm, no murmurs, rubs or gallops  GI- soft, NT, ND, + BS Extremities- no clubbing, cyanosis, or edema MS- no significant deformity or atrophy Skin- no rash or lesion Psych- euthymic mood, full affect Neuro- strength and sensation are intact   EKG- NSR at 62 bpm, pr int 164 ms, qrs int 406 ms  qtc 406 ms   Echo- 02/09/19  1. Left ventricular ejection fraction, by visual estimation, is 60 to 65%. The left ventricle has normal function. Normal left ventricular size. There is mildly increased left ventricular hypertrophy.  2. Left ventricular diastolic Doppler parameters are indeterminate pattern of LV diastolic filling.  3. Global right ventricle has normal systolic function.The right ventricular size is normal. No increase in right ventricular wall thickness.  4. Left atrial size was moderately dilated.  5. Right atrial size was moderately dilated.  6. The mitral valve is normal in structure. Mild mitral valve regurgitation. No evidence of mitral stenosis.  7. The tricuspid valve is normal in structure. Tricuspid valve regurgitation was not visualized by color flow Doppler.  8. The aortic valve is normal in structure. Aortic valve regurgitation was not visualized by color flow Doppler. Structurally normal aortic valve, with no evidence of sclerosis or stenosis.  9. The pulmonic valve was normal in structure. Pulmonic valve regurgitation is not visualized by color flow Doppler. 10. The inferior vena cava is normal in  size with greater than 50% respiratory variability, suggesting right atrial  pressure of 3 mmHg.   Assessment and Plan: 1. Persistent  asymptomatic afib/flutter  S/p afib and flutter ablation 03/31/19. 2nd ablation one month ago  Patient appears to be maintaining SR. Continue  diltiazem 180 mg daily  Continue on Xarelto 20 mg  without missing any doses   This patients CHA2DS2-VASc Score and unadjusted Ischemic Stroke Rate (% per year) is equal to 0.6 % stroke rate/year from a score of 1  Above score calculated as 1 point each if present [CHF, HTN, DM, Vascular=MI/PAD/Aortic Plaque, Age if 65-74, or Male] Above score calculated as 2 points each if present [Age > 75, or Stroke/TIA/TE]   2. HTN  Stable, no changes today.   Follow up with Dr Rayann Heman 05/08/21   Geroge Baseman. Fleta Borgeson, [facility] [address] [place], [place] [address] [phone]

## 2021-03-06 ENCOUNTER — Ambulatory Visit (HOSPITAL_COMMUNITY): Payer: BC Managed Care – PPO | Admitting: Nurse Practitioner

## 2021-03-26 ENCOUNTER — Other Ambulatory Visit: Payer: Self-pay | Admitting: Family Medicine

## 2021-04-09 ENCOUNTER — Encounter: Payer: Self-pay | Admitting: Gastroenterology

## 2021-04-25 ENCOUNTER — Ambulatory Visit (INDEPENDENT_AMBULATORY_CARE_PROVIDER_SITE_OTHER): Payer: BC Managed Care – PPO | Admitting: Sports Medicine

## 2021-04-25 ENCOUNTER — Other Ambulatory Visit: Payer: Self-pay

## 2021-04-25 ENCOUNTER — Ambulatory Visit (INDEPENDENT_AMBULATORY_CARE_PROVIDER_SITE_OTHER): Payer: BC Managed Care – PPO

## 2021-04-25 DIAGNOSIS — S99912A Unspecified injury of left ankle, initial encounter: Secondary | ICD-10-CM | POA: Insufficient documentation

## 2021-04-25 DIAGNOSIS — M7989 Other specified soft tissue disorders: Secondary | ICD-10-CM | POA: Diagnosis not present

## 2021-04-25 MED ORDER — TRAMADOL HCL 50 MG PO TABS: 50.0000 mg | ORAL_TABLET | Freq: Three times a day (TID) | ORAL | 0 refills | Status: DC | PRN

## 2021-04-25 NOTE — Assessment & Plan Note (Signed)
This is a very pleasant 55 year old male, he had an inversion injury about 4 days ago, immediate pain, swelling, bruising. On exam he has tenderness over the lateral malleolus, he also has a positive anterior drawer sign. I am concerned about a fibular fracture and ATFL tear. Adding a cam boot, tramadol, home conditioning exercises, x-rays. Return to see me in 2 weeks.

## 2021-04-25 NOTE — Progress Notes (Signed)
° ° °  Procedures performed today:    None.  Independent interpretation of notes and tests performed by another provider:   None.  Brief History, Exam, Impression, and Recommendations:    Injury of ankle, left This is a very pleasant 55 year old male, he had an inversion injury about 4 days ago, immediate pain, swelling, bruising. On exam he has tenderness over the lateral malleolus, he also has a positive anterior drawer sign. I am concerned about a fibular fracture and ATFL tear. Adding a cam boot, tramadol, home conditioning exercises, x-rays. Return to see me in 2 weeks.    ___________________________________________ Gwen Her. Dianah Field, M.D., ABFM., CAQSM. Primary Care and [place] [employer] [facility]

## 2021-04-29 ENCOUNTER — Other Ambulatory Visit: Payer: Self-pay | Admitting: Family Medicine

## 2021-04-29 MED ORDER — CLOTRIMAZOLE-BETAMETHASONE 1-0.05 % EX CREA: TOPICAL_CREAM | CUTANEOUS | 1 refills | Status: DC

## 2021-04-29 NOTE — Telephone Encounter (Signed)
RF request for lotrisone  LOV: 12/06/20 Next ov:  Return in about 6 months (around 06/08/2021) for routine chronic illness f/u. Last written: 10/31/20(30g,0)  Please review and advise. Med pending

## 2021-04-29 NOTE — Telephone Encounter (Signed)
Pt called in needing refill of  clotrimazole-betamethasone ([place]) cream  [id number] [address], [place] [address]  [place], [place] [address]  Phone:  [phone]  Fax:  [phone]

## 2021-04-30 NOTE — Telephone Encounter (Signed)
Pt advised refill sent. °

## 2021-05-08 ENCOUNTER — Encounter: Payer: Self-pay | Admitting: Internal Medicine

## 2021-05-08 ENCOUNTER — Other Ambulatory Visit: Payer: Self-pay

## 2021-05-08 ENCOUNTER — Ambulatory Visit: Payer: BC Managed Care – PPO | Admitting: Internal Medicine

## 2021-05-08 VITALS — BP 100/64 | HR 65 | Ht 74.0 in | Wt 210.0 lb

## 2021-05-08 DIAGNOSIS — I4819 Other persistent atrial fibrillation: Secondary | ICD-10-CM

## 2021-05-08 DIAGNOSIS — I4892 Unspecified atrial flutter: Secondary | ICD-10-CM

## 2021-05-08 MED ORDER — METOPROLOL TARTRATE 25 MG PO TABS: 12.5000 mg | ORAL_TABLET | Freq: Two times a day (BID) | ORAL | 0 refills | Status: DC

## 2021-05-08 NOTE — Patient Instructions (Addendum)
Medication Instructions:  Stop Xarelto Reduce Metoprolol tartrate to 12.5 mg two times daily for 6 weeks then stop  Your physician recommends that you continue on your current medications as directed. Please refer to the Current Medication list given to you today. *If you need a refill on your cardiac medications before your next appointment, please call your pharmacy*  Lab Work: None. If you have labs (blood work) drawn today and your tests are completely normal, you will receive your results only by: [place] (if you have MyChart) OR A paper copy in the mail If you have any lab test that is abnormal or we need to change your treatment, we will call you to review the results.  Testing/Procedures: None.  Follow-Up: At [facility], you and your health needs are our priority.  As part of our continuing mission to provide you with exceptional heart care, we have created designated Provider Care Teams.  These Care Teams include your primary Cardiologist (physician) and Advanced Practice Providers (APPs -  Physician Assistants and Nurse Practitioners) who all work together to provide you with the care you need, when you need it.  Your physician wants you to follow-up in: 4 months with Thompson Grayer, MD     You will receive a reminder letter in the mail two months in advance. If you don't receive a letter, please call our office to schedule the follow-up appointment.  We recommend signing up for the patient portal called "MyChart".  Sign up information is provided on this After Visit Summary.  MyChart is used to connect with patients for Virtual Visits (Telemedicine).  Patients are able to view lab/test results, encounter notes, upcoming appointments, etc.  Non-urgent messages can be sent to your provider as well.   To learn more about what you can do with MyChart, go to [email or web address].    Any Other Special Instructions Will Be Listed Below (If Applicable).

## 2021-05-08 NOTE — Progress Notes (Signed)
PCP: Tammi Sou, MD   Riley Moore is a 56 y.o. male who presents today for routine electrophysiology followup.  Since his recent afib ablation, the patient reports doing very well.  he denies procedure related complications and is pleased with the results of the procedure.  Today, he denies symptoms of palpitations, chest pain, shortness of breath,  lower extremity edema, dizziness, presyncope, or syncope.  The patient is otherwise without complaint today.   Past Medical History:  Diagnosis Date   Anxiety attack    Diabetes mellitus ([place]) 07/2019   prediab 2015.  DM 2 dx 07/2019   Elevated transaminase level    suspect combo of hyperlip/fatty liver + alcohol.  Viral Hep panel neg 2016.   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 Percival Spanish.  Spont conv in ED.  Outpt eval showed nl ECHO--ASA qd and BB/CCB started.  Multiple cardioversions and recurrences, finally got ablation procedure.03/2019--no [place] b/c CHADVASC score low. Recurrence->ablation again 02/2021   Peyronie's disease    Past Surgical History:  Procedure Laterality Date   ATRIAL FIBRILLATION ABLATION N/A 03/31/2019   Procedure: ATRIAL FIBRILLATION ABLATION;  Surgeon: Thompson Grayer, MD;  Location: [place] [facility];  Service: Cardiovascular;  Laterality: N/A;   ATRIAL FIBRILLATION ABLATION N/A 02/06/2021   Procedure: ATRIAL FIBRILLATION ABLATION;  Surgeon: Thompson Grayer, MD;  Location: [place] [facility];  Service: Cardiovascular;  Laterality: N/A;   CARDIOVERSION N/A 06/11/2017   Procedure: CARDIOVERSION;  Surgeon: Skeet Latch, MD;  Location: [place];  Service: Cardiovascular;  Laterality: N/A;    CARDIOVERSION N/A 01/23/2019   Procedure: CARDIOVERSION;  Surgeon: Buford Dresser, MD;  Location: [place];  Service: Cardiovascular;  Laterality: N/A;   CARDIOVERSION N/A 05/24/2020   Procedure: CARDIOVERSION;  Surgeon: Jerline Pain, MD;  Location: [facility];  Service: Cardiovascular;  Laterality: N/A;   DC cardioversion  06/11/2017   was back in A-fib within 5 min of waking up.   TRANSTHORACIC ECHOCARDIOGRAM  04/2012   2013-NORMAL (ED 55-65%).  01/2021 EF 60-65%, valves normal. 12/2020 EF 65-70%, normal.    ROS- all systems are personally reviewed and negatives except as per HPI above  Current Outpatient Medications  Medication Sig Dispense Refill   cetirizine (ZYRTEC) 10 MG tablet Take 10 mg by mouth daily.     clotrimazole-betamethasone (LOTRISONE) cream Apply to affected area bid 30 g 1   diltiazem (CARDIZEM CD) 180 MG 24 hr capsule TAKE 1 CAPSULE BY MOUTH EVERY DAY 90 capsule 3   escitalopram (LEXAPRO) 10 MG tablet Take 1 tablet (10 mg total) by mouth daily. 90 tablet 3   lisinopril (ZESTRIL) 40 MG tablet TAKE 1 TABLET BY MOUTH EVERYDAY AT BEDTIME 90 tablet 3   metFORMIN (GLUCOPHAGE) 500 MG tablet TAKE 2 TABS BY MOUTH EVERY MORNING AND 2 TABS EVERY EVENING . 270 tablet 6   metoprolol tartrate (LOPRESSOR) 25 MG tablet Take 1 tablet (25 mg total) by mouth 2 (two) times daily. 180 tablet 3   rivaroxaban (XARELTO) 20 MG TABS tablet Take 1 tablet (20 mg total) by mouth daily with supper. 90 tablet 3   traMADol (ULTRAM)  50 MG tablet Take 1-2 tablets (50-100 mg total) by mouth every 8 (eight) hours as needed for moderate pain. Maximum 6 tabs per day. 21 tablet 0   No current facility-administered medications for this visit.    Physical Exam: Vitals:   05/08/21 0947  BP: 100/64  Pulse: 65  SpO2: 94%  Weight: 210 lb (95.3 kg)  Height: 6\' 2"  (1.88 m)    GEN- The patient is well appearing, alert and oriented x 3 today.   Head- normocephalic, atraumatic Eyes-  Sclera  clear, conjunctiva pink Ears- hearing intact Oropharynx- clear Lungs- Clear to ausculation bilaterally, normal work of breathing Heart- Regular rate and rhythm, no murmurs, rubs or gallops, PMI not laterally displaced GI- soft, NT, ND, + BS Extremities- no clubbing, cyanosis, or edema  EKG tracing ordered today is personally reviewed and shows sinus  Echo 12/31/20 reviewed  Assessment and Plan:  1. Persistent atrial fibrillation/ atrial flutter Doing well s/p ablation chads2vasc score is 1 stop xarelto We will also wean metoprolol to off  2. HTN Wean metoprolol as above    Return to see me in 4 months  Thompson Grayer MD, [facility] 05/08/2021 10:03 AM

## 2021-05-09 ENCOUNTER — Ambulatory Visit (INDEPENDENT_AMBULATORY_CARE_PROVIDER_SITE_OTHER): Payer: BC Managed Care – PPO | Admitting: Sports Medicine

## 2021-05-09 DIAGNOSIS — S99912D Unspecified injury of left ankle, subsequent encounter: Secondary | ICD-10-CM | POA: Diagnosis not present

## 2021-05-09 NOTE — Progress Notes (Signed)
° ° °  Procedures performed today:    None.  Independent interpretation of notes and tests performed by another provider:   None.  Brief History, Exam, Impression, and Recommendations:    Injury of ankle, left Riley Moore returns, he is a very pleasant 56 year old male, inversion injury approximately 3 weeks ago, on exam at the last visit he had some swelling, pain over the lateral malleolus and a positive anterior drawer sign, x-rays negative for fracture. On exam today still has a minimally positive anterior drawer sign suspicious for ATFL disruption. At this point we can transition into an ASO, he has 1 at home, be very diligent with home conditioning exercises, wear the ASO when out and about, return to see me in 6 weeks.    ___________________________________________ Gwen Her. Dianah Field, M.D., ABFM., CAQSM. Primary Care and [place] Instructor of [place] [facility]

## 2021-05-09 NOTE — Assessment & Plan Note (Signed)
Nick returns, he is a very pleasant 56 year old male, inversion injury approximately 3 weeks ago, on exam at the last visit he had some swelling, pain over the lateral malleolus and a positive anterior drawer sign, x-rays negative for fracture. On exam today still has a minimally positive anterior drawer sign suspicious for ATFL disruption. At this point we can transition into an ASO, he has 1 at home, be very diligent with home conditioning exercises, wear the ASO when out and about, return to see me in 6 weeks.

## 2021-05-26 DIAGNOSIS — T23262A Burn of second degree of back of left hand, initial encounter: Secondary | ICD-10-CM | POA: Diagnosis not present

## 2021-06-13 ENCOUNTER — Ambulatory Visit (AMBULATORY_SURGERY_CENTER): Payer: BC Managed Care – PPO | Admitting: *Deleted

## 2021-06-13 ENCOUNTER — Other Ambulatory Visit: Payer: Self-pay

## 2021-06-13 VITALS — Ht 74.0 in | Wt 210.0 lb

## 2021-06-13 DIAGNOSIS — Z1211 Encounter for screening for malignant neoplasm of colon: Secondary | ICD-10-CM

## 2021-06-13 MED ORDER — NA SULFATE-K SULFATE-MG SULF 17.5-3.13-1.6 GM/177ML PO SOLN: 1.0000 | ORAL | 0 refills | Status: DC

## 2021-06-13 NOTE — Progress Notes (Signed)

## 2021-06-17 ENCOUNTER — Other Ambulatory Visit: Payer: Self-pay | Admitting: Internal Medicine

## 2021-06-20 ENCOUNTER — Ambulatory Visit: Payer: BC Managed Care – PPO | Admitting: Sports Medicine

## 2021-06-21 ENCOUNTER — Other Ambulatory Visit: Payer: Self-pay | Admitting: Family Medicine

## 2021-06-23 ENCOUNTER — Encounter: Payer: Self-pay | Admitting: Gastroenterology

## 2021-06-25 ENCOUNTER — Encounter: Payer: Self-pay | Admitting: Gastroenterology

## 2021-06-27 ENCOUNTER — Ambulatory Visit (AMBULATORY_SURGERY_CENTER): Payer: BC Managed Care – PPO | Admitting: Gastroenterology

## 2021-06-27 ENCOUNTER — Other Ambulatory Visit: Payer: Self-pay

## 2021-06-27 ENCOUNTER — Encounter: Payer: Self-pay | Admitting: Gastroenterology

## 2021-06-27 VITALS — BP 120/82 | HR 56 | Temp 97.2°F | Resp 13 | Ht 74.0 in | Wt 210.0 lb

## 2021-06-27 DIAGNOSIS — Z1211 Encounter for screening for malignant neoplasm of colon: Secondary | ICD-10-CM

## 2021-06-27 DIAGNOSIS — D129 Benign neoplasm of anus and anal canal: Secondary | ICD-10-CM

## 2021-06-27 DIAGNOSIS — D128 Benign neoplasm of rectum: Secondary | ICD-10-CM | POA: Diagnosis not present

## 2021-06-27 DIAGNOSIS — D124 Benign neoplasm of descending colon: Secondary | ICD-10-CM | POA: Diagnosis not present

## 2021-06-27 HISTORY — PX: COLONOSCOPY: SHX174

## 2021-06-27 MED ORDER — SODIUM CHLORIDE 0.9 % IV SOLN: 500.0000 mL | INTRAVENOUS | Status: DC

## 2021-06-27 NOTE — Progress Notes (Signed)
A and O x3. Report to RN. Tolerated MAC anesthesia well. 

## 2021-06-27 NOTE — Op Note (Signed)
[place] Patient Name: Riley Moore Procedure Date: 06/27/2021 8:22 AM MRN: [id number] Endoscopist: Nicki Reaper E. Candis Schatz , MD Age: 56 Referring MD:  Date of Birth: 01/03/1966 Gender: Male Account #: [id number] Procedure:                Colonoscopy Indications:              Screening for colorectal malignant neoplasm, This                            is the patient's first colonoscopy Medicines:                Monitored Anesthesia Care Procedure:                Pre-Anesthesia Assessment:                           - Prior to the procedure, a History and Physical                            was performed, and patient medications and                            allergies were reviewed. The patient's tolerance of                            previous anesthesia was also reviewed. The risks                            and benefits of the procedure and the sedation                            options and risks were discussed with the patient.                            All questions were answered, and informed consent                            was obtained. Prior Anticoagulants: The patient has                            taken no previous anticoagulant or antiplatelet                            agents. ASA Grade Assessment: III - A patient with                            severe systemic disease. After reviewing the risks                            and benefits, the patient was deemed in                            satisfactory condition to undergo the procedure.  After obtaining informed consent, the colonoscope                            was passed under direct vision. Throughout the                            procedure, the patient's blood pressure, pulse, and                            oxygen saturations were monitored continuously. The                            Olympus CF-HQ190L [id number]) Colonoscope was                            introduced through the anus  and advanced to the the                            terminal ileum, with identification of the                            appendiceal orifice and IC valve. The quality of                            the bowel preparation was good. The terminal ileum,                            ileocecal valve, appendiceal orifice, and rectum                            were photographed. The bowel preparation used was                            SUPREP via split dose instruction. Scope In: 8:33:01 AM Scope Out: 8:58:41 AM Scope Withdrawal Time: 0 hours 16 minutes 32 seconds  Total Procedure Duration: 0 hours 25 minutes 40 seconds  Findings:                 The perianal and digital rectal examinations were                            normal. Pertinent negatives include normal                            sphincter tone and no palpable rectal lesions.                           A 18 mm polyp was found in the rectum. The polyp                            was pedunculated. The polyp was removed with a hot                            snare. Resection and retrieval  were complete.                            Estimated blood loss: none.                           A 7 mm polyp was found in the descending colon. The                            polyp was sessile. The polyp was removed with a                            cold snare. Resection and retrieval were complete.                            Estimated blood loss was minimal.                           The exam was otherwise normal throughout the                            examined colon.                           The terminal ileum appeared normal.                           Non-bleeding internal hemorrhoids were found during                            retroflexion. The hemorrhoids were Grade I                            (internal hemorrhoids that do not prolapse).                           No additional abnormalities were found on                             retroflexion. Complications:            No immediate complications. Estimated Blood Loss:     Estimated blood loss was minimal. Impression:               - One 18 mm polyp in the rectum, removed with a hot                            snare. Resected and retrieved.                           - One 7 mm polyp in the descending colon, removed                            with a cold snare. Resected and retrieved.                           -  The examined portion of the ileum was normal.                           - Non-bleeding internal hemorrhoids. Recommendation:           - Patient has a contact number available for                            emergencies. The signs and symptoms of potential                            delayed complications were discussed with the                            patient. Return to normal activities tomorrow.                            Written discharge instructions were provided to the                            patient.                           - Resume previous diet.                           - Continue present medications.                           - Await pathology results.                           - Repeat colonoscopy (date not yet determined) for                            surveillance based on pathology results. [place] Latulippe E. Candis Schatz, MD 06/27/2021 9:04:07 AM This report has been signed electronically.

## 2021-06-27 NOTE — Patient Instructions (Signed)
Resume previous diet.  Continue present medications.   2 polyps removed; awaiting pathology.   Handouts given about polyps and hemorrhoids.  YOU HAD AN ENDOSCOPIC PROCEDURE TODAY AT [facility]:   Refer to the procedure report that was given to you for any specific questions about what was found during the examination.  If the procedure report does not answer your questions, please call your gastroenterologist to clarify.  If you requested that your care partner not be given the details of your procedure findings, then the procedure report has been included in a sealed envelope for you to review at your convenience later.  YOU SHOULD EXPECT: Some feelings of bloating in the abdomen. Passage of more gas than usual.  Walking can help get rid of the air that was put into your GI tract during the procedure and reduce the bloating. If you had a lower endoscopy (such as a colonoscopy or flexible sigmoidoscopy) you may notice spotting of blood in your stool or on the toilet paper. If you underwent a bowel prep for your procedure, you may not have a normal bowel movement for a few days.  Please Note:  You might notice some irritation and congestion in your nose or some drainage.  This is from the oxygen used during your procedure.  There is no need for concern and it should clear up in a day or so.  SYMPTOMS TO REPORT IMMEDIATELY:  Following lower endoscopy (colonoscopy or flexible sigmoidoscopy):  Excessive amounts of blood in the stool  Significant tenderness or worsening of abdominal pains  Swelling of the abdomen that is new, acute  Fever of 100F or higher   For urgent or emergent issues, a gastroenterologist can be reached at any hour by calling [phone]. Do not use MyChart messaging for urgent concerns.    DIET:  We do recommend a small meal at first, but then you may proceed to your regular diet.  Drink plenty of fluids but you should avoid alcoholic beverages for 24  hours.  ACTIVITY:  You should plan to take it easy for the rest of today and you should NOT DRIVE or use heavy machinery until tomorrow (because of the sedation medicines used during the test).    FOLLOW UP: Our staff will call the number listed on your records 48-72 hours following your procedure to check on you and address any questions or concerns that you may have regarding the information given to you following your procedure. If we do not reach you, we will leave a message.  We will attempt to reach you two times.  During this call, we will ask if you have developed any symptoms of COVID 19. If you develop any symptoms (ie: fever, flu-like symptoms, shortness of breath, cough etc.) before then, please call [phone].  If you test positive for Covid 19 in the 2 weeks post procedure, please call and report this information to [place].    If any biopsies were taken you will be contacted by phone or by letter within the next 1-3 weeks.  Please call us at [phone] if you have not heard about the biopsies in 3 weeks.    SIGNATURES/CONFIDENTIALITY: You and/or your care partner have signed paperwork which will be entered into your electronic medical record.  These signatures attest to the fact that that the information above on your After Visit Summary has been reviewed and is understood.  Full responsibility of the confidentiality of this discharge information lies with  you and/or your care-partner.  °

## 2021-06-27 NOTE — Progress Notes (Signed)
[facility] History and Physical   Primary Care Physician:  Tammi Sou, MD   Reason for Procedure:   Colon cancer screening   Plan:    Screening colonoscopy     HPI: Riley Moore is a 56 y.o. male undergoing initial average risk screening colonoscopy.  He has no family history of colon cancer and no chronic GI symptoms.    Past Medical History:  Diagnosis Date   Anxiety attack    Diabetes mellitus ([place]) 07/2019   prediab 2015.  DM 2 dx 07/2019   Elevated transaminase level    suspect combo of hyperlip/fatty liver + alcohol.  Viral Hep panel neg 2016.   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 Percival Spanish.  Spont conv in ED.  Outpt eval showed nl ECHO--ASA qd and BB/CCB started.  Multiple cardioversions and recurrences, finally got ablation procedure.03/2019--no [place] b/c CHADVASC score low. Recurrence->ablation again 02/2021   Peyronie's disease     Past Surgical History:  Procedure Laterality Date   ATRIAL FIBRILLATION ABLATION N/A 03/31/2019   Procedure: ATRIAL FIBRILLATION ABLATION;  Surgeon: Thompson Grayer, MD;  Location: [place] [facility];  Service: Cardiovascular;  Laterality: N/A;   ATRIAL FIBRILLATION ABLATION N/A 02/06/2021   Procedure: ATRIAL FIBRILLATION ABLATION;  Surgeon: Thompson Grayer, MD;  Location: [place] [facility];  Service: Cardiovascular;  Laterality: N/A;   CARDIOVERSION N/A 06/11/2017   Procedure: CARDIOVERSION;  Surgeon: Skeet Latch, MD;  Location: [place];  Service: Cardiovascular;  Laterality: N/A;   CARDIOVERSION N/A 01/23/2019   Procedure: CARDIOVERSION;  Surgeon: Buford Dresser, MD;  Location: [place];   Service: Cardiovascular;  Laterality: N/A;   CARDIOVERSION N/A 05/24/2020   Procedure: CARDIOVERSION;  Surgeon: Jerline Pain, MD;  Location: [facility];  Service: Cardiovascular;  Laterality: N/A;   DC cardioversion  06/11/2017   was back in A-fib within 5 min of waking up.   TRANSTHORACIC ECHOCARDIOGRAM  04/2012   2013-NORMAL (ED 55-65%).  01/2021 EF 60-65%, valves normal. 12/2020 EF 65-70%, normal.    Prior to Admission medications   Medication Sig Start Date End Date Taking? Authorizing Provider  cetirizine (ZYRTEC) 10 MG tablet Take 10 mg by mouth daily.   Yes [provider]  clotrimazole-betamethasone (LOTRISONE) cream APPLY TO AFFECTED AREA TWICE A DAY 06/23/21  Yes McGowen, Adrian Blackwater, MD  diltiazem (CARDIZEM CD) 180 MG 24 hr capsule TAKE 1 CAPSULE BY MOUTH EVERY DAY 09/03/20  Yes Minus Breeding, MD  escitalopram (LEXAPRO) 10 MG tablet Take 1 tablet (10 mg total) by mouth daily. 12/06/20  Yes McGowen, Adrian Blackwater, MD  lisinopril (ZESTRIL) 40 MG tablet TAKE 1 TABLET BY MOUTH EVERYDAY AT BEDTIME 12/06/20  Yes McGowen, Adrian Blackwater, MD  metFORMIN (GLUCOPHAGE) 500 MG tablet TAKE 2 TABS BY MOUTH EVERY MORNING AND 2 TABS EVERY EVENING . 12/09/20  Yes McGowen, Adrian Blackwater, MD  metoprolol tartrate (LOPRESSOR) 25 MG tablet Take 0.5 tablets (12.5 mg total) by mouth 2 (two) times daily. Reduce Metoprolol tartrate to 12.5 mg two times daily for 6 weeks then stop 05/08/21 06/27/21 Yes Allred, Jeneen Rinks, MD  traMADol (ULTRAM) 50 MG tablet Take 1-2  tablets (50-100 mg total) by mouth every 8 (eight) hours as needed for moderate pain. Maximum 6 tabs per day. 04/25/21   Silverio Decamp, MD    Current Outpatient Medications  Medication Sig Dispense Refill   cetirizine (ZYRTEC) 10 MG tablet Take 10 mg by mouth daily.     clotrimazole-betamethasone (LOTRISONE) cream APPLY TO AFFECTED AREA TWICE A DAY 30 g 1   diltiazem (CARDIZEM CD) 180 MG 24 hr capsule TAKE 1 CAPSULE BY MOUTH EVERY DAY 90 capsule 3    escitalopram (LEXAPRO) 10 MG tablet Take 1 tablet (10 mg total) by mouth daily. 90 tablet 3   lisinopril (ZESTRIL) 40 MG tablet TAKE 1 TABLET BY MOUTH EVERYDAY AT BEDTIME 90 tablet 3   metFORMIN (GLUCOPHAGE) 500 MG tablet TAKE 2 TABS BY MOUTH EVERY MORNING AND 2 TABS EVERY EVENING . 270 tablet 6   metoprolol tartrate (LOPRESSOR) 25 MG tablet Take 0.5 tablets (12.5 mg total) by mouth 2 (two) times daily. Reduce Metoprolol tartrate to 12.5 mg two times daily for 6 weeks then stop 42 tablet 0   traMADol (ULTRAM) 50 MG tablet Take 1-2 tablets (50-100 mg total) by mouth every 8 (eight) hours as needed for moderate pain. Maximum 6 tabs per day. 21 tablet 0   Current Facility-Administered Medications  Medication Dose Route Frequency Provider Last Rate Last Admin   0.9 %  sodium chloride infusion  500 mL Intravenous Continuous Daryel November, MD        Allergies as of 06/27/2021 - Review Complete 06/27/2021  Allergen Reaction Noted   Pravastatin  12/19/2019    Family History  Problem Relation Age of Onset   Heart disease Father        pacemaker, atrial fibrillation   Hypertension Father    Colon cancer Neg Hx    Colon polyps Neg Hx    Esophageal cancer Neg Hx    Stomach cancer Neg Hx    Rectal cancer Neg Hx     Social History   Socioeconomic History   Marital status: Married    Spouse name: Not on file   Number of children: 2   Years of education: Not on file   Highest education level: Not on file  Occupational History   Occupation: Chief Financial Officer  Tobacco Use   Smoking status: Never   Smokeless tobacco: Never  Vaping Use   Vaping Use: Never used  Substance and Sexual Activity   Alcohol use: Not Currently    Alcohol/week: 4.0 - 5.0 standard drinks    Types: 4 - 5 Cans of beer per week    Comment: daily   Drug use: No   Sexual activity: Not on file  Other Topics Concern   Not on file  Social History Narrative   Lives at home with wife and two children.      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  Alc: 4 beers per day.  Denies hx of alc problems/abuse.  However, he says he tried quitting in the past and after a few days he started to have "panic attacks".         Social Determinants of Health   Financial Resource Strain: Not on file  Food Insecurity: Not on file  Transportation Needs: Not on file  Physical Activity: Not on file  Stress: Not on file  Social Connections: Not on file  Intimate Partner Violence: Not on file    Review of Systems:  All other review of systems negative except as mentioned in the HPI.  Physical Exam: Vital signs BP (!) 152/86    Pulse 65    Temp (!) 97.2 F (36.2 C)    Resp 12    Ht 6\' 2"  (1.88 m)    Wt 210 lb (95.3 kg)    SpO2 98%    BMI 26.96 kg/m   General:   Alert,  Well-developed, well-nourished, pleasant and cooperative in NAD Airway:  Mallampati 2 Lungs:  Clear throughout to auscultation.   Heart:  Regular rate and rhythm; no murmurs, clicks, rubs,  or gallops. Abdomen:  Soft, nontender and nondistended. Normal bowel sounds.   Neuro/Psych:  Normal mood and affect. A and O x 3   Cherylee Rawlinson E. Candis Schatz, MD [facility]

## 2021-06-27 NOTE — Progress Notes (Signed)
Called to room to assist during endoscopic procedure.  Patient ID and intended procedure confirmed with present staff. Received instructions for my participation in the procedure from the performing physician.  

## 2021-06-27 NOTE — Progress Notes (Signed)
Pt's states no medical or surgical changes since previsit or office visit. 

## 2021-07-01 ENCOUNTER — Telehealth: Payer: Self-pay | Admitting: *Deleted

## 2021-07-01 NOTE — Telephone Encounter (Signed)
°  Follow up Call-  Call back number 06/27/2021  Post procedure Call Back phone  # [phone]  Permission to leave phone message Yes  Some recent data might be hidden     Patient questions:  Do you have a fever, pain , or abdominal swelling? No. Pain Score  0 *  Have you tolerated food without any problems? Yes.    Have you been able to return to your normal activities? Yes.    Do you have any questions about your discharge instructions: Diet   No. Medications  No. Follow up visit  No.  Do you have questions or concerns about your Care? No.  Actions: * If pain score is 4 or above: No action needed, pain <4.

## 2021-07-03 NOTE — Progress Notes (Signed)
Riley Moore,   The two polyps that I removed during your recent procedure were completely benign but were proven to be "pre-cancerous" polyps that MAY have grown into cancers if they had not been removed.  Studies shows that at least 20% of women over age 57 and 30% of men over age 58 have pre-cancerous polyps.  Based on current nationally recognized surveillance guidelines, I recommend that you have a repeat colonoscopy in 3 years.   If you develop any new rectal bleeding, abdominal pain or significant bowel habit changes, please contact me before then.

## 2021-07-14 ENCOUNTER — Other Ambulatory Visit: Payer: Self-pay | Admitting: Cardiology

## 2021-08-30 ENCOUNTER — Other Ambulatory Visit: Payer: Self-pay | Admitting: Family Medicine

## 2021-09-17 ENCOUNTER — Other Ambulatory Visit: Payer: Self-pay | Admitting: Family Medicine

## 2021-11-20 DIAGNOSIS — Z713 Dietary counseling and surveillance: Secondary | ICD-10-CM | POA: Diagnosis not present

## 2021-12-01 ENCOUNTER — Other Ambulatory Visit: Payer: Self-pay | Admitting: Family Medicine

## 2021-12-24 ENCOUNTER — Other Ambulatory Visit: Payer: Self-pay | Admitting: Family Medicine

## 2021-12-25 DIAGNOSIS — Z713 Dietary counseling and surveillance: Secondary | ICD-10-CM | POA: Diagnosis not present

## 2021-12-28 ENCOUNTER — Other Ambulatory Visit: Payer: Self-pay | Admitting: Family Medicine

## 2021-12-29 NOTE — Telephone Encounter (Signed)
Pt was sent mychart message regarding scheduling an appointment (follow up or physical) on 8/24. Patient was given 30 day supply at that time.

## 2022-01-07 ENCOUNTER — Telehealth: Payer: Self-pay | Admitting: Family Medicine

## 2022-01-07 MED ORDER — ESCITALOPRAM OXALATE 10 MG PO TABS: 10.0000 mg | ORAL_TABLET | Freq: Every day | ORAL | 0 refills | Status: DC

## 2022-01-07 NOTE — Telephone Encounter (Signed)
Pt is scheduled for 01/09/22

## 2022-01-07 NOTE — Telephone Encounter (Signed)
Pt is requesting refill for Metoprolol and Lexapro. He says he is completely out and cant miss a dose.

## 2022-01-07 NOTE — Telephone Encounter (Signed)
Metoprolol is refilled by cardiology, Dr.Allred. Last refill completed 05/08/21 #42 with directions to stop after 6 weeks (06/27/21). Patient advised of this and rx sent for Lexapro.

## 2022-01-09 ENCOUNTER — Ambulatory Visit: Payer: BC Managed Care – PPO | Admitting: Family Medicine

## 2022-01-09 ENCOUNTER — Encounter: Payer: Self-pay | Admitting: Family Medicine

## 2022-01-09 VITALS — BP 118/82 | HR 65 | Temp 97.7°F | Ht 74.0 in | Wt 215.0 lb

## 2022-01-09 DIAGNOSIS — Z125 Encounter for screening for malignant neoplasm of prostate: Secondary | ICD-10-CM

## 2022-01-09 DIAGNOSIS — Z0000 Encounter for general adult medical examination without abnormal findings: Secondary | ICD-10-CM | POA: Diagnosis not present

## 2022-01-09 DIAGNOSIS — E7800 Pure hypercholesterolemia, unspecified: Secondary | ICD-10-CM

## 2022-01-09 DIAGNOSIS — E119 Type 2 diabetes mellitus without complications: Secondary | ICD-10-CM | POA: Diagnosis not present

## 2022-01-09 DIAGNOSIS — Z23 Encounter for immunization: Secondary | ICD-10-CM | POA: Diagnosis not present

## 2022-01-09 DIAGNOSIS — I10 Essential (primary) hypertension: Secondary | ICD-10-CM | POA: Diagnosis not present

## 2022-01-09 LAB — POCT GLYCOSYLATED HEMOGLOBIN (HGB A1C)
HbA1c POC (<> result, manual entry): 5.3 % (ref 4.0–5.6)
HbA1c, POC (controlled diabetic range): 5.3 % (ref 0.0–7.0)
HbA1c, POC (prediabetic range): 5.3 % — AB (ref 5.7–6.4)
Hemoglobin A1C: 5.3 % (ref 4.0–5.6)

## 2022-01-09 MED ORDER — CLOTRIMAZOLE-BETAMETHASONE 1-0.05 % EX CREA
TOPICAL_CREAM | CUTANEOUS | 1 refills | Status: DC
Start: 2022-01-09 — End: 2022-10-06

## 2022-01-09 MED ORDER — ESCITALOPRAM OXALATE 10 MG PO TABS: 10.0000 mg | ORAL_TABLET | Freq: Every day | ORAL | 1 refills | Status: DC

## 2022-01-09 MED ORDER — LISINOPRIL 40 MG PO TABS: ORAL_TABLET | ORAL | 1 refills | Status: DC

## 2022-01-09 MED ORDER — METFORMIN HCL 500 MG PO TABS
ORAL_TABLET | ORAL | 1 refills | Status: DC
Start: 2022-01-09 — End: 2022-02-27

## 2022-01-09 NOTE — Patient Instructions (Signed)
Health Maintenance, Male Adopting a healthy lifestyle and getting preventive care are important in promoting health and wellness. Ask your health care provider about: The right schedule for you to have regular tests and exams. Things you can do on your own to prevent diseases and keep yourself healthy. What should I know about diet, weight, and exercise? Eat a healthy diet  Eat a diet that includes plenty of vegetables, fruits, low-fat dairy products, and lean protein. Do not eat a lot of foods that are high in solid fats, added sugars, or sodium. Maintain a healthy weight Body mass index (BMI) is a measurement that can be used to identify possible weight problems. It estimates body fat based on height and weight. Your health care provider can help determine your BMI and help you achieve or maintain a healthy weight. Get regular exercise Get regular exercise. This is one of the most important things you can do for your health. Most adults should: Exercise for at least 150 minutes each week. The exercise should increase your heart rate and make you sweat (moderate-intensity exercise). Do strengthening exercises at least twice a week. This is in addition to the moderate-intensity exercise. Spend less time sitting. Even light physical activity can be beneficial. Watch cholesterol and blood lipids Have your blood tested for lipids and cholesterol at 57 years of age, then have this test every 5 years. You may need to have your cholesterol levels checked more often if: Your lipid or cholesterol levels are high. You are older than 57 years of age. You are at high risk for heart disease. What should I know about cancer screening? Many types of cancers can be detected early and may often be prevented. Depending on your health history and family history, you may need to have cancer screening at various ages. This may include screening for: Colorectal cancer. Prostate cancer. Skin cancer. Lung  cancer. What should I know about heart disease, diabetes, and high blood pressure? Blood pressure and heart disease High blood pressure causes heart disease and increases the risk of stroke. This is more likely to develop in people who have high blood pressure readings or are overweight. Talk with your health care provider about your target blood pressure readings. Have your blood pressure checked: Every 3-5 years if you are 18-39 years of age. Every year if you are 40 years old or older. If you are between the ages of 65 and 75 and are a current or former smoker, ask your health care provider if you should have a one-time screening for abdominal aortic aneurysm (AAA). Diabetes Have regular diabetes screenings. This checks your fasting blood sugar level. Have the screening done: Once every three years after age 45 if you are at a normal weight and have a low risk for diabetes. More often and at a younger age if you are overweight or have a high risk for diabetes. What should I know about preventing infection? Hepatitis B If you have a higher risk for hepatitis B, you should be screened for this virus. Talk with your health care provider to find out if you are at risk for hepatitis B infection. Hepatitis C Blood testing is recommended for: Everyone born from 1945 through 1965. Anyone with known risk factors for hepatitis C. Sexually transmitted infections (STIs) You should be screened each year for STIs, including gonorrhea and chlamydia, if: You are sexually active and are younger than 57 years of age. You are older than 57 years of age and your   health care provider tells you that you are at risk for this type of infection. Your sexual activity has changed since you were last screened, and you are at increased risk for chlamydia or gonorrhea. Ask your health care provider if you are at risk. Ask your health care provider about whether you are at high risk for HIV. Your health care provider  may recommend a prescription medicine to help prevent HIV infection. If you choose to take medicine to prevent HIV, you should first get tested for HIV. You should then be tested every 3 months for as long as you are taking the medicine. Follow these instructions at home: Alcohol use Do not drink alcohol if your health care provider tells you not to drink. If you drink alcohol: Limit how much you have to 0-2 drinks a day. Know how much alcohol is in your drink. In the [place] one drink equals one 12 oz bottle of beer (355 mL), one 5 oz glass of wine (148 mL), or one 1 oz glass of hard liquor (44 mL). Lifestyle Do not use any products that contain nicotine or tobacco. These products include cigarettes, chewing tobacco, and vaping devices, such as e-cigarettes. If you need help quitting, ask your health care provider. Do not use street drugs. Do not share needles. Ask your health care provider for help if you need support or information about quitting drugs. General instructions Schedule regular health, dental, and eye exams. Stay current with your vaccines. Tell your health care provider if: You often feel depressed. You have ever been abused or do not feel safe at home. Summary Adopting a healthy lifestyle and getting preventive care are important in promoting health and wellness. Follow your health care provider's instructions about healthy diet, exercising, and getting tested or screened for diseases. Follow your health care provider's instructions on monitoring your cholesterol and blood pressure. This information is not intended to replace advice given to you by your health care provider. Make sure you discuss any questions you have with your health care provider. Document Revised: 09/09/2020 Document Reviewed: 09/09/2020 Elsevier Patient Education  2023 Elsevier Inc.  

## 2022-01-09 NOTE — Progress Notes (Signed)
Office Note 01/09/2022  CC:  Chief Complaint  Patient presents with   Diabetes   Hypertension   Hyperlipidemia    Pt is not fasting    HPI:  Patient is a 56 y.o. male who is here for annual health maintenance exam and 1 year follow-up diabetes, hypertension, hyperlipidemia. A/P as of last visit: "1) Persistent a-fib: followed by EP, Dr. Rayann Heman, ablation to be scheduled soon--pt has f/u next week.  I encouraged him to go ahead and restart xarelto. Cont cardizem CD 180 qd and lopressor 25 bid. Lytes/cr, TSH, cbc ordered.   2) DM 2: taking '500mg'$  metformin qAM and 1000 qPM. Hba1c, urine microalb/cr, and lytes/cr ordered.   3) HLD: not fasting today.  Intol of at least 2 statins and he declines trial of any further statins.  He'll try to get back in with dr. Debara Pickett in future.  ?PCSK9-I trial?   4) HTN: well controlled on lopressor '25mg'$  bid, cardizem cd 180 qd, lisinopril 40 qd. Lytes/cr ordered.   5) Health maintenance exam: Reviewed age and gender appropriate health maintenance issues (prudent diet, regular exercise, health risks of tobacco and excessive alcohol, use of seatbelts, fire alarms in home, use of sunscreen).  Also reviewed age and gender appropriate health screening as well as vaccine recommendations. Vaccines: Prevnar 20->given today.  Shingrix #2->given today.  Otherwise UTD. Labs: HP, Hba1c, PSA, and urine microalb/cr ordered. Prostate ca screening:  PSA ordered. Colon ca screening: GI referral ordered."  INTERIM HX: He has been feeling well. He got A-fib ablation last year and has held in sinus rhythm. He was recently taken off his metoprolol because of some low blood pressures.   He is taking his metformin as recommended. Diet is pretty healthy and low-carb, goes to the gym regularly.  History of intolerance of statins. Rosuvastatin was recommended when he last saw Dr. Debara Pickett back in August 2021.  Patient does not recall ever taking that medication.  Dr. Debara [place]ett  mentioned in his note that if he did not tolerate it then they could consider other options.  Past Medical History:  Diagnosis Date   Anxiety attack    Diabetes mellitus ([place]) 07/2019   prediab 2015.  DM 2 dx 07/2019   Elevated transaminase level    suspect combo of hyperlip/fatty liver + alcohol.  Viral Hep panel neg 2016.   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 Percival Spanish.  Spont conv in ED.  Outpt eval showed nl ECHO--ASA qd and BB/CCB started.  Multiple cardioversions and recurrences, finally got ablation procedure.03/2019--no [place] b/c CHADVASC score low. Recurrence->ablation again 02/2021   Peyronie's disease     Past Surgical History:  Procedure Laterality Date   ATRIAL FIBRILLATION ABLATION N/A 03/31/2019   Procedure: ATRIAL FIBRILLATION ABLATION;  Surgeon: Thompson Grayer, MD;  Location: [place] [facility];  Service: Cardiovascular;  Laterality: N/A;   ATRIAL FIBRILLATION ABLATION N/A 02/06/2021   Procedure: ATRIAL FIBRILLATION ABLATION;  Surgeon: Thompson Grayer, MD;  Location: [place] [facility];  Service: Cardiovascular;  Laterality: N/A;   CARDIOVERSION N/A 06/11/2017   Procedure: CARDIOVERSION;  Surgeon: Skeet Latch, MD;  Location: [place];  Service: Cardiovascular;  Laterality: N/A;   CARDIOVERSION N/A 01/23/2019   Procedure: CARDIOVERSION;  Surgeon: Buford Dresser, MD;  Location: [place];  Service: Cardiovascular;  Laterality: N/A;   CARDIOVERSION N/A 05/24/2020   Procedure: CARDIOVERSION;  Surgeon: Jerline Pain, MD;  Location: [facility];  Service: Cardiovascular;  Laterality: N/A;   COLONOSCOPY  06/27/2021   06/2021->2 adenomas->recall 3 yrs  per GI MD   DC cardioversion  06/11/2017   was back in A-fib within 5 min of waking up.   TRANSTHORACIC ECHOCARDIOGRAM  04/2012   2013-NORMAL (ED 55-65%).  01/2021 EF 60-65%, valves normal. 12/2020 EF 65-70%, normal.    Family History  Problem Relation Age of Onset   Heart disease Father        pacemaker, atrial fibrillation   Hypertension Father    Colon cancer Neg Hx    Colon polyps Neg Hx    Esophageal cancer Neg Hx    Stomach cancer Neg Hx    Rectal cancer Neg Hx     Social History   Socioeconomic History   Marital status: Married    Spouse name: Not on file   Number of children: 2   Years of education: Not on file   Highest education level: Not on file  Occupational History   Occupation: Chief Financial Officer  Tobacco Use   Smoking status: Never   Smokeless tobacco: Never  Vaping Use   Vaping Use: Never used  Substance and Sexual Activity   Alcohol use: Not Currently    Alcohol/week: 4.0 - 5.0 standard drinks of alcohol    Types: 4 - 5 Cans of beer per week    Comment: daily   Drug use: No   Sexual activity: Not on file  Other Topics Concern   Not on file  Social History Narrative   Lives at home with wife and two children.     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  Alc: 4 beers per day.  Denies hx of alc problems/abuse.  However, he says he tried quitting in the past and after a few days he started to have "panic attacks".         Social Determinants of Health   Financial Resource Strain: Not on file  Food Insecurity: Not on file  Transportation Needs: Not on file  Physical Activity: Not on file  Stress: Not on file  Social Connections: Not on file  Intimate Partner Violence: Not on file    Outpatient Medications Prior to Visit  Medication Sig Dispense Refill   cetirizine (ZYRTEC) 10 MG tablet Take 10 mg by mouth daily.     diltiazem (CARDIZEM CD) 180 MG 24 hr capsule TAKE 1 CAPSULE BY MOUTH EVERY DAY  90 capsule 3   clotrimazole-betamethasone (LOTRISONE) cream APPLY TO AFFECTED AREA TWICE A DAY 30 g 1   escitalopram (LEXAPRO) 10 MG tablet Take 1 tablet (10 mg total) by mouth daily. OFFICE VISIT NEEDED FOR FURTHER REFILLS 30 tablet 0   metFORMIN (GLUCOPHAGE) 500 MG tablet TAKE 2 TABS BY MOUTH EVERY MORNING AND 2 TABS EVERY EVENING . 270 tablet 6   traMADol (ULTRAM) 50 MG tablet Take 1-2 tablets (50-100 mg total) by mouth every 8 (eight) hours as needed for moderate pain. Maximum 6 tabs per day. 21 tablet 0   lisinopril (ZESTRIL) 40 MG tablet TAKE 1 TABLET BY MOUTH EVERYDAY AT BEDTIME 30 tablet 0   metoprolol tartrate (LOPRESSOR) 25 MG tablet Take  0.5 tablets (12.5 mg total) by mouth 2 (two) times daily. Reduce Metoprolol tartrate to 12.5 mg two times daily for 6 weeks then stop (Patient not taking: Reported on 01/09/2022) 42 tablet 0   No facility-administered medications prior to visit.    Allergies  Allergen Reactions   Pravastatin     Decreased energy, "wiped out"    ROS Review of Systems  Constitutional:  Negative for appetite change, chills, fatigue and fever.  HENT:  Negative for congestion, dental problem, ear pain and sore throat.   Eyes:  Negative for discharge, redness and visual disturbance.  Respiratory:  Negative for cough, chest tightness, shortness of breath and wheezing.   Cardiovascular:  Negative for chest pain, palpitations and leg swelling.  Gastrointestinal:  Negative for abdominal pain, blood in stool, diarrhea, nausea and vomiting.  Genitourinary:  Negative for difficulty urinating, dysuria, flank pain, frequency, hematuria and urgency.  Musculoskeletal:  Negative for arthralgias, back pain, joint swelling, myalgias and neck stiffness.  Skin:  Negative for pallor and rash.  Neurological:  Negative for dizziness, speech difficulty, weakness and headaches.  Hematological:  Negative for adenopathy. Does not bruise/bleed easily.  Psychiatric/Behavioral:  Negative for  confusion and sleep disturbance. The patient is not nervous/anxious.     PE;    01/09/2022   11:06 AM 06/27/2021    9:22 AM 06/27/2021    9:12 AM  Vitals with BMI  Height '6\' 2"'$     Weight 215 lbs    BMI 36.64    Systolic 403 474 259  Diastolic 82 82 78  Pulse 65 56 55     Gen: Alert, well appearing.  Patient is oriented to person, place, time, and situation. AFFECT: pleasant, lucid thought and speech. ENT: Ears: EACs clear, normal epithelium.  TMs with good light reflex and landmarks bilaterally.  Eyes: no injection, icteris, swelling, or exudate.  EOMI, PERRLA. Nose: no drainage or turbinate edema/swelling.  No injection or focal lesion.  Mouth: lips without lesion/swelling.  Oral mucosa pink and moist.  Dentition intact and without obvious caries or gingival swelling.  Oropharynx without erythema, exudate, or swelling.  Neck: supple/nontender.  No LAD, mass, or TM.  Carotid pulses 2+ bilaterally, without bruits. CV: RRR, no m/r/g.   LUNGS: CTA bilat, nonlabored resps, good aeration in all lung fields. ABD: soft, NT, ND, BS normal.  No hepatospenomegaly or mass.  No bruits. EXT: no clubbing, cyanosis, or edema.  Musculoskeletal: no joint swelling, erythema, warmth, or tenderness.  ROM of all joints intact. Skin - no sores or suspicious lesions or rashes or color changes  Pertinent labs:  Lab Results  Component Value Date   TSH 2.04 07/17/2019   Lab Results  Component Value Date   WBC 7.7 01/17/2021   HGB 14.5 01/17/2021   HCT 42.0 01/17/2021   MCV 93 01/17/2021   PLT 161 01/17/2021   Lab Results  Component Value Date   CREATININE 0.77 01/17/2021   BUN 15 01/17/2021   NA 138 01/17/2021   K 4.5 01/17/2021   CL 100 01/17/2021   CO2 23 01/17/2021   Lab Results  Component Value Date   ALT 128 (H) 12/06/2020   AST 91 (H) 12/06/2020   ALKPHOS 58 03/06/2020   BILITOT 0.8 12/06/2020   Lab Results  Component Value Date   CHOL 226 (H) 10/19/2019   Lab Results   Component Value Date   HDL 33.40 (L) 10/19/2019   Lab Results  Component Value Date   LDLCALC  133 (H) 04/26/2017   Lab Results  Component Value Date   TRIG 276.0 (H) 10/19/2019   Lab Results  Component Value Date   CHOLHDL 7 10/19/2019   Lab Results  Component Value Date   PSA 0.26 12/06/2020   PSA 0.33 10/30/2014   Lab Results  Component Value Date   HGBA1C 5.3 01/09/2022   HGBA1C 5.3 01/09/2022   HGBA1C 5.3 (A) 01/09/2022   HGBA1C 5.3 01/09/2022   ASSESSMENT AND PLAN:   #1 diabetes without complication. POC Hba1c today is 5.3%! Cut metformin back to 1000 mg qd. He will return for urine microalbumin/creatinine as well as the remainder of his health panel when fasting.  #2 hypertension, well controlled on lisinopril 40 mg a day. Electrolytes and creatinine to be done soon when fasting.  3.  Hyperlipidemia, history of intolerance to pravastatin, possibly others. Patient has seen Dr. Debara Pickett for lipid management in the past and rosuvastatin 20 mg was recommended.  Patient does not recall ever doing this. LDL goal less than 100. Repeat lipids soon when fasting and if LDL greater than 100 we will proceed with trial of low-dose rosuvastatin and work her way up as needed.  I do not know if he would qualify for PCSK9 inhibitor but we can get him back to Dr. Debara Pickett to see about this if necessary.  #4 atrial fibrillation.  He is status post successful ablation about 1 year ago. Maintaining sinus rhythm. He continues on Cardizem 180 mg a day.  5) Health maintenance exam: Reviewed age and gender appropriate health maintenance issues (prudent diet, regular exercise, health risks of tobacco and excessive alcohol, use of seatbelts, fire alarms in home, use of sunscreen).  Also reviewed age and gender appropriate health screening as well as vaccine recommendations. Vaccines: Flu->given today.  Otherwise UTD. Labs: Return for fasting health panel and PSA. Prostate ca screening: PSA  today Colon ca screening: 2 adenomas on colonoscopy this year.  Plan repeat 3 years.   An After Visit Summary was printed and given to the patient.  FOLLOW UP:  Return in about 6 months (around 07/10/2022) for routine chronic illness f/u.  Signed:  Crissie Sickles, MD           01/09/2022

## 2022-01-12 ENCOUNTER — Other Ambulatory Visit (INDEPENDENT_AMBULATORY_CARE_PROVIDER_SITE_OTHER): Payer: BC Managed Care – PPO

## 2022-01-12 DIAGNOSIS — Z0000 Encounter for general adult medical examination without abnormal findings: Secondary | ICD-10-CM | POA: Diagnosis not present

## 2022-01-12 LAB — COMPREHENSIVE METABOLIC PANEL
ALT: 99 U/L — ABNORMAL HIGH (ref 0–53)
AST: 69 U/L — ABNORMAL HIGH (ref 0–37)
Albumin: 4.1 g/dL (ref 3.5–5.2)
Alkaline Phosphatase: 50 U/L (ref 39–117)
BUN: 11 mg/dL (ref 6–23)
CO2: 25 mEq/L (ref 19–32)
Calcium: 9.3 mg/dL (ref 8.4–10.5)
Chloride: 102 mEq/L (ref 96–112)
Creatinine, Ser: 0.79 mg/dL (ref 0.40–1.50)
GFR: 98.71 mL/min (ref >60.00)
Glucose, Bld: 121 mg/dL — ABNORMAL HIGH (ref 70–99)
Potassium: 4.4 mEq/L (ref 3.5–5.1)
Sodium: 138 mEq/L (ref 135–145)
Total Bilirubin: 0.6 mg/dL (ref 0.2–1.2)
Total Protein: 6.9 g/dL (ref 6.0–8.3)

## 2022-01-12 LAB — LIPID PANEL
Cholesterol: 211 mg/dL — ABNORMAL HIGH (ref 0–200)
HDL: 34.7 mg/dL — ABNORMAL LOW (ref >39.00)
NonHDL: 176.07
Total CHOL/HDL Ratio: 6
Triglycerides: 295 mg/dL — ABNORMAL HIGH (ref 0.0–149.0)
VLDL: 59 mg/dL — ABNORMAL HIGH (ref 0.0–40.0)

## 2022-01-12 LAB — CBC
HCT: 45.3 % (ref 39.0–52.0)
Hemoglobin: 15 g/dL (ref 13.0–17.0)
MCHC: 33.2 g/dL (ref 30.0–36.0)
MCV: 97.1 fl (ref 78.0–100.0)
Platelets: 181 10*3/uL (ref 150.0–400.0)
RBC: 4.67 Mil/uL (ref 4.22–5.81)
RDW: 13.1 % (ref 11.5–15.5)
WBC: 5.9 10*3/uL (ref 4.0–10.5)

## 2022-01-12 LAB — PSA: PSA: 0.3 ng/mL (ref 0.10–4.00)

## 2022-01-12 LAB — TSH: TSH: 2.32 u[IU]/mL (ref 0.35–5.50)

## 2022-01-12 LAB — LDL CHOLESTEROL, DIRECT: Direct LDL: 131 mg/dL

## 2022-01-13 ENCOUNTER — Telehealth: Payer: Self-pay

## 2022-01-13 DIAGNOSIS — E7800 Pure hypercholesterolemia, unspecified: Secondary | ICD-10-CM

## 2022-01-13 DIAGNOSIS — R7401 Elevation of levels of liver transaminase levels: Secondary | ICD-10-CM

## 2022-01-13 MED ORDER — ROSUVASTATIN CALCIUM 5 MG PO TABS: 5.0000 mg | ORAL_TABLET | Freq: Every day | ORAL | 2 refills | Status: DC

## 2022-01-13 NOTE — Telephone Encounter (Signed)
-----   Message from Tammi Sou, MD sent at 01/13/2022  8:08 AM EDT ----- Liver enzymes still with mild elevation but improved compared to past measurements. Suspect he has some fatty liver but would like to confirm this with liver ultrasound. Please arrange for the imaging location of his choice--Limited right upper quadrant ultrasound, diagnosis elevated transaminase.  Additionally, cholesterol is elevated similar to past measurements.  I recommend he start a trial of low-dose rosuvastatin.  Send in 5 mg tabs, 1 tab daily, #30, refill x2.  Lab visit for fasting lipid panel and AST and ALT in 2 to 3 months.  The remainder of his labs were normal.

## 2022-01-19 ENCOUNTER — Ambulatory Visit (HOSPITAL_BASED_OUTPATIENT_CLINIC_OR_DEPARTMENT_OTHER): Payer: BC Managed Care – PPO

## 2022-02-27 ENCOUNTER — Other Ambulatory Visit: Payer: Self-pay | Admitting: Family Medicine

## 2022-04-09 ENCOUNTER — Other Ambulatory Visit: Payer: Self-pay | Admitting: Family Medicine

## 2022-04-14 ENCOUNTER — Other Ambulatory Visit: Payer: BC Managed Care – PPO

## 2022-04-21 ENCOUNTER — Encounter: Payer: Self-pay | Admitting: Family Medicine

## 2022-04-21 ENCOUNTER — Ambulatory Visit: Payer: Self-pay

## 2022-04-21 ENCOUNTER — Ambulatory Visit (INDEPENDENT_AMBULATORY_CARE_PROVIDER_SITE_OTHER): Payer: BC Managed Care – PPO | Admitting: Family Medicine

## 2022-04-21 VITALS — BP 128/86 | Ht 74.0 in | Wt 206.0 lb

## 2022-04-21 DIAGNOSIS — M79672 Pain in left foot: Secondary | ICD-10-CM

## 2022-04-21 DIAGNOSIS — S9032XA Contusion of left foot, initial encounter: Secondary | ICD-10-CM | POA: Diagnosis not present

## 2022-04-21 MED ORDER — PREDNISONE 5 MG PO TABS: ORAL_TABLET | ORAL | 0 refills | Status: DC

## 2022-04-21 NOTE — Assessment & Plan Note (Signed)
Acutely occurring.  Changes appreciated within the lateral calcaneus and near the insertion of the calcific change of the Achilles. -Counseled on home exercise therapy and supportive care. -Green sport insoles with heel lift. -Prednisone -Could consider physical therapy or further imaging or CAM Walker.

## 2022-04-21 NOTE — Progress Notes (Signed)
Riley Moore - 56 y.o. male MRN [id number]  Date of birth: 29-Jan-1966  SUBJECTIVE:  Including CC & ROS.  No chief complaint on file.   Riley Moore is a 56 y.o. male that is presenting with acute left heel pain.  He struck his heel against a door frame.  Ongoing for roughly a week with no improvement.   Review of Systems See HPI   HISTORY: Past Medical, Surgical, Social, and Family History Reviewed & Updated per EMR.   Pertinent Historical Findings include:  Past Medical History:  Diagnosis Date   Anxiety attack    Diabetes mellitus ([place]) 07/2019   prediab 2015.  DM 2 dx 07/2019   Elevated transaminase level    suspect combo of hyperlip/fatty liver + alcohol.  Viral Hep panel neg 2016.   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 Percival Spanish.  Spont conv in ED.  Outpt eval showed nl ECHO--ASA qd and BB/CCB started.  Multiple cardioversions and recurrences, finally got ablation procedure.03/2019--no [place] b/c CHADVASC score low. Recurrence->ablation again 02/2021   Peyronie's disease     Past Surgical History:  Procedure Laterality Date   ATRIAL FIBRILLATION ABLATION N/A 03/31/2019   Procedure: ATRIAL FIBRILLATION ABLATION;  Surgeon: Thompson Grayer, MD;  Location: [place] [facility];  Service: Cardiovascular;  Laterality: N/A;   ATRIAL FIBRILLATION ABLATION N/A 02/06/2021   Procedure: ATRIAL FIBRILLATION ABLATION;  Surgeon: Thompson Grayer, MD;  Location: [place] [facility];  Service: Cardiovascular;  Laterality: N/A;   CARDIOVERSION N/A 06/11/2017   Procedure: CARDIOVERSION;  Surgeon: Skeet Latch, MD;  Location: [place];  Service: Cardiovascular;  Laterality: N/A;    CARDIOVERSION N/A 01/23/2019   Procedure: CARDIOVERSION;  Surgeon: Buford Dresser, MD;  Location: [place];  Service: Cardiovascular;  Laterality: N/A;   CARDIOVERSION N/A 05/24/2020   Procedure: CARDIOVERSION;  Surgeon: Jerline Pain, MD;  Location: [facility];  Service: Cardiovascular;  Laterality: N/A;   COLONOSCOPY  06/27/2021   06/2021->2 adenomas->recall 3 yrs per GI MD   DC cardioversion  06/11/2017   was back in A-fib within 5 min of waking up.   TRANSTHORACIC ECHOCARDIOGRAM  04/2012   2013-NORMAL (ED 55-65%).  01/2021 EF 60-65%, valves normal. 12/2020 EF 65-70%, normal.     PHYSICAL EXAM:  VS: BP 128/86   Ht '6\' 2"'$  (1.88 m)   Wt 206 lb (93.4 kg)   BMI 26.45 kg/m  Physical Exam Gen: NAD, alert, cooperative with exam, well-appearing MSK:  Neurovascularly intact    Limited ultrasound: Left heel pain:  Normal-appearing Achilles tendon. Moderate retrocalcaneal bursitis. Increased hyperemia over the lateral portion of the calcaneus with hyperemia at the insertion of the heel spur  Summary: Findings consistent with a contusion of the calcaneus  Ultrasound and interpretation by Clearance Coots, MD    ASSESSMENT & PLAN:   Contusion of left foot Acutely occurring.  Changes appreciated within the lateral calcaneus and near the insertion of the calcific change of the Achilles. -Counseled on home exercise therapy and supportive care. -Green sport insoles with heel lift. -Prednisone -Could consider physical therapy or further imaging or CAM Walker.

## 2022-04-21 NOTE — Patient Instructions (Signed)
Nice to meet you Please try ice  Please try the insoles with lift   Please avoid walking barefoot  Please try to limit your walking Please send me a message in MyChart with any questions or updates.  Please see me back in 2-3 weeks.   --Dr. Raeford Razor

## 2022-05-19 ENCOUNTER — Ambulatory Visit: Payer: BC Managed Care – PPO | Admitting: Family Medicine

## 2022-05-19 ENCOUNTER — Encounter: Payer: Self-pay | Admitting: Family Medicine

## 2022-05-19 VITALS — BP 118/75 | HR 65 | Temp 98.9°F | Ht 74.0 in | Wt 206.0 lb

## 2022-05-19 DIAGNOSIS — J329 Chronic sinusitis, unspecified: Secondary | ICD-10-CM

## 2022-05-19 DIAGNOSIS — B9689 Other specified bacterial agents as the cause of diseases classified elsewhere: Secondary | ICD-10-CM

## 2022-05-19 MED ORDER — DOXYCYCLINE HYCLATE 100 MG PO TABS: 100.0000 mg | ORAL_TABLET | Freq: Two times a day (BID) | ORAL | 0 refills | Status: DC

## 2022-05-19 MED ORDER — IPRATROPIUM BROMIDE 0.06 % NA SOLN: 2.0000 | Freq: Four times a day (QID) | NASAL | 12 refills | Status: DC

## 2022-05-19 NOTE — Progress Notes (Signed)
Riley Moore , [birth year], 57 y.o., male MRN: [id number] Patient Care Team    Relationship Specialty Notifications Start End  McGowen, Adrian Blackwater, MD PCP - General Family Medicine  03/22/14   Minus Breeding, MD PCP - Cardiology Cardiology  11/24/18   Sherran Needs, NP Nurse Practitioner Cardiology  05/20/17   Minus Breeding, MD Consulting Physician Cardiology  05/20/17   Trula Slade, DPM Consulting Physician Podiatry  08/07/18   Thompson Grayer, MD Consulting Physician Cardiology  03/06/20   Janith Lima, MD Consulting Physician Urology  05/24/20   Daryel November, MD Consulting Physician Gastroenterology  01/09/22     Chief Complaint  Patient presents with   Cough    Pt c/o ongoing cough, ear fullness, post nasal drip x 3 weeks     Subjective: Pt presents for an OV with complaints of cough of 3 weeks duration.  He states it can be productive.  Feeling ear fullness in his right ear, postnasal drip of about 3 weeks duration.  He denies fevers or chills.  He did try Coricidin to help with his symptoms in the beginning.  He states he feels like his symptoms just will not go completely away.     05/19/2022   10:58 AM 01/09/2022   11:08 AM 07/25/2019    2:29 PM 07/17/2019    8:11 AM 06/10/2018    8:47 AM  Depression screen PHQ 2/9  Decreased Interest 0 0 0 0 0  Down, Depressed, Hopeless 0 0 0 0 0  PHQ - 2 Score 0 0 0 0 0    Allergies  Allergen Reactions   Pravastatin     Decreased energy, "wiped out"   Social History   Social History Narrative   Lives at home with wife and two children.     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  Alc: 4 beers per day.  Denies hx of alc problems/abuse.  However, he says he tried quitting in the past and after a few days he started to have "panic attacks".         Past Medical History:  Diagnosis Date   Anxiety attack    Diabetes mellitus ([place]) 07/2019   prediab 2015.  DM 2 dx 07/2019    Elevated transaminase level    suspect combo of hyperlip/fatty liver + alcohol.  Viral Hep panel neg 2016.   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 Percival Spanish.  Spont conv in ED.  Outpt eval showed nl ECHO--ASA qd and BB/CCB started.  Multiple cardioversions and recurrences, finally got ablation procedure.03/2019--no [place] b/c CHADVASC score low. Recurrence->ablation again 02/2021   Peyronie's disease    Past Surgical History:  Procedure Laterality Date   ATRIAL FIBRILLATION ABLATION N/A 03/31/2019   Procedure: ATRIAL FIBRILLATION ABLATION;  Surgeon: Thompson Grayer, MD;  Location: [facility];  Service: Cardiovascular;  Laterality: N/A;   ATRIAL FIBRILLATION ABLATION N/A 02/06/2021   Procedure: ATRIAL FIBRILLATION ABLATION;  Surgeon: Thompson Grayer, MD;  Location: [facility];  Service: Cardiovascular;  Laterality: N/A;  CARDIOVERSION N/A 06/11/2017   Procedure: CARDIOVERSION;  Surgeon: Skeet Latch, MD;  Location: [facility];  Service: Cardiovascular;  Laterality: N/A;   CARDIOVERSION N/A 01/23/2019   Procedure: CARDIOVERSION;  Surgeon: Buford Dresser, MD;  Location: [facility];  Service: Cardiovascular;  Laterality: N/A;   CARDIOVERSION N/A 05/24/2020   Procedure: CARDIOVERSION;  Surgeon: Jerline Pain, MD;  Location: [facility];  Service: Cardiovascular;  Laterality: N/A;   COLONOSCOPY  06/27/2021   06/2021->2 adenomas->recall 3 yrs per GI MD   DC cardioversion  06/11/2017   was back in A-fib within 5 min of waking up.   TRANSTHORACIC ECHOCARDIOGRAM  04/2012   2013-NORMAL (ED 55-65%).  01/2021 EF 60-65%, valves normal. 12/2020 EF 65-70%, normal.    Family History  Problem Relation Age of Onset   Heart disease Father        pacemaker, atrial fibrillation   Hypertension Father    Colon cancer Neg Hx    Colon polyps Neg Hx    Esophageal cancer Neg Hx    Stomach cancer Neg Hx    Rectal cancer Neg Hx    Allergies as of 05/19/2022       Reactions   Pravastatin    Decreased energy, "wiped out"        Medication List        Accurate as of May 19, 2022 11:58 AM. If you have any questions, ask your nurse or doctor.          STOP taking these medications    predniSONE 5 MG tablet Commonly known as: DELTASONE Stopped by: Howard Pouch, DO       TAKE these medications    cetirizine 10 MG tablet Commonly known as: ZYRTEC Take 10 mg by mouth daily.   clotrimazole-betamethasone cream Commonly known as: LOTRISONE APPLY TO AFFECTED AREA TWICE A DAY   diltiazem 180 MG 24 hr capsule Commonly known as: CARDIZEM CD TAKE 1 CAPSULE BY MOUTH EVERY DAY   doxycycline 100 MG tablet Commonly known as: VIBRA-TABS Take 1 tablet (100 mg total) by mouth 2 (two) times daily. Started by: Howard Pouch, DO   escitalopram 10 MG tablet Commonly known as: LEXAPRO Take 1 tablet (10 mg total) by mouth daily. OFFICE VISIT NEEDED FOR FURTHER REFILLS   ipratropium 0.06 % nasal spray Commonly known as: ATROVENT Place 2 sprays into both nostrils 4 (four) times daily. Started by: Howard Pouch, DO   lisinopril 40 MG tablet Commonly known as: ZESTRIL TAKE 1 TABLET BY MOUTH EVERYDAY AT BEDTIME   metFORMIN 500 MG tablet Commonly known as: GLUCOPHAGE TAKE 1 TAB BY MOUTH EVERY MORNING AND 2 TABS EVERY EVENING .   rosuvastatin 5 MG tablet Commonly known as: CRESTOR TAKE 1 TABLET (5 MG TOTAL) BY MOUTH DAILY.        All past medical history, surgical history, allergies, family history, immunizations andmedications were updated in the EMR today and reviewed under the history and medication portions of their EMR.     Review of  Systems  Constitutional:  Positive for malaise/fatigue. Negative for chills and fever.  HENT:  Positive for congestion and ear pain. Negative for sinus pain and sore throat.   Respiratory:  Positive for cough and sputum production. Negative for shortness of breath and wheezing.   Gastrointestinal:  Negative for diarrhea, nausea and vomiting.  Musculoskeletal:  Negative for myalgias.  Skin:  Negative for rash.  Neurological:  Negative for headaches.   Negative, with the exception of above mentioned in  HPI   Objective:  BP 118/75   Pulse 65   Temp 98.9 F (37.2 C) (Oral)   Ht '6\' 2"'$  (1.88 m)   Wt 206 lb (93.4 kg)   SpO2 99%   BMI 26.45 kg/m  Body mass index is 26.45 kg/m. Physical Exam Vitals and nursing note reviewed.  Constitutional:      General: He is not in acute distress.    Appearance: Normal appearance. He is not ill-appearing, toxic-appearing or diaphoretic.  HENT:     Head: Normocephalic and atraumatic.     Comments: Bilateral cerumen present in distal canals.  No impaction.    Right Ear: Tympanic membrane and external ear normal.     Left Ear: Tympanic membrane and external ear normal.     Mouth/Throat:     Mouth: Mucous membranes are moist.     Pharynx: Posterior oropharyngeal erythema present. No oropharyngeal exudate.     Comments: Postnasal drip Eyes:     General: No scleral icterus.       Right eye: No discharge.        Left eye: No discharge.     Extraocular Movements: Extraocular movements intact.     Pupils: Pupils are equal, round, and reactive to light.  Cardiovascular:     Rate and Rhythm: Normal rate and regular rhythm.  Pulmonary:     Effort: Pulmonary effort is normal. No respiratory distress.     Breath sounds: Normal breath sounds. No wheezing, rhonchi or rales.  Musculoskeletal:     Cervical back: Neck supple.  Lymphadenopathy:     Cervical: Cervical adenopathy present.  Skin:    General: Skin is warm and dry.     Coloration: Skin is not  jaundiced or pale.     Findings: No rash.  Neurological:     Mental Status: He is alert and oriented to person, place, and time. Mental status is at baseline.  Psychiatric:        Mood and Affect: Mood normal.        Behavior: Behavior normal.        Thought Content: Thought content normal.        Judgment: Judgment normal.     No results found. No results found. No results found for this or any previous visit (from the past 24 hour(s)).  Assessment/Plan: Riley Moore is a 57 y.o. male present for OV for  Bacterial sinusitis Rest, hydrate.  mucinex (DM if cough) Nasal saline flushes, then Atrovent nasal spray as needed Doxy BID prescribed, take until completed.  If cough present it can last up to 6-8 weeks.  F/U 2 weeks if not improved.     Reviewed expectations re: course of current medical issues. Discussed self-management of symptoms. Outlined signs and symptoms indicating need for more acute intervention. Patient verbalized understanding and all questions were answered. Patient received an After-Visit Summary.    No orders of the defined types were placed in this encounter.  Meds ordered this encounter  Medications   doxycycline (VIBRA-TABS) 100 MG tablet    Sig: Take 1 tablet (100 mg total) by mouth 2 (two) times daily.    Dispense:  20 tablet    Refill:  0   ipratropium (ATROVENT) 0.06 % nasal spray    Sig: Place 2 sprays into both nostrils 4 (four) times daily.    Dispense:  15 mL    Refill:  12   Referral Orders  No referral(s) requested today  Note is dictated utilizing voice recognition software. Although note has been proof read prior to signing, occasional typographical errors still can be missed. If any questions arise, please do not hesitate to call for verification.   electronically signed by:  Howard Pouch, DO  [place]

## 2022-06-01 ENCOUNTER — Emergency Department (HOSPITAL_BASED_OUTPATIENT_CLINIC_OR_DEPARTMENT_OTHER): Payer: BC Managed Care – PPO | Admitting: Radiology

## 2022-06-01 ENCOUNTER — Encounter (HOSPITAL_BASED_OUTPATIENT_CLINIC_OR_DEPARTMENT_OTHER): Payer: Self-pay | Admitting: Emergency Medicine

## 2022-06-01 ENCOUNTER — Other Ambulatory Visit: Payer: Self-pay

## 2022-06-01 ENCOUNTER — Emergency Department (HOSPITAL_BASED_OUTPATIENT_CLINIC_OR_DEPARTMENT_OTHER)
Admission: EM | Admit: 2022-06-01 | Discharge: 2022-06-01 | Disposition: A | Discharge status: Home / Self Care | Payer: BC Managed Care – PPO | Attending: Emergency Medicine | Admitting: Emergency Medicine

## 2022-06-01 DIAGNOSIS — Z7984 Long term (current) use of oral hypoglycemic drugs: Secondary | ICD-10-CM | POA: Insufficient documentation

## 2022-06-01 DIAGNOSIS — H9201 Otalgia, right ear: Secondary | ICD-10-CM | POA: Diagnosis not present

## 2022-06-01 DIAGNOSIS — H9391 Unspecified disorder of right ear: Secondary | ICD-10-CM | POA: Insufficient documentation

## 2022-06-01 DIAGNOSIS — R079 Chest pain, unspecified: Secondary | ICD-10-CM | POA: Diagnosis not present

## 2022-06-01 DIAGNOSIS — E119 Type 2 diabetes mellitus without complications: Secondary | ICD-10-CM | POA: Insufficient documentation

## 2022-06-01 DIAGNOSIS — I4892 Unspecified atrial flutter: Secondary | ICD-10-CM | POA: Insufficient documentation

## 2022-06-01 DIAGNOSIS — Z7901 Long term (current) use of anticoagulants: Secondary | ICD-10-CM | POA: Diagnosis not present

## 2022-06-01 DIAGNOSIS — I10 Essential (primary) hypertension: Secondary | ICD-10-CM | POA: Insufficient documentation

## 2022-06-01 DIAGNOSIS — J9811 Atelectasis: Secondary | ICD-10-CM | POA: Diagnosis not present

## 2022-06-01 DIAGNOSIS — H938X1 Other specified disorders of right ear: Secondary | ICD-10-CM

## 2022-06-01 DIAGNOSIS — Z79899 Other long term (current) drug therapy: Secondary | ICD-10-CM | POA: Insufficient documentation

## 2022-06-01 LAB — COMPREHENSIVE METABOLIC PANEL
ALT: 144 U/L — ABNORMAL HIGH (ref 0–44)
AST: 67 U/L — ABNORMAL HIGH (ref 15–41)
Albumin: 4.4 g/dL (ref 3.5–5.0)
Alkaline Phosphatase: 60 U/L (ref 38–126)
Anion gap: 11 (ref 5–15)
BUN: 15 mg/dL (ref 6–20)
CO2: 23 mmol/L (ref 22–32)
Calcium: 10.4 mg/dL — ABNORMAL HIGH (ref 8.9–10.3)
Chloride: 101 mmol/L (ref 98–111)
Creatinine, Ser: 0.75 mg/dL (ref 0.61–1.24)
GFR, Estimated: >60 mL/min (ref >60)
Glucose, Bld: 177 mg/dL — ABNORMAL HIGH (ref 70–99)
Potassium: 4.4 mmol/L (ref 3.5–5.1)
Sodium: 135 mmol/L (ref 135–145)
Total Bilirubin: 0.6 mg/dL (ref 0.3–1.2)
Total Protein: 8 g/dL (ref 6.5–8.1)

## 2022-06-01 LAB — CBC WITH DIFFERENTIAL/PLATELET
Abs Immature Granulocytes: 0.07 10*3/uL (ref 0.00–0.07)
Basophils Absolute: 0.1 10*3/uL (ref 0.0–0.1)
Basophils Relative: 1 %
Eosinophils Absolute: 0.4 10*3/uL (ref 0.0–0.5)
Eosinophils Relative: 5 %
HCT: 45.8 % (ref 39.0–52.0)
Hemoglobin: 15.8 g/dL (ref 13.0–17.0)
Immature Granulocytes: 1 %
Lymphocytes Relative: 29 %
Lymphs Abs: 2.7 10*3/uL (ref 0.7–4.0)
MCH: 31.4 pg (ref 26.0–34.0)
MCHC: 34.5 g/dL (ref 30.0–36.0)
MCV: 91.1 fL (ref 80.0–100.0)
Monocytes Absolute: 0.8 10*3/uL (ref 0.1–1.0)
Monocytes Relative: 9 %
Neutro Abs: 5.3 10*3/uL (ref 1.7–7.7)
Neutrophils Relative %: 55 %
Platelets: 210 10*3/uL (ref 150–400)
RBC: 5.03 MIL/uL (ref 4.22–5.81)
RDW: 12.5 % (ref 11.5–15.5)
WBC: 9.4 10*3/uL (ref 4.0–10.5)
nRBC: 0 % (ref 0.0–0.2)

## 2022-06-01 LAB — TROPONIN I (HIGH SENSITIVITY): Troponin I (High Sensitivity): 4 ng/L (ref <18)

## 2022-06-01 LAB — BRAIN NATRIURETIC PEPTIDE: B Natriuretic Peptide: 37.5 pg/mL (ref 0.0–100.0)

## 2022-06-01 MED ORDER — METOPROLOL SUCCINATE ER 25 MG PO TB24: 25.0000 mg | ORAL_TABLET | Freq: Every day | ORAL | 0 refills | Status: DC

## 2022-06-01 MED ORDER — DILTIAZEM LOAD VIA INFUSION
10.0000 mg | Freq: Once | INTRAVENOUS | Status: DC
  Filled 2022-06-01: qty 10

## 2022-06-01 MED ORDER — SODIUM CHLORIDE 0.9 % IV BOLUS
1000.0000 mL | Freq: Once | INTRAVENOUS | Status: AC
  Administered 2022-06-01: 1000 mL via INTRAVENOUS

## 2022-06-01 MED ORDER — RIVAROXABAN 20 MG PO TABS
20.0000 mg | ORAL_TABLET | Freq: Once | ORAL | Status: AC
  Administered 2022-06-01: 20 mg via ORAL
  Filled 2022-06-01: qty 1

## 2022-06-01 MED ORDER — RIVAROXABAN 20 MG PO TABS: 20.0000 mg | ORAL_TABLET | Freq: Every day | ORAL | 0 refills | Status: DC

## 2022-06-01 MED ORDER — DILTIAZEM HCL-DEXTROSE 125-5 MG/125ML-% IV SOLN (PREMIX): 5.0000 mg/h | INTRAVENOUS | Status: DC

## 2022-06-01 MED ORDER — DILTIAZEM HCL 25 MG/5ML IV SOLN
10.0000 mg | Freq: Once | INTRAVENOUS | Status: AC
  Administered 2022-06-01: 10 mg via INTRAVENOUS
  Filled 2022-06-01: qty 5

## 2022-06-01 MED ORDER — METOPROLOL TARTRATE 25 MG PO TABS
25.0000 mg | ORAL_TABLET | Freq: Once | ORAL | Status: AC
  Administered 2022-06-01: 25 mg via ORAL
  Filled 2022-06-01: qty 1

## 2022-06-01 MED ORDER — DILTIAZEM HCL ER 90 MG PO CP12: 90.0000 mg | ORAL_CAPSULE | Freq: Two times a day (BID) | ORAL | 0 refills | Status: DC

## 2022-06-01 NOTE — ED Notes (Signed)
ED Provider at bedside. 

## 2022-06-01 NOTE — Discharge Instructions (Addendum)
The workup today was overall reassuring.  As discussed, we will continue her on home anticoagulation in the form of Xarelto as well as your blood pressure and heart rate medicines of Cardizem and metoprolol.  Regarding your right ear pressure, will treat this with decongestion in the form of Coricidin which he can find over-the-counter to local pharmacy.  Follow-up with A-fib clinic this week for reevaluation; call them at your earliest convenience tomorrow as they are expecting you.  I talked with Dr. Gardiner Rhyme.  Please do not hesitate to return to emergency department for worrisome signs and symptoms we discussed become apparent.

## 2022-06-01 NOTE — ED Triage Notes (Signed)
Pt arrived POV. Pt states he was sick in December with congestion and that the past 4-5 days he has felt like he has had "build up" in the R side because it is harder to hear on the R side and he has had issues with the ears in the past. Pt denies pain at this time.

## 2022-06-01 NOTE — ED Provider Notes (Signed)
[facility] Provider Note   CSN: [id number] Arrival date & time: 06/01/22  1231     History  No chief complaint on file.   Riley Moore is a 57 y.o. male.  HPI   57 year old male presents emergency department with complaints of cough, nasal congestion since December.  Patient reports over the past 4 to 5 days has felt increased pressure in his right ear.  Has history of cerumen impaction as well as serous otitis media per patient of which she regularly uses peroxide water mix to flush out ear.  Has been trying at home Mucinex, nasal steroid spray for symptoms and has helped some.  Reports history of peripheral vertigo that began as he is currently feeling in his afraid of experiencing vertiginous type symptoms again.  Denies fever, chest pain, shortness of breath, abdominal pain, nausea, vomiting, urinary symptoms, change in bowel habits.  Past medical history significant for diabetes mellitus, atrial fibrillation, hypertension, hyperlipidemia, gout  Home Medications Prior to Admission medications   Medication Sig Start Date End Date Taking? Authorizing Provider  diltiazem (CARDIZEM SR) 90 MG 12 hr capsule Take 1 capsule (90 mg total) by mouth 2 (two) times daily. 06/01/22  Yes Dion Saucier A, PA  metoprolol succinate (TOPROL-XL) 25 MG 24 hr tablet Take 1 tablet (25 mg total) by mouth daily. 06/01/22  Yes Dion Saucier A, PA  rivaroxaban (XARELTO) 20 MG TABS tablet Take 1 tablet (20 mg total) by mouth daily with supper. 06/01/22  Yes Dion Saucier A, PA  cetirizine (ZYRTEC) 10 MG tablet Take 10 mg by mouth daily.    [provider]  clotrimazole-betamethasone (LOTRISONE) cream APPLY TO AFFECTED AREA TWICE A DAY 01/09/22   McGowen, Adrian Blackwater, MD  diltiazem (CARDIZEM CD) 180 MG 24 hr capsule TAKE 1 CAPSULE BY MOUTH EVERY DAY 07/14/21   Minus Breeding, MD  doxycycline (VIBRA-TABS) 100 MG tablet Take 1 tablet (100 mg total) by mouth 2  (two) times daily. 05/19/22   Kuneff, Renee A, DO  escitalopram (LEXAPRO) 10 MG tablet Take 1 tablet (10 mg total) by mouth daily. OFFICE VISIT NEEDED FOR FURTHER REFILLS 01/09/22   McGowen, Adrian Blackwater, MD  ipratropium (ATROVENT) 0.06 % nasal spray Place 2 sprays into both nostrils 4 (four) times daily. 05/19/22   Kuneff, Renee A, DO  lisinopril (ZESTRIL) 40 MG tablet TAKE 1 TABLET BY MOUTH EVERYDAY AT BEDTIME 01/09/22   McGowen, Adrian Blackwater, MD  metFORMIN (GLUCOPHAGE) 500 MG tablet TAKE 1 TAB BY MOUTH EVERY MORNING AND 2 TABS EVERY EVENING . 02/27/22   McGowen, Adrian Blackwater, MD  rosuvastatin (CRESTOR) 5 MG tablet TAKE 1 TABLET (5 MG TOTAL) BY MOUTH DAILY. 04/09/22   McGowen, Adrian Blackwater, MD      Allergies    Pravastatin    Review of Systems   Review of Systems  All other systems reviewed and are negative.   Physical Exam Updated Vital Signs BP (!) 134/94   Pulse 89   Temp 98 F (36.7 C) (Oral)   Resp 14   Ht '6\' 2"'$  (1.88 m)   Wt 95.3 kg   SpO2 98%   BMI 26.96 kg/m  Physical Exam Vitals and nursing note reviewed.  Constitutional:      General: He is not in acute distress.    Appearance: He is well-developed.  HENT:     Head: Normocephalic and atraumatic.     Right Ear: A middle ear effusion is present. No  mastoid tenderness. Tympanic membrane is not injected, scarred, perforated or erythematous.     Left Ear: Tympanic membrane normal.  Eyes:     Conjunctiva/sclera: Conjunctivae normal.  Cardiovascular:     Rate and Rhythm: Tachycardia present. Rhythm irregular.     Heart sounds: No murmur heard. Pulmonary:     Effort: Pulmonary effort is normal. No respiratory distress.     Breath sounds: Normal breath sounds.  Abdominal:     Palpations: Abdomen is soft.     Tenderness: There is no abdominal tenderness.  Musculoskeletal:        General: No swelling.     Cervical back: Neck supple.  Skin:    General: Skin is warm and dry.     Capillary Refill: Capillary refill takes less than 2  seconds.  Neurological:     Mental Status: He is alert.  Psychiatric:        Mood and Affect: Mood normal.     ED Results / Procedures / Treatments   Labs (all labs ordered are listed, but only abnormal results are displayed) Labs Reviewed  COMPREHENSIVE METABOLIC PANEL - Abnormal; Notable for the following components:      Result Value   Glucose, Bld 177 (*)    Calcium 10.4 (*)    AST 67 (*)    ALT 144 (*)    All other components within normal limits  CBC WITH DIFFERENTIAL/PLATELET  BRAIN NATRIURETIC PEPTIDE  TROPONIN I (HIGH SENSITIVITY)    EKG EKG Interpretation  Date/Time:  Monday June 01 2022 14:05:51 EST Ventricular Rate:  141 PR Interval:  143 QRS Duration: 112 QT Interval:  305 QTC Calculation: 420 R Axis:   34 Text Interpretation: Ectopic atrial tachycardia, unifocal Multiform ventricular premature complexes Borderline intraventricular conduction delay Abnormal R-wave progression, early transition Borderline repolarization abnormality atrial  Atrial fibrillation  Confirmed by Ezequiel Essex [phone]) on 06/01/2022 2:32:02 PM  Radiology DG Chest 1 View  Result Date: 06/01/2022 CLINICAL DATA:  Provided history: Chest pain. EXAM: CHEST  1 VIEW COMPARISON:  Prior chest radiographs 04/29/2017 and earlier. FINDINGS: Heart size within normal limits. Minimal atelectasis within the left lung base. No appreciable airspace consolidation. No evidence of pleural effusion or pneumothorax. No acute bony abnormality identified. IMPRESSION: 1. Minimal atelectasis within the left lung base. 2. Otherwise, no evidence of acute cardiopulmonary abnormality. Electronically Signed   By: Kellie Simmering D.O.   On: 06/01/2022 15:52    Procedures .Critical Care  Performed by: Wilnette Kales, PA Authorized by: Wilnette Kales, PA   Critical care provider statement:    Critical care time (minutes):  70   Critical care was necessary to treat or prevent imminent or life-threatening  deterioration of the following conditions:  Cardiac failure   Critical care was time spent personally by me on the following activities:  Development of treatment plan with patient or surrogate, discussions with consultants, evaluation of patient's response to treatment, examination of patient, ordering and review of laboratory studies, ordering and review of radiographic studies, ordering and performing treatments and interventions, pulse oximetry, re-evaluation of patient's condition and review of old charts   I assumed direction of critical care for this patient from another provider in my specialty: no       Medications Ordered in ED Medications  sodium chloride 0.9 % bolus 1,000 mL (0 mLs Intravenous Stopped 06/01/22 1610)  diltiazem (CARDIZEM) injection 10 mg (10 mg Intravenous Given 06/01/22 1442)  metoprolol tartrate (LOPRESSOR) tablet 25 mg (25  mg Oral Given 06/01/22 1608)  rivaroxaban (XARELTO) tablet 20 mg (20 mg Oral Given 06/01/22 1608)  diltiazem (CARDIZEM) injection 10 mg (10 mg Intravenous Given 06/01/22 1609)    ED Course/ Medical Decision Making/ A&P Clinical Course as of 06/01/22 1909  Mon Jun 01, 2022  1450 Discussed case with attending physician Dr. Wyvonnia Dusky who independently evaluated the patient is agreement with treatment plan going forward. [CR]  [place] Dr. Nechama Guard of cardiology.  Recommendation is trial of second dose of intravenous Cardizem with oral 25 of metoprolol and beginning anticoagulation for with Xarelto and gauging response of heart rate.  If no response, plan for admission.  If response, plan to discharge with follow-up with A-fib clinic this week. [CR]    Clinical Course User Index [CR] Wilnette Kales, PA                             Medical Decision Making Amount and/or Complexity of Data Reviewed Labs: ordered. Radiology: ordered.  Risk Prescription drug management.   This patient presents to the ED for concern of right ear pain, this  involves an extensive number of treatment options, and is a complaint that carries with it a high risk of complications and morbidity.  The differential diagnosis includes otitis media, otitis externa, cellulitis, erysipelas, perforated TM, CVA, acoustic neuroma   Co morbidities that complicate the patient evaluation  See HPI   Additional history obtained:  Additional history obtained from EMR External records from outside source obtained and reviewed including hospital records   Lab Tests:  I Ordered, and personally interpreted labs.  The pertinent results include: No leukocytosis.  No evidence of anemia.  Platelets within range.  No electrolyte abnormalities appreciated.  Renal function within normal limits.  Transaminitis with an AST of 67 and ALT of 144.  Initial troponin of 4; EKG atrial flutter with rate of 141.  BNP of 37.5.   Imaging Studies ordered:  I ordered imaging studies including chest x-ray I independently visualized and interpreted imaging which showed minimal atelectasis within the left lung base.  Otherwise no acute cardiopulmonary abnormality. I agree with the radiologist interpretation  Cardiac Monitoring: / EKG:  The patient was maintained on a cardiac monitor.  I personally viewed and interpreted the cardiac monitored which showed an underlying rhythm of: Atrial flutter with a rate of 141   Consultations Obtained:  See ED course  Problem List / ED Course / Critical interventions / Medication management  Atrial flutter with RVR I ordered medication including Cardizem, Lopressor, Xarelto   Reevaluation of the patient after these medicines showed that the patient improved I have reviewed the patients home medicines and have made adjustments as needed   Social Determinants of Health:  Denies tobacco, illicit drug use   Test / Admission - Considered:  Atrial flutter with RVR/right ear pressure Vitals signs significant for heart rate initially of  greater than 140 which decreased to within the normal range with medicines administered while emergency department.. Otherwise within normal range and stable throughout visit. Laboratory/imaging studies significant for: See above Patient with evidence of atrial flutter with rapid ventricular rate.  Rate controlled with 2 doses of Cardizem as well as metoprolol while in the emergency department.  Discussed case with cardiology as well as attending physician Dr. Wyvonnia Dusky as depicted in ED course.  Patient noted prompt response of heart rate to medicines administered as well as anticoagulation begun.  Per cardiology,  recommend very close follow-up with atrial fibrillation clinic outpatient for further assessment/medical adjustment.  Regarding patient's serous fluid behind right TM, recommended Coricidin as decongestion given history of hypertension.  Follow-up with ENT recommended.  Treatment plan discussed at length with patient and he acknowledged understanding was agreeable to said plan. Worrisome signs and symptoms were discussed with the patient, and the patient acknowledged understanding to return to the ED if noticed. Patient was stable upon discharge.          Final Clinical Impression(s) / ED Diagnoses Final diagnoses:  Atrial flutter with rapid ventricular response (HCC)  Ear pressure, right    Rx / DC Orders ED Discharge Orders          Ordered    rivaroxaban (XARELTO) 20 MG TABS tablet  Daily with supper        06/01/22 1713    metoprolol succinate (TOPROL-XL) 25 MG 24 hr tablet  Daily        06/01/22 1713    diltiazem (CARDIZEM SR) 90 MG 12 hr capsule  2 times daily        06/01/22 1713    Amb Referral to AFIB Clinic        06/01/22 [place], [place], [place] 06/01/22 Pauline Aus    Ezequiel Essex, MD 06/02/22 0730

## 2022-06-01 NOTE — ED Notes (Signed)
Patient called for triage.  Is in restroom.

## 2022-06-01 NOTE — ED Notes (Signed)
EKG given to provider

## 2022-06-04 ENCOUNTER — Encounter (HOSPITAL_COMMUNITY): Payer: Self-pay | Admitting: Nurse Practitioner

## 2022-06-04 ENCOUNTER — Ambulatory Visit (HOSPITAL_COMMUNITY)
Admission: RE | Admit: 2022-06-04 | Discharge: 2022-06-04 | Disposition: A | Discharge status: Home / Self Care | Payer: BC Managed Care – PPO | Source: Ambulatory Visit | Attending: Nurse Practitioner | Admitting: Nurse Practitioner

## 2022-06-04 VITALS — BP 150/80 | HR 88 | Ht 74.0 in | Wt 210.0 lb

## 2022-06-04 DIAGNOSIS — I4892 Unspecified atrial flutter: Secondary | ICD-10-CM

## 2022-06-04 DIAGNOSIS — Z8249 Family history of ischemic heart disease and other diseases of the circulatory system: Secondary | ICD-10-CM | POA: Diagnosis not present

## 2022-06-04 DIAGNOSIS — Z79899 Other long term (current) drug therapy: Secondary | ICD-10-CM | POA: Diagnosis not present

## 2022-06-04 DIAGNOSIS — Z7982 Long term (current) use of aspirin: Secondary | ICD-10-CM | POA: Diagnosis not present

## 2022-06-04 DIAGNOSIS — I10 Essential (primary) hypertension: Secondary | ICD-10-CM | POA: Diagnosis not present

## 2022-06-04 MED ORDER — METFORMIN HCL 500 MG PO TABS: ORAL_TABLET | ORAL | Status: DC

## 2022-06-04 NOTE — Progress Notes (Addendum)
Primary Care Physician: Tammi Sou, MD Referring Physician: [facility] ER f/u Primary EP: Dr Zadie Cleverly Riley Moore is a 57 y.o. male with a h/o afib first onset in 2013. He did well for a number of years, staying in [place], but recently found to be in [place] in April 29, 2017, when he went to the ER for chest pain. His chest pain resolved but he continued to be in afib. It was rate controlled. He was unaware. He did not drink large amounts of caffeine but stated that he drank  a six pack a night and may increase to 8 on the weekends for anxiety. He reported  that he would have panic attacks if he cut  down on his beer intake. He has been on ASA daily, diltiazem and metoprolol for years. Chadsvasc score is 1 for HTN.  When I initially saw him, I was concerned to start anticoagulation with his alcoholism and wanted him to see his PCP for means of treating his anxiety, other than with alcohol. He was pending a f/u with Dr. Percival Spanish within the month.   He was started on anticoagulation and cardioverted, however he had ERAF before leaving the hospital. He was told to come back for an antiarrythmic when he was able to stop alcohol. He just did f/u with Dr. Percival Spanish again 11/24/18. He has stopped alcohol a while back and wanted to try to get back in rhythm. The plan was for pt to start anticoagulation x 3 weeks and then start flecainide 50 mg bid. I was to see pt a few days after starting flecainide. the pt has not started xarelto yet but does plan to start 8/10. Hence,  I am seeing pt prematurely in the midst of this plan.  He states that he is still  asymptomatic with afib and is rate controlled. If he fails AAD/DCCV, he wants to pursue ablation.  F/u in afib clinic, 9/3. He is now on flecainide 50 mg bid. He remains in atrial flutter at 83 bpm. He is tolerating flecainide, feels no different on drug. No missed doses of xarelto since starting.   He was increased to flecainide 100 mg bid and cardioverted,  9/21.. This was successful but he is now back in [place], unsure of onset. He is not aware of afib/flutter.   Follow up AF clinic 05/01/19. Patient is s/p afib and flutter ablation with Dr Rayann Heman on 03/31/19. He reports that he has done well since his ablation from a cardiac standpoint. He did have COVID-19 and is recovering from that. He denies any heart racing or palpitations. He denies CP, swallowing, or groin issues.  F/u with in afib clinic, 03/05/21,  with second afib ablation, one month ago.He reports that he has not had any afib since the procedure. He is back to his usual activities. He continues on xarelto. SR on ekg today. No groin or swallowing issues.   F/u in afib clinic, 06/04/22. He was in the ED for ear fullness as he had developed an URI with sinus issues and cough. He started using an old albuterol inhaler several times a day for cough. He had also been on steroids. He  presented with atrial flutter with RVR. He was unaware. Cardiology was consulted and he was asked to start BB and xarelto for CHA2DS2VASc  score of 1. He converted to SR later after d/c and did not start the meds. He does wear an apple watch but did not receive an alert  that day for elevated heart rate.   Today, he denies symptoms of palpitations, chest pain, shortness of breath, orthopnea, PND, lower extremity edema, dizziness, presyncope, syncope, or neurologic sequela. The patient is tolerating medications without difficulties and is otherwise without complaint today.   Past Medical History:  Diagnosis Date   Anxiety attack    Diabetes mellitus ([place]) 07/2019   prediab 2015.  DM 2 dx 07/2019   Elevated transaminase level    suspect combo of hyperlip/fatty liver + alcohol.  Viral Hep panel neg 2016.   Gout    multiple episodes (L MTP jt)   History of gluten intolerance    Hyperlipemia    Intolerant of statins: tolerates welchol   Hypertension    Left foot pain 2020   cavus foot type resulting in midfoot  capsulitis; posterior heel spur; arthritis first MPJ   Perennial allergic rhinitis    Persistent atrial fibrillation ([place]) 2013; 2019   Dr. Percival Spanish.  Spont conv in ED.  Outpt eval showed nl ECHO--ASA qd and BB/CCB started.  Multiple cardioversions and recurrences, finally got ablation procedure.03/2019--no [place] b/c CHADVASC score low. Recurrence->ablation again 02/2021   Peyronie's disease    Past Surgical History:  Procedure Laterality Date   ATRIAL FIBRILLATION ABLATION N/A 03/31/2019   Procedure: ATRIAL FIBRILLATION ABLATION;  Surgeon: Thompson Grayer, MD;  Location: [place] [facility];  Service: Cardiovascular;  Laterality: N/A;   ATRIAL FIBRILLATION ABLATION N/A 02/06/2021   Procedure: ATRIAL FIBRILLATION ABLATION;  Surgeon: Thompson Grayer, MD;  Location: [place] [facility];  Service: Cardiovascular;  Laterality: N/A;   CARDIOVERSION N/A 06/11/2017   Procedure: CARDIOVERSION;  Surgeon: Skeet Latch, MD;  Location: [place];  Service: Cardiovascular;  Laterality: N/A;   CARDIOVERSION N/A 01/23/2019   Procedure: CARDIOVERSION;  Surgeon: Buford Dresser, MD;  Location: [place];  Service: Cardiovascular;  Laterality: N/A;   CARDIOVERSION N/A 05/24/2020   Procedure: CARDIOVERSION;  Surgeon: Jerline Pain, MD;  [facility];  Service: Cardiovascular;  Laterality: N/A;   COLONOSCOPY  06/27/2021   06/2021->2 adenomas->recall 3 yrs per GI MD   DC cardioversion  06/11/2017   was back in A-fib within 5 min of waking up.   TRANSTHORACIC ECHOCARDIOGRAM  04/2012   2013-NORMAL (ED 55-65%).  01/2021 EF 60-65%, valves normal. 12/2020 EF 65-70%, normal.    Current Outpatient Medications  Medication Sig Dispense Refill   cetirizine (ZYRTEC) 10 MG tablet Take 10 mg by mouth daily.     clotrimazole-betamethasone (LOTRISONE) cream APPLY TO AFFECTED AREA TWICE A DAY 30 g 1   diltiazem (CARDIZEM CD) 180 MG 24 hr capsule TAKE 1 CAPSULE BY MOUTH EVERY DAY 90 capsule 3    DM-APAP-CPM (CORICIDIN HBP PO) Take by mouth. Takng 3-4 tablets by mouth daily as needed     escitalopram (LEXAPRO) 10 MG tablet Take 1 tablet (10 mg total) by mouth daily. OFFICE VISIT NEEDED FOR FURTHER REFILLS 90 tablet 1   ipratropium (ATROVENT) 0.06 % nasal spray Place 2 sprays into both nostrils 4 (four) times daily. 15 mL 12   lisinopril (ZESTRIL) 40 MG tablet TAKE 1 TABLET BY MOUTH EVERYDAY AT BEDTIME 90 tablet 1   metFORMIN (GLUCOPHAGE) 500 MG tablet TAKE 1 TAB BY MOUTH EVERY MORNING     metoprolol succinate (TOPROL-XL) 25 MG 24 hr tablet Take 1 tablet (25 mg total) by mouth daily. (Patient not taking: Reported on 06/04/2022) 15 tablet 0   rivaroxaban (XARELTO) 20 MG TABS tablet Take 1 tablet (20 mg total)  by mouth daily with supper. (Patient not taking: Reported on 06/04/2022) 90 tablet 0   rosuvastatin (CRESTOR) 5 MG tablet TAKE 1 TABLET (5 MG TOTAL) BY MOUTH DAILY. (Patient not taking: Reported on 06/04/2022) 90 tablet 0   No current facility-administered medications for this encounter.    Allergies  Allergen Reactions   Acetaminophen Diarrhea   Pravastatin     Decreased energy, "wiped out"    Social History   Socioeconomic History   Marital status: Married    Spouse name: Not on file   Number of children: 2   Years of education: Not on file   Highest education level: Not on file  Occupational History   Occupation: Chief Financial Officer  Tobacco Use   Smoking status: Never   Smokeless tobacco: Never  Vaping Use   Vaping Use: Never used  Substance and Sexual Activity   Alcohol use: Not Currently    Alcohol/week: 4.0 - 5.0 standard drinks of alcohol    Types: 4 - 5 Cans of beer per week    Comment: daily   Drug use: No   Sexual activity: Not on file  Other Topics Concern   Not on file  Social History Narrative   Lives at home with wife and two children.     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  Alc: 4 beers per day.   Denies hx of alc problems/abuse.  However, he says he tried quitting in the past and after a few days he started to have "panic attacks".         Social Determinants of Health   Financial Resource Strain: Not on file  Food Insecurity: Not on file  Transportation Needs: Not on file  Physical Activity: Not on file  Stress: Not on file  Social Connections: Not on file  Intimate Partner Violence: Not on file    Family History  Problem Relation Age of Onset   Heart disease Father        pacemaker, atrial fibrillation   Hypertension Father    Colon cancer Neg Hx    Colon polyps Neg Hx    Esophageal cancer Neg Hx    Stomach cancer Neg Hx    Rectal cancer Neg Hx     ROS- All systems are reviewed and negative except as per the HPI above  Physical Exam: Vitals:   06/04/22 0835  BP: (!) 150/80  Pulse: 88  Weight: 95.3 kg  Height: '6\' 2"'$  (1.88 m)   Wt Readings from Last 3 Encounters:  06/04/22 95.3 kg  06/01/22 95.3 kg  05/19/22 93.4 kg    Labs: Lab Results  Component Value Date   NA 135 06/01/2022   K 4.4 06/01/2022   CL 101 06/01/2022   CO2 23 06/01/2022   GLUCOSE 177 (H) 06/01/2022   BUN 15 06/01/2022   CREATININE 0.75 06/01/2022   CALCIUM 10.4 (H) 06/01/2022   MG 1.9 03/06/2020   No results found for: "INR" Lab Results  Component Value Date   CHOL 211 (H) 01/12/2022   HDL 34.70 (L) 01/12/2022   LDLCALC 133 (H) 04/26/2017   TRIG 295.0 (H) 01/12/2022    GEN- The patient is well appearing, alert and oriented x 3 today.   HEENT-head normocephalic, atraumatic, sclera clear, conjunctiva pink, hearing intact, trachea midline. Lungs- Clear to ausculation bilaterally, normal work of breathing Heart- Regular rate and rhythm, no murmurs, rubs or gallops  GI- soft, NT, ND, +  BS Extremities- no clubbing, cyanosis, or edema MS- no significant deformity or atrophy Skin- no rash or lesion Psych- euthymic mood, full affect Neuro- strength and sensation are  intact  EKG- Vent. rate 88 BPM PR interval 148 ms QRS duration 74 ms QT/QTcB 374/452 ms P-R-T axes 64 54 49 Normal sinus rhythm Normal ECG When compared with ECG of 01-Jun-2022 14:05, PREVIOUS ECG IS PRESENT  Echo- 02/09/19  1. Left ventricular ejection fraction, by visual estimation, is 60 to 65%. The left ventricle has normal function. Normal left ventricular size. There is mildly increased left ventricular hypertrophy.  2. Left ventricular diastolic Doppler parameters are indeterminate pattern of LV diastolic filling.  3. Global right ventricle has normal systolic function.The right ventricular size is normal. No increase in right ventricular wall thickness.  4. Left atrial size was moderately dilated.  5. Right atrial size was moderately dilated.  6. The mitral valve is normal in structure. Mild mitral valve regurgitation. No evidence of mitral stenosis.  7. The tricuspid valve is normal in structure. Tricuspid valve regurgitation was not visualized by color flow Doppler.  8. The aortic valve is normal in structure. Aortic valve regurgitation was not visualized by color flow Doppler. Structurally normal aortic valve, with no evidence of sclerosis or stenosis.  9. The pulmonic valve was normal in structure. Pulmonic valve regurgitation is not visualized by color flow Doppler. 10. The inferior vena cava is normal in size with greater than 50% respiratory variability, suggesting right atrial pressure of 3 mmHg.   Assessment and Plan: 1. Aflutter  S/p afib and flutter ablation 03/31/19. 2nd ablation 2022 Patient appeared to be maintaining SR until recently in the setting of an URI with very frequent use of albuterol  Presented to the ED for treatment of ear fullness and was found to be in atrial flutter  Converted a short time later at  home  Continue  diltiazem 180 mg daily and does not require to be on daily BB as prescribed from ED as he has converted to SR By guidelines he will  not require to be on  Xarelto 20 mg for CHA2DS2VASc of 1.  This patients CHA2DS2-VASc Score and unadjusted Ischemic Stroke Rate (% per year) is equal to 0.6 % stroke rate/year from a score of 1  Above score calculated as 1 point each if present [CHF, HTN, DM, Vascular=MI/PAD/Aortic Plaque, Age if 65-74, or Male] Above score calculated as 2 points each if present [Age > 75, or Stroke/TIA/TE]  2. HTN  Elevated today Encouraged  to check at home for truest BP readings    Afib clinic as needed  Is planning to establish with new EP in Dr. Jackalyn Lombard absence    Riley Moore, [facility] [address] [place], [place] [address] [phone]

## 2022-06-11 ENCOUNTER — Encounter: Payer: Self-pay | Admitting: Family Medicine

## 2022-06-11 ENCOUNTER — Encounter (HOSPITAL_COMMUNITY): Payer: Self-pay | Admitting: *Deleted

## 2022-07-10 ENCOUNTER — Ambulatory Visit: Payer: BC Managed Care – PPO | Admitting: Family Medicine

## 2022-07-10 ENCOUNTER — Encounter: Payer: Self-pay | Admitting: Family Medicine

## 2022-07-10 VITALS — BP 125/86 | HR 74 | Temp 97.8°F | Ht 74.0 in | Wt 208.2 lb

## 2022-07-10 DIAGNOSIS — I10 Essential (primary) hypertension: Secondary | ICD-10-CM

## 2022-07-10 DIAGNOSIS — R7401 Elevation of levels of liver transaminase levels: Secondary | ICD-10-CM | POA: Diagnosis not present

## 2022-07-10 DIAGNOSIS — E7800 Pure hypercholesterolemia, unspecified: Secondary | ICD-10-CM

## 2022-07-10 DIAGNOSIS — E119 Type 2 diabetes mellitus without complications: Secondary | ICD-10-CM | POA: Diagnosis not present

## 2022-07-10 LAB — COMPREHENSIVE METABOLIC PANEL
ALT: 90 U/L — ABNORMAL HIGH (ref 0–53)
AST: 72 U/L — ABNORMAL HIGH (ref 0–37)
Albumin: 4.1 g/dL (ref 3.5–5.2)
Alkaline Phosphatase: 55 U/L (ref 39–117)
BUN: 14 mg/dL (ref 6–23)
CO2: 31 mEq/L (ref 19–32)
Calcium: 9.8 mg/dL (ref 8.4–10.5)
Chloride: 99 mEq/L (ref 96–112)
Creatinine, Ser: 0.73 mg/dL (ref 0.40–1.50)
GFR: 100.74 mL/min (ref >60.00)
Glucose, Bld: 241 mg/dL — ABNORMAL HIGH (ref 70–99)
Potassium: 4.4 mEq/L (ref 3.5–5.1)
Sodium: 138 mEq/L (ref 135–145)
Total Bilirubin: 0.5 mg/dL (ref 0.2–1.2)
Total Protein: 7.1 g/dL (ref 6.0–8.3)

## 2022-07-10 LAB — LIPID PANEL
Cholesterol: 218 mg/dL — ABNORMAL HIGH (ref 0–200)
HDL: 38.5 mg/dL — ABNORMAL LOW (ref >39.00)
LDL Cholesterol: 144 mg/dL — ABNORMAL HIGH (ref 0–99)
NonHDL: 179.7
Total CHOL/HDL Ratio: 6
Triglycerides: 181 mg/dL — ABNORMAL HIGH (ref 0.0–149.0)
VLDL: 36.2 mg/dL (ref 0.0–40.0)

## 2022-07-10 LAB — MICROALBUMIN / CREATININE URINE RATIO
Creatinine,U: 88.8 mg/dL
Microalb Creat Ratio: 17.2 mg/g (ref 0.0–30.0)
Microalb, Ur: 15.2 mg/dL — ABNORMAL HIGH (ref 0.0–1.9)

## 2022-07-10 LAB — POCT GLYCOSYLATED HEMOGLOBIN (HGB A1C)
HbA1c POC (<> result, manual entry): 8 % (ref 4.0–5.6)
HbA1c, POC (controlled diabetic range): 8 % — AB (ref 0.0–7.0)
HbA1c, POC (prediabetic range): 8 % — AB (ref 5.7–6.4)
Hemoglobin A1C: 8 % — AB (ref 4.0–5.6)

## 2022-07-10 MED ORDER — ESCITALOPRAM OXALATE 10 MG PO TABS: 10.0000 mg | ORAL_TABLET | Freq: Every day | ORAL | 1 refills | Status: DC

## 2022-07-10 MED ORDER — METFORMIN HCL 500 MG PO TABS: ORAL_TABLET | ORAL | 1 refills | Status: DC

## 2022-07-10 MED ORDER — LISINOPRIL 40 MG PO TABS: ORAL_TABLET | ORAL | 1 refills | Status: DC

## 2022-07-10 NOTE — Progress Notes (Signed)
OFFICE VISIT  07/10/2022  CC:  Chief Complaint  Patient presents with   Medical Management of Chronic Issues    Pt is fasting    Patient is a 57 y.o. male who presents for 83-monthfollow-up diabetes, hypertension, and hyperlipidemia. A/P as of last visit: "#1 diabetes without complication. POC Hba1c today is 5.3%! Cut metformin back to 1000 mg qd. He will return for urine microalbumin/creatinine as well as the remainder of his health panel when fasting.   #2 hypertension, well controlled on lisinopril 40 mg a day. Electrolytes and creatinine to be done soon when fasting.   3.  Hyperlipidemia, history of intolerance to pravastatin, possibly others. Patient has seen Dr. HDebara Pickettfor lipid management in the past and rosuvastatin 20 mg was recommended.  Patient does not recall ever doing this. LDL goal less than 100. Repeat lipids soon when fasting and if LDL greater than 100 we will proceed with trial of low-dose rosuvastatin and work her way up as needed.  I do not know if he would qualify for PCSK9 inhibitor but we can get him back to Dr. HDebara Pickettto see about this if necessary.   #4 atrial fibrillation.  He is status post successful ablation about 1 year ago. Maintaining sinus rhythm. He continues on Cardizem 180 mg a day."  INTERIM HX: Feeling well. After lab results in September of last year I recommended he start rosuvastatin. Also recommended liver ultrasound due to persistently elevated LFTs--this was not done.  He forgot to start taking his crestor.   Backslid on diet. Not working out much since heel injury Nov 2023. Heel is fine now. No home gluc monitoring.  ROS as above, plus--> no fevers, no CP, no SOB, no wheezing, no cough, no dizziness, no HAs, no rashes, no melena/hematochezia.  No polyuria or polydipsia.  No myalgias or arthralgias.  No focal weakness, paresthesias, or tremors.  No acute vision or hearing abnormalities.  No dysuria or unusual/new urinary urgency or  frequency.  No recent changes in lower legs. No n/v/d or abd pain.  No palpitations.    Past Medical History:  Diagnosis Date   Anxiety attack    Diabetes mellitus ([place] 07/2019   prediab 2015.  DM 2 dx 07/2019   Elevated transaminase level    suspect combo of hyperlip/fatty liver + alcohol.  Viral Hep panel neg 2016.   Gout    multiple episodes (L MTP jt)   History of gluten intolerance    Hyperlipemia    Intolerant of statins: tolerates welchol   Hypertension    Left foot pain 2020   cavus foot type resulting in midfoot capsulitis; posterior heel spur; arthritis first MPJ   Perennial allergic rhinitis    Persistent atrial fibrillation ([place] 2013; 2019   Dr. HPercival Spanish  Spont conv in ED.  Outpt eval showed nl ECHO--ASA qd and BB/CCB started.  Multiple cardioversions and recurrences, finally got ablation procedure.03/2019--no [place]/c CHADVASC score low. Recurrence->ablation again 02/2021   Peyronie's disease     Past Surgical History:  Procedure Laterality Date   ATRIAL FIBRILLATION ABLATION N/A 03/31/2019   Procedure: ATRIAL FIBRILLATION ABLATION;  Surgeon: AThompson Grayer MD;  [facility];  Service: Cardiovascular;  Laterality: N/A;   ATRIAL FIBRILLATION ABLATION N/A 02/06/2021   Procedure: ATRIAL FIBRILLATION ABLATION;  Surgeon: AThompson Grayer MD;  [facility];  Service: Cardiovascular;  Laterality: N/A;   CARDIOVERSION N/A 06/11/2017   Procedure: CARDIOVERSION;  Surgeon: RSkeet Latch MD;  Location: [place];  Service: Cardiovascular;  Laterality: N/A;   CARDIOVERSION N/A 01/23/2019   Procedure: CARDIOVERSION;  Surgeon: Buford Dresser, MD;  Location: [place];  Service: Cardiovascular;  Laterality: N/A;   CARDIOVERSION N/A 05/24/2020   Procedure: CARDIOVERSION;  Surgeon: Jerline Pain, MD;  Location: [facility];  Service: Cardiovascular;  Laterality: N/A;   COLONOSCOPY  06/27/2021   06/2021->2 adenomas->recall 3 yrs per GI  MD   DC cardioversion  06/11/2017   was back in A-fib within 5 min of waking up.   TRANSTHORACIC ECHOCARDIOGRAM  04/2012   2013-NORMAL (ED 55-65%).  01/2021 EF 60-65%, valves normal. 12/2020 EF 65-70%, normal.    Outpatient Medications Prior to Visit  Medication Sig Dispense Refill   cetirizine (ZYRTEC) 10 MG tablet Take 10 mg by mouth daily.     clotrimazole-betamethasone (LOTRISONE) cream APPLY TO AFFECTED AREA TWICE A DAY 30 g 1   diltiazem (CARDIZEM CD) 180 MG 24 hr capsule TAKE 1 CAPSULE BY MOUTH EVERY DAY 90 capsule 3   metoprolol succinate (TOPROL-XL) 25 MG 24 hr tablet Take 1 tablet (25 mg total) by mouth daily. 15 tablet 0   metFORMIN (GLUCOPHAGE) 500 MG tablet TAKE 1 TAB BY MOUTH EVERY MORNING     DM-APAP-CPM (CORICIDIN HBP PO) Take by mouth. Takng 3-4 tablets by mouth daily as needed (Patient not taking: Reported on 07/10/2022)     ipratropium (ATROVENT) 0.06 % nasal spray Place 2 sprays into both nostrils 4 (four) times daily. (Patient not taking: Reported on 07/10/2022) 15 mL 12   rosuvastatin (CRESTOR) 5 MG tablet TAKE 1 TABLET (5 MG TOTAL) BY MOUTH DAILY. (Patient not taking: Reported on 07/10/2022) 90 tablet 0   escitalopram (LEXAPRO) 10 MG tablet Take 1 tablet (10 mg total) by mouth daily. OFFICE VISIT NEEDED FOR FURTHER REFILLS 90 tablet 1   lisinopril (ZESTRIL) 40 MG tablet TAKE 1 TABLET BY MOUTH EVERYDAY AT BEDTIME 90 tablet 1   rivaroxaban (XARELTO) 20 MG TABS tablet Take 1 tablet (20 mg total) by mouth daily with supper. (Patient not taking: Reported on 07/10/2022) 90 tablet 0   No facility-administered medications prior to visit.    Allergies  Allergen Reactions   Acetaminophen Diarrhea   Pravastatin     Decreased energy, "wiped out"    Review of Systems As per HPI  PE:    07/10/2022    8:15 AM 06/04/2022    8:35 AM 06/01/2022    5:45 PM  Vitals with BMI  Height '6\' 2"'$  '6\' 2"'$    Weight 208 lbs 3 oz 210 lbs   BMI [id number]   Systolic 0000000 [id number] [id number]  Diastolic 86 80 94   Pulse 74 88 89     Physical Exam  Gen: Alert, well appearing.  Patient is oriented to person, place, time, and situation. AFFECT: pleasant, lucid thought and speech. CV: RRR, no m/r/g.   LUNGS: CTA bilat, nonlabored resps, good aeration in all lung fields.   LABS:  Last CBC Lab Results  Component Value Date   WBC 9.4 06/01/2022   HGB 15.8 06/01/2022   HCT 45.8 06/01/2022   MCV 91.1 06/01/2022   MCH 31.4 06/01/2022   RDW 12.5 06/01/2022   PLT 210 [id number]   Last metabolic panel Lab Results  Component Value Date   GLUCOSE 177 (H) 06/01/2022   NA 135 06/01/2022   K 4.4 06/01/2022   CL 101 06/01/2022   CO2 23 06/01/2022   BUN 15 06/01/2022   CREATININE  0.75 06/01/2022   GFRNONAA >60 06/01/2022   CALCIUM 10.4 (H) 06/01/2022   PROT 8.0 06/01/2022   ALBUMIN 4.4 06/01/2022   BILITOT 0.6 06/01/2022   ALKPHOS 60 06/01/2022   AST 67 (H) 06/01/2022   ALT 144 (H) 06/01/2022   ANIONGAP 11 06/01/2022   Last lipids Lab Results  Component Value Date   CHOL 211 (H) 01/12/2022   HDL 34.70 (L) 01/12/2022   LDLCALC 133 (H) 04/26/2017   LDLDIRECT 131.0 01/12/2022   TRIG 295.0 (H) 01/12/2022   CHOLHDL 6 01/12/2022   Last hemoglobin A1c Lab Results  Component Value Date   HGBA1C 8.0 (A) 07/10/2022   HGBA1C 8.0 07/10/2022   HGBA1C 8.0 (A) 07/10/2022   HGBA1C 8.0 (A) 07/10/2022   Last thyroid functions Lab Results  Component Value Date   TSH 2.32 01/12/2022   IMPRESSION AND PLAN:  #1 diabetes without complication, poor control. Point-of-care hemoglobin A1c today is 8.0%. Increase metformin to 1000 mg twice a day and he will get more strict about his diabetic diet.  He will also restart exercising. Urine microalbumin/creatinine today.  2.  Hypertension, well-controlled on lisinopril 40 mg daily and Toprol-XL 25 mg daily.  #3 hyperlipidemia, LDL goal less than 70. Prescribed rosuvastatin 6 months ago but he forgot to take it.  He does have the medication at home,  though. Recheck lipids today and go from there.  #4 elevated transaminases. Viral hepatitis serology negative. History of moderate beer intake. Patient states ultrasound in the remote past did show fatty liver. Limited right upper quadrant ultrasound ordered today.  #5 atrial fibrillation.  Maintaining sinus rhythm. He continues on Cardizem 180 mg a day. No DOAC b/c CHADVASc score is 1.  An After Visit Summary was printed and given to the patient.  FOLLOW UP: Return in about 3 months (around 10/10/2022) for f/u DM.  Signed:  Crissie Sickles, MD           07/10/2022

## 2022-07-10 NOTE — Patient Instructions (Signed)
Increase your metformin to TWO 500 mg tabs twice a day.  You'll get a call to arrange your liver ultrasound.

## 2022-08-17 ENCOUNTER — Encounter: Payer: Self-pay | Admitting: *Deleted

## 2022-09-29 ENCOUNTER — Telehealth: Payer: Self-pay | Admitting: Cardiology

## 2022-09-29 NOTE — Telephone Encounter (Signed)
   Pre-operative Risk Assessment    Patient Name: Riley Moore  DOB: 1965-08-30 MRN: [id number]     Request for Surgical Clearance    Procedure:  Dental Extraction - Amount of Teeth to be Pulled:  1, Extraction of #14 an bone grafting  Date of Surgery:  Clearance 10/02/22                                 Surgeon:  Dr. Lincoln Brigham Surgeon's Group or Practice Name:  [facility] Phone number:  [phone]  Fax number:  [phone]   Type of Clearance Requested:   - Medical    Type of Anesthesia:   IV Sedation   Additional requests/questions:   Caller stated patient will need to have an urgent extraction and will be faxing clearance request.  Signed, Annetta Maw   09/29/2022, 4:26 PM

## 2022-09-29 NOTE — Telephone Encounter (Signed)
Left message to call back to set up tele pre op appt. Per pre op APP ok to add on 09/30/22 in the PM.

## 2022-09-29 NOTE — Telephone Encounter (Signed)
   Name: Riley Moore  DOB: 1966-04-10  MRN: [id number]  Primary Cardiologist: Rollene Rotunda, MD   Preoperative team, please contact this patient and set up a phone call appointment for further preoperative risk assessment. Please obtain consent and complete medication review. Thank you for your help.  I confirm that guidance regarding antiplatelet and oral anticoagulation therapy has been completed and, if necessary, noted below.  None requested and can be put on schedule for 09/30/22 in the afternoon   Napoleon Form, Leodis Rains, NP 09/29/2022, 4:37 PM [facility]

## 2022-09-30 ENCOUNTER — Telehealth: Payer: Self-pay | Admitting: *Deleted

## 2022-09-30 NOTE — Telephone Encounter (Signed)
Ok per pre op APP today, if we reach pt today he can be added tomorrow to pre op sched, as sched is full for today.

## 2022-09-30 NOTE — Telephone Encounter (Signed)
Per pre op APP today said to add pt to pre op schedule for tomorrow as we had not been able to reach the pt. Pt agreeable to tele pre op appt 10/01/22 @ 1:40. Med rec and consent are done.     I will update all parties involved pt has tele appt 10/01/22 @ 1:40 for pre op clearance.

## 2022-09-30 NOTE — Telephone Encounter (Signed)
Per pre op APP today said to add pt to pre op schedule for tomorrow as we had not been able to reach the pt. Pt agreeable to tele pre op appt 10/01/22 @ 1:40. Med rec and consent are done.     Patient Consent for Virtual Visit        Riley Moore has provided verbal consent on 09/30/2022 for a virtual visit (video or telephone).   CONSENT FOR VIRTUAL VISIT FOR:  Riley Moore  By participating in this virtual visit I agree to the following:  I hereby voluntarily request, consent and authorize [facility] and its employed or contracted physicians, physician assistants, nurse practitioners or other licensed health care professionals (the Practitioner), to provide me with telemedicine health care services (the "Services") as deemed necessary by the treating Practitioner. I acknowledge and consent to receive the Services by the Practitioner via telemedicine. I understand that the telemedicine visit will involve communicating with the Practitioner through live audiovisual communication technology and the disclosure of certain medical information by electronic transmission. I acknowledge that I have been given the opportunity to request an in-person assessment or other available alternative prior to the telemedicine visit and am voluntarily participating in the telemedicine visit.  I understand that I have the right to withhold or withdraw my consent to the use of telemedicine in the course of my care at any time, without affecting my right to future care or treatment, and that the Practitioner or I may terminate the telemedicine visit at any time. I understand that I have the right to inspect all information obtained and/or recorded in the course of the telemedicine visit and may receive copies of available information for a reasonable fee.  I understand that some of the potential risks of receiving the Services via telemedicine include:  Delay or interruption in medical evaluation due to  technological equipment failure or disruption; Information transmitted may not be sufficient (e.g. poor resolution of images) to allow for appropriate medical decision making by the Practitioner; and/or  In rare instances, security protocols could fail, causing a breach of personal health information.  Furthermore, I acknowledge that it is my responsibility to provide information about my medical history, conditions and care that is complete and accurate to the best of my ability. I acknowledge that Practitioner's advice, recommendations, and/or decision may be based on factors not within their control, such as incomplete or inaccurate data provided by me or distortions of diagnostic images or specimens that may result from electronic transmissions. I understand that the practice of medicine is not an exact science and that Practitioner makes no warranties or guarantees regarding treatment outcomes. I acknowledge that a copy of this consent can be made available to me via my patient portal [facility] MyChart), or I can request a printed copy by calling the office of  [facility].    I understand that my insurance will be billed for this visit.   I have read or had this consent read to me. I understand the contents of this consent, which adequately explains the benefits and risks of the Services being provided via telemedicine.  I have been provided ample opportunity to ask questions regarding this consent and the Services and have had my questions answered to my satisfaction. I give my informed consent for the services to be provided through the use of telemedicine in my medical care

## 2022-10-01 ENCOUNTER — Ambulatory Visit: Payer: BC Managed Care – PPO | Attending: Cardiology

## 2022-10-01 DIAGNOSIS — Z01810 Encounter for preprocedural cardiovascular examination: Secondary | ICD-10-CM | POA: Diagnosis not present

## 2022-10-01 NOTE — Progress Notes (Signed)
Virtual Visit via Telephone Note   Because of Riley Moore's co-morbid illnesses, he is at least at moderate risk for complications without adequate follow up.  This format is felt to be most appropriate for this patient at this time.  The patient did not have access to video technology/had technical difficulties with video requiring transitioning to audio format only (telephone).  All issues noted in this document were discussed and addressed.  No physical exam could be performed with this format.  Please refer to the patient's chart for his consent to telehealth for [facility].  Evaluation Performed:  Preoperative cardiovascular risk assessment _____________   Date:  10/01/2022   Patient ID:  Riley Moore, DOB 09-May-1965, MRN [id number] Patient Location:  Home Provider location:   Office  Primary Care Provider:  Jeoffrey Massed, MD Primary Cardiologist:  Rollene Rotunda, MD  Chief Complaint / Patient Profile   57 y.o. y/o male with a h/o hyperlipidemia, hypertension, atrial flutter/fibrillation who is pending 1 dental extraction and bone grafting and presents today for telephonic preoperative cardiovascular risk assessment.  History of Present Illness    Riley Moore is a 57 y.o. male who presents via audio/video conferencing for a telehealth visit today.  Pt was last seen in cardiology clinic on 06/04/2022 by Rudi Coco, NP.  At that time JAMAS SEIGLE was doing well .  The patient is now pending procedure as outlined above. Since his last visit, he remains stable from a cardiac standpoint.  Today he denies chest pain, shortness of breath, lower extremity edema, fatigue, palpitations, melena, hematuria, hemoptysis, diaphoresis, weakness, presyncope, syncope, orthopnea, and PND.   Past Medical History    Past Medical History:  Diagnosis Date   Anxiety attack    Diabetes mellitus (HCC) 07/2019   prediab 2015.  DM 2 dx 07/2019   Elevated transaminase level    suspect  combo of hyperlip/fatty liver + alcohol.  Viral Hep panel neg 2016.   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 Antoine Poche.  Spont conv in ED.  Outpt eval showed nl ECHO--ASA qd and BB/CCB started.  Multiple cardioversions and recurrences, finally got ablation procedure.03/2019--no OAC b/c CHADVASC score low. Recurrence->ablation again 02/2021   Peyronie's disease    Past Surgical History:  Procedure Laterality Date   ATRIAL FIBRILLATION ABLATION N/A 03/31/2019   Procedure: ATRIAL FIBRILLATION ABLATION;  Surgeon: Hillis Range, MD;  [facility] IN[facility];  Service: Cardiovascular;  Laterality: N/A;   ATRIAL FIBRILLATION ABLATION N/A 02/06/2021   Procedure: ATRIAL FIBRILLATION ABLATION;  Surgeon: Hillis Range, MD;  Location: [facility];  Service: Cardiovascular;  Laterality: N/A;   CARDIOVERSION N/A 06/11/2017   Procedure: CARDIOVERSION;  Surgeon: Chilton Si, MD;  Location: [facility] ENDOSCOPY;  Service: Cardiovascular;  Laterality: N/A;   CARDIOVERSION N/A 01/23/2019   Procedure: CARDIOVERSION;  Surgeon: Jodelle Red, MD;  Location: [facility] ENDOSCOPY;  Service: Cardiovascular;  Laterality: N/A;   CARDIOVERSION N/A 05/24/2020   Procedure: CARDIOVERSION;  Surgeon: Jake Bathe, MD;  Location[facility] ENDOSCOPY;  Service: Cardiovascular;  Laterality: N/A;   COLONOSCOPY  06/27/2021   06/2021->2 adenomas->recall  3 yrs per GI MD   DC cardioversion  06/11/2017   was back in A-fib within 5 min of waking up.   TRANSTHORACIC ECHOCARDIOGRAM  04/2012   2013-NORMAL (ED 55-65%).  01/2021 EF 60-65%, valves normal. 12/2020 EF 65-70%, normal.    Allergies  Allergies  Allergen Reactions    Acetaminophen Diarrhea   Pravastatin     Decreased energy, "wiped out"    Home Medications    Prior to Admission medications   Medication Sig Start Date End Date Taking? Authorizing Provider  amoxicillin-clavulanate (AUGMENTIN) 875-125 MG tablet Take 1 tablet by mouth 2 (two) times daily. 09/25/22   [provider]  cetirizine (ZYRTEC) 10 MG tablet Take 10 mg by mouth daily.    [provider]  clotrimazole-betamethasone (LOTRISONE) cream APPLY TO AFFECTED AREA TWICE A DAY 01/09/22   McGowen, Maryjean Morn, MD  diltiazem (CARDIZEM CD) 180 MG 24 hr capsule TAKE 1 CAPSULE BY MOUTH EVERY DAY 07/14/21   Rollene Rotunda, MD  DM-APAP-CPM (CORICIDIN HBP PO) Take by mouth. Takng 3-4 tablets by mouth daily as needed Patient not taking: Reported on 07/10/2022    [provider]  escitalopram (LEXAPRO) 10 MG tablet Take 1 tablet (10 mg total) by mouth daily. 07/10/22   McGowen, Maryjean Morn, MD  ipratropium (ATROVENT) 0.06 % nasal spray Place 2 sprays into both nostrils 4 (four) times daily. Patient not taking: Reported on 07/10/2022 05/19/22   Felix Pacini A, DO  lisinopril (ZESTRIL) 40 MG tablet TAKE 1 TABLET BY MOUTH EVERYDAY AT BEDTIME 07/10/22   McGowen, Maryjean Morn, MD  metFORMIN (GLUCOPHAGE) 500 MG tablet TAKE 2 TABS BY MOUTH bid 07/10/22   McGowen, Maryjean Morn, MD  metoprolol succinate (TOPROL-XL) 25 MG 24 hr tablet Take 1 tablet (25 mg total) by mouth daily. 06/01/22   Sherian Maroon A, PA  rosuvastatin (CRESTOR) 5 MG tablet TAKE 1 TABLET (5 MG TOTAL) BY MOUTH DAILY. Patient not taking: Reported on 07/10/2022 04/09/22   Jeoffrey Massed, MD    Physical Exam    Vital Signs:  LADARRIS IHA does not have vital signs available for review today.  Given telephonic nature of communication, physical exam is limited. AAOx3. NAD. Normal affect.  Speech and respirations are unlabored.  Accessory Clinical Findings    None  Assessment & Plan    1.  Preoperative Cardiovascular Risk Assessment: 1  tooth to be extracted with bone grafting, Dr. Lincoln Brigham, oral surgery of the [place], fax number [phone]      Primary Cardiologist: Rollene Rotunda, MD  Chart reviewed as part of pre-operative protocol coverage. Given past medical history and time since last visit, based on ACC/AHA guidelines, TAELON DELROSARIO would be at acceptable risk for the planned procedure without further cardiovascular testing.   Patient was advised that if he develops new symptoms prior to surgery to contact our office to arrange a follow-up appointment.  He verbalized understanding.  I will route this recommendation to the requesting party via Epic fax function and remove from pre-op pool.       Time:   Today, I have spent 5 minutes with the patient with telehealth technology discussing medical history, symptoms, and management plan.  Prior to his phone evaluation I spent greater than 10 minutes reviewing his past medical history and cardiac medications.   Ronney Asters, NP  10/01/2022, 7:32 AM

## 2022-10-02 ENCOUNTER — Other Ambulatory Visit: Payer: Self-pay | Admitting: Cardiology

## 2022-10-06 ENCOUNTER — Other Ambulatory Visit: Payer: Self-pay | Admitting: Family Medicine

## 2022-11-06 ENCOUNTER — Other Ambulatory Visit: Payer: Self-pay | Admitting: Family Medicine

## 2023-02-04 ENCOUNTER — Other Ambulatory Visit: Payer: Self-pay | Admitting: Family Medicine

## 2023-02-11 ENCOUNTER — Telehealth: Payer: Self-pay | Admitting: Family Medicine

## 2023-02-11 MED ORDER — METFORMIN HCL 500 MG PO TABS: ORAL_TABLET | ORAL | 0 refills | Status: DC

## 2023-02-11 MED ORDER — LISINOPRIL 40 MG PO TABS: ORAL_TABLET | ORAL | 0 refills | Status: DC

## 2023-02-11 MED ORDER — ESCITALOPRAM OXALATE 10 MG PO TABS: 10.0000 mg | ORAL_TABLET | Freq: Every day | ORAL | 0 refills | Status: DC

## 2023-02-11 NOTE — Telephone Encounter (Signed)
Refills sent

## 2023-02-11 NOTE — Telephone Encounter (Signed)
Prescription Request  02/11/2023  LOV: 07/10/2022 Patient is scheduled for 10/17 he is out of medications. [facility] [address]   What is the name of the medication or equipment? lisinopril (ZESTRIL) 40 MG tablet , escitalopram (LEXAPRO) 10 MG tablet metFORMIN (GLUCOPHAGE) 500 MG tablet   Have you contacted your pharmacy to request a refill? Yes   Which pharmacy would you like this sent to?  [id number] [place], [place] - [address] [place] [address] Phone: [phone] Fax: [phone]  [id number] [place] - [address] [facility] [address] [place] [address] Phone: [phone] Fax: [phone]    Patient notified that their request is being sent to the clinical staff for review and that they should receive a response within 2 business days.   Please advise at Mobile [phone] (mobile)

## 2023-02-18 ENCOUNTER — Ambulatory Visit: Payer: BC Managed Care – PPO | Admitting: Family Medicine

## 2023-02-18 ENCOUNTER — Encounter: Payer: Self-pay | Admitting: Family Medicine

## 2023-02-18 VITALS — BP 132/82 | HR 67 | Wt 202.4 lb

## 2023-02-18 DIAGNOSIS — I10 Essential (primary) hypertension: Secondary | ICD-10-CM

## 2023-02-18 DIAGNOSIS — E7800 Pure hypercholesterolemia, unspecified: Secondary | ICD-10-CM | POA: Diagnosis not present

## 2023-02-18 DIAGNOSIS — Z125 Encounter for screening for malignant neoplasm of prostate: Secondary | ICD-10-CM | POA: Diagnosis not present

## 2023-02-18 DIAGNOSIS — Z23 Encounter for immunization: Secondary | ICD-10-CM | POA: Diagnosis not present

## 2023-02-18 DIAGNOSIS — R7401 Elevation of levels of liver transaminase levels: Secondary | ICD-10-CM

## 2023-02-18 DIAGNOSIS — Z7984 Long term (current) use of oral hypoglycemic drugs: Secondary | ICD-10-CM

## 2023-02-18 DIAGNOSIS — E119 Type 2 diabetes mellitus without complications: Secondary | ICD-10-CM

## 2023-02-18 DIAGNOSIS — Z0000 Encounter for general adult medical examination without abnormal findings: Secondary | ICD-10-CM

## 2023-02-18 LAB — LIPID PANEL
Cholesterol: 174 mg/dL (ref 0–200)
HDL: 27.9 mg/dL — ABNORMAL LOW (ref >39.00)
LDL Cholesterol: 102 mg/dL — ABNORMAL HIGH (ref 0–99)
NonHDL: 146.59
Total CHOL/HDL Ratio: 6
Triglycerides: 221 mg/dL — ABNORMAL HIGH (ref 0.0–149.0)
VLDL: 44.2 mg/dL — ABNORMAL HIGH (ref 0.0–40.0)

## 2023-02-18 LAB — COMPREHENSIVE METABOLIC PANEL
ALT: 109 U/L — ABNORMAL HIGH (ref 0–53)
AST: 131 U/L — ABNORMAL HIGH (ref 0–37)
Albumin: 4.1 g/dL (ref 3.5–5.2)
Alkaline Phosphatase: 71 U/L (ref 39–117)
BUN: 12 mg/dL (ref 6–23)
CO2: 28 meq/L (ref 19–32)
Calcium: 9.5 mg/dL (ref 8.4–10.5)
Chloride: 96 meq/L (ref 96–112)
Creatinine, Ser: 0.71 mg/dL (ref 0.40–1.50)
GFR: 101.16 mL/min (ref >60.00)
Glucose, Bld: 292 mg/dL — ABNORMAL HIGH (ref 70–99)
Potassium: 4.4 meq/L (ref 3.5–5.1)
Sodium: 133 meq/L — ABNORMAL LOW (ref 135–145)
Total Bilirubin: 1 mg/dL (ref 0.2–1.2)
Total Protein: 7.1 g/dL (ref 6.0–8.3)

## 2023-02-18 LAB — POCT GLYCOSYLATED HEMOGLOBIN (HGB A1C)
HbA1c POC (<> result, manual entry): 9.3 % (ref 4.0–5.6)
HbA1c, POC (controlled diabetic range): 9.3 % — AB (ref 0.0–7.0)
HbA1c, POC (prediabetic range): 9.3 % — AB (ref 5.7–6.4)
Hemoglobin A1C: 9.3 % — AB (ref 4.0–5.6)

## 2023-02-18 LAB — PSA: PSA: 0.29 ng/mL (ref 0.10–4.00)

## 2023-02-18 MED ORDER — ROSUVASTATIN CALCIUM 5 MG PO TABS: 5.0000 mg | ORAL_TABLET | Freq: Every day | ORAL | 1 refills | Status: DC

## 2023-02-18 NOTE — Progress Notes (Signed)
Office Note 02/18/2023  CC:  Chief Complaint  Patient presents with   Medical Management of Chronic Issues   Patient is a 57 y.o. male who is here for 13-month follow-up diabetes, hyperlipidemia, and hypertension. A/P as of last visit: "#1 diabetes without complication, poor control. Point-of-care hemoglobin A1c today is 8.0%. Increase metformin to 1000 mg twice a day and he will get more strict about his diabetic diet.  He will also restart exercising. Urine microalbumin/creatinine today.   2.  Hypertension, well-controlled on lisinopril 40 mg daily and Toprol-XL 25 mg daily.   #3 hyperlipidemia, LDL goal less than 70. Prescribed rosuvastatin 6 months ago but he forgot to take it.  He does have the medication at home, though. Recheck lipids today and go from there.   #4 elevated transaminases. Viral hepatitis serology negative. History of moderate beer intake. Patient states ultrasound in the remote past did show fatty liver. Limited right upper quadrant ultrasound ordered today.   #5 atrial fibrillation.  Maintaining sinus rhythm. He continues on Cardizem 180 mg a day. No DOAC b/c CHADVASc score is 1."  INTERIM HX: Riley Moore states he feels very well. Not exercising vigorously very much lately.  He does walk. His diet has not been very good lately. He does not check glucose anymore at home.  No blood pressure checks at home either.  He says he can usually feel when his blood pressure has gone up and he has not had this sensation in quite a long time.  He takes two 500 mg metformin tabs twice a day.  Past Medical History:  Diagnosis Date   Anxiety attack    Diabetes mellitus (HCC) 07/2019   prediab 2015.  DM 2 dx 07/2019   Elevated transaminase level    suspect combo of hyperlip/fatty liver + alcohol.  Viral Hep panel neg 2016.   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 Antoine Poche.  Spont conv in ED.  Outpt eval showed nl ECHO--ASA qd and BB/CCB started.  Multiple cardioversions and recurrences, finally got ablation procedure.03/2019--no OAC b/c CHADVASC score low. Recurrence->ablation again 02/2021   Peyronie's disease     Past Surgical History:  Procedure Laterality Date   ATRIAL FIBRILLATION ABLATION N/A 03/31/2019   Procedure: ATRIAL FIBRILLATION ABLATION;  Surgeon: Hillis Range, MD;  Location: [facility] INVASIVE CV LAB;  Service: Cardiovascular;  Laterality: N/A;   ATRIAL FIBRILLATION ABLATION N/A 02/06/2021   Procedure: ATRIAL FIBRILLATION ABLATION;  Surgeon: Hillis Range, MD;  Location: [facility] INVAS[facility] LAB;  Service: Cardiovascular;  Laterality: N/A;   CARDIOVERSION N/A 06/11/2017   Procedure: CARDIOVERSION;  Surgeon: Chilton Si, MD;  Location: [facility] ENDOSCOPY;  Service: Cardiovascular;  Laterality: N/A;   CARDIOVERSION N/A 01/23/2019   Procedure: CARDIOVERSION;  Surgeon: Jodelle Red, MD;  Location[facility] ENDOSCOPY;  Service: Cardiovascular;  Laterality: N/A;   CARDIOVERSION N/A 05/24/2020   Procedure: CARDIOVERSION;  Surgeon: Jake Bathe, MD;  Location[facility] ENDOSCOPY;  Service: Cardiovascular;  Laterality: N/A;   COLONOSCOPY  06/27/2021   06/2021->2 adenomas->recall 3 yrs per GI MD   DC cardioversion  06/11/2017   was back in A-fib within 5 min of  waking up.   TRANSTHORACIC ECHOCARDIOGRAM  04/2012   2013-NORMAL (ED 55-65%).  01/2021 EF 60-65%, valves normal. 12/2020 EF 65-70%, normal.    Family History  Problem Relation Age of Onset   Heart disease Father        pacemaker, atrial fibrillation   Hypertension Father    Colon cancer Neg Hx    Colon polyps Neg Hx    Esophageal cancer Neg Hx    Stomach cancer Neg Hx    Rectal  cancer Neg Hx     Social History   Socioeconomic History   Marital status: Married    Spouse name: Not on file   Number of children: 2   Years of education: Not on file   Highest education level: Not on file  Occupational History   Occupation: Radio broadcast assistant  Tobacco Use   Smoking status: Never   Smokeless tobacco: Never  Vaping Use   Vaping status: Never Used  Substance and Sexual Activity   Alcohol use: Not Currently    Alcohol/week: 4.0 - 5.0 standard drinks of alcohol    Types: 4 - 5 Cans of beer per week    Comment: daily   Drug use: No   Sexual activity: Not on file  Other Topics Concern   Not on file  Social History Narrative   Lives at home with wife and two children.     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  Alc: 4 beers per day.  Denies hx of alc problems/abuse.  However, he says he tried quitting in the past and after a few days he started to have "panic attacks".         Social Determinants of Health   Financial Resource Strain: Not on file  Food Insecurity: Not on file  Transportation Needs: Not on file  Physical Activity: Not on file  Stress: Not on file  Social Connections: Not on file  Intimate Partner Violence: Not on file    Outpatient Medications Prior to Visit  Medication Sig Dispense Refill   cetirizine (ZYRTEC) 10 MG tablet Take 10 mg by mouth daily.     clotrimazole-betamethasone (LOTRISONE) cream APPLY TO AFFECTED AREA TWICE A DAY 30 g 0   diltiazem (CARDIZEM CD) 180 MG 24 hr capsule TAKE 1 CAPSULE BY MOUTH EVERY DAY 90 capsule 3   escitalopram (LEXAPRO) 10 MG tablet Take 1 tablet (10 mg total) by mouth daily. 90 tablet 0   lisinopril (ZESTRIL) 40 MG tablet TAKE 1 TABLET BY MOUTH EVERYDAY AT BEDTIME 90 tablet 0   metFORMIN (GLUCOPHAGE) 500 MG tablet TAKE 1 TAB BY MOUTH EVERY MORNING AND 2 TABS EVERY EVENING . 270 tablet 0   metoprolol succinate (TOPROL-XL) 25 MG 24 hr tablet Take 1  tablet (25 mg total) by mouth daily. 15 tablet 0   amoxicillin-clavulanate (AUGMENTIN) 875-125 MG tablet Take 1 tablet by mouth 2 (two) times daily.     DM-APAP-CPM (CORICIDIN HBP PO) Take by mouth. Takng 3-4 tablets by mouth daily as needed (Patient not taking: Reported on 07/10/2022)     ipratropium (ATROVENT) 0.06 % nasal spray Place 2 sprays into both nostrils 4 (four) times daily. (Patient not taking: Reported on 07/10/2022) 15 mL 12   rosuvastatin (CRESTOR) 5 MG tablet TAKE 1 TABLET (5 MG TOTAL) BY MOUTH DAILY. (Patient not taking: Reported on 07/10/2022) 90 tablet 0   No facility-administered medications prior to visit.    Allergies  Allergen  Reactions   Acetaminophen Diarrhea   Pravastatin     Decreased energy, "wiped out"    Review of Systems  Constitutional:  Negative for appetite change, chills, fatigue and fever.  HENT:  Negative for congestion, dental problem, ear pain and sore throat.   Eyes:  Negative for discharge, redness and visual disturbance.  Respiratory:  Negative for cough, chest tightness, shortness of breath and wheezing.   Cardiovascular:  Negative for chest pain, palpitations and leg swelling.  Gastrointestinal:  Negative for abdominal pain, blood in stool, diarrhea, nausea and vomiting.  Genitourinary:  Negative for difficulty urinating, dysuria, flank pain, frequency, hematuria and urgency.  Musculoskeletal:  Negative for arthralgias, back pain, joint swelling, myalgias and neck stiffness.  Skin:  Negative for pallor and rash.  Neurological:  Negative for dizziness, speech difficulty, weakness and headaches.  Hematological:  Negative for adenopathy. Does not bruise/bleed easily.  Psychiatric/Behavioral:  Negative for confusion and sleep disturbance. The patient is not nervous/anxious.     PE;    02/18/2023   10:49 AM 07/10/2022    8:15 AM 06/04/2022    8:35 AM  Vitals with BMI  Height  6\' 2"  6\' 2"   Weight 202 lbs 6 oz 208 lbs 3 oz 210 lbs  BMI  26.72 26.95   Systolic 132 125 409  Diastolic 82 86 80  Pulse 67 74 88    Gen: Alert, well appearing.  Patient is oriented to person, place, time, and situation. AFFECT: pleasant, lucid thought and speech. ENT: Ears: EACs clear, normal epithelium.  TMs with good light reflex and landmarks bilaterally.  Eyes: no injection, icteris, swelling, or exudate.  EOMI, PERRLA. Nose: no drainage or turbinate edema/swelling.  No injection or focal lesion.  Mouth: lips without lesion/swelling.  Oral mucosa pink and moist.  Dentition intact and without obvious caries or gingival swelling.  Oropharynx without erythema, exudate, or swelling.  Neck: supple/nontender.  No LAD, mass, or TM.  Carotid pulses 2+ bilaterally, without bruits. CV: RRR, no m/r/g.   LUNGS: CTA bilat, nonlabored resps, good aeration in all lung fields. ABD: soft, NT, ND, BS normal.  No hepatospenomegaly or mass.  No bruits. EXT: no clubbing, cyanosis, or edema.  Musculoskeletal: no joint swelling, erythema, warmth, or tenderness.  ROM of all joints intact. Skin - no sores or suspicious lesions or rashes or color changes  Pertinent labs:  Lab Results  Component Value Date   TSH 2.32 01/12/2022   Lab Results  Component Value Date   WBC 9.4 06/01/2022   HGB 15.8 06/01/2022   HCT 45.8 06/01/2022   MCV 91.1 06/01/2022   PLT 210 06/01/2022   Lab Results  Component Value Date   CREATININE 0.73 07/10/2022   BUN 14 07/10/2022   NA 138 07/10/2022   K 4.4 07/10/2022   CL 99 07/10/2022   CO2 31 07/10/2022   Lab Results  Component Value Date   ALT 90 (H) 07/10/2022   AST 72 (H) 07/10/2022   ALKPHOS 55 07/10/2022   BILITOT 0.5 07/10/2022   Lab Results  Component Value Date   CHOL 218 (H) 07/10/2022   Lab Results  Component Value Date   HDL 38.50 (L) 07/10/2022   Lab Results  Component Value Date   LDLCALC 144 (H) 07/10/2022   Lab Results  Component Value Date   TRIG 181.0 (H) 07/10/2022   Lab Results  Component Value Date    CHOLHDL 6 07/10/2022   Lab Results  Component Value Date  PSA 0.30 01/12/2022   PSA 0.26 12/06/2020   PSA 0.33 10/30/2014   ASSESSMENT AND PLAN:   1 diabetes without complication, poor control. Point-of-care hemoglobin A1c today is 9.3% Cont metformin 1000mg  bid.  He is resistant to adding any further diabetes medication at this time. He will work harder on diet and will restart exercising. Feet exam normal today. Referred to optometry today for diabetic retinopathy screening exam.   2.  Hypertension, well-controlled on lisinopril 40 mg daily and Toprol-XL 25 mg daily. Electrolytes and creatinine monitoring today.  #3 hyperlipidemia, LDL goal less than 70. Doing well on rosuvastatin 5 mg a day. Recheck lipids and hepatic panel today.    #4 elevated transaminases. Viral hepatitis serology negative. History of moderate beer intake. Patient states ultrasound in the remote past did show fatty liver. Limited right upper quadrant ultrasound ordered last visit but he was unable to get it scheduled. Reordered this today.  #5 preventative health Vaccines: Flu->given today.  Otherwise UTD. Prostate ca screening: PSA today Colon ca screening: 2 adenomas on colonoscopy 2023.  Repeat 2026.  An After Visit Summary was printed and given to the patient.  FOLLOW UP:  Return in about 3 months (around 05/21/2023) for routine chronic illness f/u.  Signed:  Santiago Bumpers, MD           02/18/2023

## 2023-03-03 ENCOUNTER — Encounter: Payer: Self-pay | Admitting: Family Medicine

## 2023-03-03 ENCOUNTER — Ambulatory Visit: Payer: BC Managed Care – PPO | Admitting: Family Medicine

## 2023-03-03 VITALS — BP 122/91 | HR 91 | Wt 197.6 lb

## 2023-03-03 DIAGNOSIS — E119 Type 2 diabetes mellitus without complications: Secondary | ICD-10-CM

## 2023-03-03 DIAGNOSIS — E1165 Type 2 diabetes mellitus with hyperglycemia: Secondary | ICD-10-CM

## 2023-03-03 DIAGNOSIS — Z794 Long term (current) use of insulin: Secondary | ICD-10-CM

## 2023-03-03 DIAGNOSIS — F5101 Primary insomnia: Secondary | ICD-10-CM | POA: Diagnosis not present

## 2023-03-03 LAB — GLUCOSE, POCT (MANUAL RESULT ENTRY): POC Glucose: 304 mg/dL — AB (ref 70–99)

## 2023-03-03 MED ORDER — TRAZODONE HCL 50 MG PO TABS: ORAL_TABLET | ORAL | 0 refills | Status: DC

## 2023-03-03 MED ORDER — INSULIN NPH ISOPHANE & REGULAR (70-30) 100 UNIT/ML ~~LOC~~ SUSP: SUBCUTANEOUS | 11 refills | Status: DC

## 2023-03-03 NOTE — Progress Notes (Signed)
OFFICE VISIT  03/03/2023  CC:  Chief Complaint  Patient presents with   Elevated Blood Sugars    Pt states today Blood sugar was 340 and yesterday was 360. Denies any change in diet.     Patient is a 57 y.o. male who presents for elevated sugars. I last saw him about 2 weeks ago. A/P as of that visit: "1 diabetes without complication, poor control. Point-of-care hemoglobin A1c today is 9.3% Cont metformin 1000mg  bid.  He is resistant to adding any further diabetes medication at this time. He will work harder on diet and will restart exercising. Feet exam normal today. Referred to optometry today for diabetic retinopathy screening exam.   2.  Hypertension, well-controlled on lisinopril 40 mg daily and Toprol-XL 25 mg daily. Electrolytes and creatinine monitoring today.   #3 hyperlipidemia, LDL goal less than 70. Doing well on rosuvastatin 5 mg a day. Recheck lipids and hepatic panel today.     #4 elevated transaminases. Viral hepatitis serology negative. History of moderate beer intake. Patient states ultrasound in the remote past did show fatty liver. Limited right upper quadrant ultrasound ordered last visit but he was unable to get it scheduled. Reordered this today.   #5 preventative health Vaccines: Flu->given today.  Otherwise UTD. Prostate ca screening: PSA today Colon ca screening: 2 adenomas on colonoscopy 2023.  Repeat 2026."  INTERIM HX: About a week ago he was getting a dental procedure and they checked his sugar it was around 250 when fasting. He started checking it every morning since then and it has been in the 300-400 range. He had not been checking his glucose regularly at all prior to last week. Poor diabetes control historically, see documentation from encounter visit from 2 weeks ago for details.  No dizziness, nausea, headaches, polyuria, or polydipsia. He has chronic insomnia for which he drinks 4 beers every night.  He recently held off from  drinking for a couple of nights and had severe insomnia.  He denies any withdrawal symptoms from alcohol. He states he has never been on any prescription medicine for insomnia.  ROS as above, plus--> no fevers, No myalgias or arthralgias.  No focal weakness, paresthesias, or tremors.  No dysuria or unusual/new urinary urgency or frequency.  No recent changes in lower legs. No n/v/d or abd pain.  No palpitations.    Past Medical History:  Diagnosis Date   Anxiety attack    Diabetes mellitus (HCC) 07/2019   prediab 2015.  DM 2 dx 07/2019   Elevated transaminase level    suspect combo of hyperlip/fatty liver + alcohol.  Viral Hep panel neg 2016.   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 Antoine Poche.  Spont conv in ED.  Outpt eval showed nl ECHO--ASA qd and BB/CCB started.  Multiple cardioversions and recurrences, finally got ablation procedure.03/2019--no OAC b/c CHADVASC score low. Recurrence->ablation again 02/2021   Peyronie's disease     Past Surgical History:  Procedure Laterality Date   ATRIAL FIBRILLATION ABLATION N/A 03/31/2019   Procedure: ATRIAL FIBRILLATION ABLATION;  Surgeon: Hillis Range, MD;  [facility];  Service: Cardiovascular;  Laterality:  N/A;   ATRIAL FIBRILLATION ABLATION N/A 02/06/2021   Procedure: ATRIAL FIBRILLATION ABLATION;  Surgeon: Hillis Range, MD;  Location: [facility];  Service: Cardiovascular;  Laterality: N/A;   CARDIOVERSION N/A 06/11/2017   Procedure: CARDIOVERSION;  Surgeon: Chilton Si, MD;  Location: [facility] ENDOSCOPY;  Service: Cardiovascular;  Laterality: N/A;   CARDIOVERSION N/A 01/23/2019   Procedure: CARDIOVERSION;   Surgeon: Jodelle Red, MD;  Location: [facility] ENDOSCOPY;  Service: Cardiovascular;  Laterality: N/A;   CARDIOVERSION N/A 05/24/2020   Procedure: CARDIOVERSION;  Surgeon: Jake Bathe, MD;  Location: [facility] ENDOSCOPY;  Service: Cardiovascular;  Laterality: N/A;   COLONOSCOPY  06/27/2021   06/2021->2 adenomas->recall 3 yrs per GI MD   DC cardioversion  06/11/2017   was back in A-fib within 5 min of waking up.   TRANSTHORACIC ECHOCARDIOGRAM  04/2012   2013-NORMAL (ED 55-65%).  01/2021 EF 60-65%, valves normal. 12/2020 EF 65-70%, normal.    Outpatient Medications Prior to Visit  Medication Sig Dispense Refill   cetirizine (ZYRTEC) 10 MG tablet Take 10 mg by mouth daily.     clotrimazole-betamethasone (LOTRISONE) cream APPLY TO AFFECTED AREA TWICE A DAY 30 g 0   diltiazem (CARDIZEM CD) 180 MG 24 hr capsule TAKE 1 CAPSULE BY MOUTH EVERY DAY 90 capsule 3   escitalopram (LEXAPRO) 10 MG tablet Take 1 tablet (10 mg total) by mouth daily. 90 tablet 0   lisinopril (ZESTRIL) 40 MG tablet TAKE 1 TABLET BY MOUTH EVERYDAY AT BEDTIME 90 tablet 0   metFORMIN (GLUCOPHAGE) 500 MG tablet TAKE 1 TAB BY MOUTH EVERY MORNING AND 2 TABS EVERY EVENING . 270 tablet 0   metoprolol succinate (TOPROL-XL) 25 MG 24 hr tablet Take 1 tablet (25 mg total) by mouth daily. 15 tablet 0   rosuvastatin (CRESTOR) 5 MG tablet Take 1 tablet (5 mg total) by mouth daily. 90 tablet 1   No facility-administered medications prior to visit.    Allergies  Allergen Reactions   Acetaminophen Diarrhea   Pravastatin     Decreased energy, "wiped out"    Review of Systems As per HPI  PE:    03/03/2023   11:05 AM 02/18/2023   10:49 AM 07/10/2022    8:15 AM  Vitals with BMI  Height   6\' 2"   Weight 197 lbs 10 oz 202 lbs 6 oz 208 lbs 3 oz  BMI   26.72  Systolic 122 132 478  Diastolic 91 82 86  Pulse 91 67 74     Physical Exam  Gen: Alert, well appearing.  Patient is oriented to person, place, time, and situation. No further  exam today  LABS:  Last CBC Lab Results  Component Value Date   WBC 9.4 06/01/2022   HGB 15.8 06/01/2022   HCT 45.8 06/01/2022   MCV 91.1 06/01/2022   MCH 31.4 06/01/2022   RDW 12.5 06/01/2022   PLT 210 06/01/2022   Last metabolic panel Lab Results  Component Value Date   GLUCOSE 292 (H) 02/18/2023   NA 133 (L) 02/18/2023   K 4.4 02/18/2023   CL 96 02/18/2023   CO2 28 02/18/2023   BUN 12 02/18/2023   CREATININE 0.71 02/18/2023   GFR 101.16 02/18/2023   CALCIUM 9.5 02/18/2023   PROT 7.1 02/18/2023   ALBUMIN 4.1 02/18/2023   BILITOT 1.0 02/18/2023   ALKPHOS 71 02/18/2023   AST 131 (H) 02/18/2023   ALT 109 (H) 02/18/2023   ANIONGAP 11 06/01/2022   Last lipids Lab Results  Component Value Date   CHOL 174 02/18/2023   HDL 27.90 (L) 02/18/2023   LDLCALC 102 (H) 02/18/2023   LDLDIRECT 131.0 01/12/2022   TRIG 221.0 (H) 02/18/2023   CHOLHDL 6 02/18/2023   Last hemoglobin A1c Lab Results  Component Value Date   HGBA1C 9.3 (A) 02/18/2023   HGBA1C 9.3 02/18/2023   HGBA1C 9.3 (A) 02/18/2023   HGBA1C 9.3 (A) 02/18/2023   Last thyroid functions Lab Results  Component Value Date   TSH 2.32 01/12/2022   IMPRESSION AND PLAN:  #1 diabetes without complication, poor control. Glucose currently is 304, hemoglobin A1c 2 weeks ago was 9.3%. He is now open to starting insulin. Prescribed 70/30 insulin, 16 units every morning and 8 units every supper. Emphasized the need to eat 3 meals per day but start to make better choices regarding carbohydrates. Will see how he responds and consider transitioning to an aggressive oral regimen in a few months. For now continue metformin 500 mg every morning and 1000 mg q. evening. Continuous glucose monitor ordered.  #2 insomnia. He will stop his alcohol at night. Start trazodone 50 mg, 1-2 nightly as needed. Will see how he does and possibly adjust dose up at follow-up in 1 week.   An After Visit Summary was printed and given to  the patient.  FOLLOW UP: Return in about 1 week (around 03/10/2023) for f/u DM/hyperglycemia/insulin.  Signed:  Santiago Bumpers, MD           03/03/2023

## 2023-03-03 NOTE — Patient Instructions (Signed)
Check your glucose fasting every morning and 2 hours after supper every day. Write these down and bring them in to review with me in 1 week.

## 2023-03-10 ENCOUNTER — Ambulatory Visit: Payer: BC Managed Care – PPO | Admitting: Family Medicine

## 2023-04-16 ENCOUNTER — Other Ambulatory Visit: Payer: Self-pay | Admitting: Family Medicine

## 2023-05-09 ENCOUNTER — Other Ambulatory Visit: Payer: Self-pay | Admitting: Family Medicine

## 2023-05-26 ENCOUNTER — Ambulatory Visit: Payer: BC Managed Care – PPO | Admitting: Family Medicine

## 2023-06-03 ENCOUNTER — Other Ambulatory Visit: Payer: Self-pay | Admitting: Family Medicine

## 2023-06-24 ENCOUNTER — Ambulatory Visit: Payer: BC Managed Care – PPO | Admitting: Family Medicine

## 2023-06-24 ENCOUNTER — Other Ambulatory Visit: Payer: Self-pay | Admitting: Family Medicine

## 2023-07-04 ENCOUNTER — Other Ambulatory Visit: Payer: Self-pay | Admitting: Family Medicine

## 2023-07-05 ENCOUNTER — Other Ambulatory Visit: Payer: Self-pay | Admitting: Family Medicine

## 2023-07-09 ENCOUNTER — Encounter: Payer: Self-pay | Admitting: Family Medicine

## 2023-07-09 ENCOUNTER — Telehealth: Admitting: Family Medicine

## 2023-07-09 ENCOUNTER — Other Ambulatory Visit: Payer: Self-pay | Admitting: Family Medicine

## 2023-07-09 DIAGNOSIS — J329 Chronic sinusitis, unspecified: Secondary | ICD-10-CM

## 2023-07-09 DIAGNOSIS — B9689 Other specified bacterial agents as the cause of diseases classified elsewhere: Secondary | ICD-10-CM

## 2023-07-09 MED ORDER — DOXYCYCLINE HYCLATE 100 MG PO TABS
100.0000 mg | ORAL_TABLET | Freq: Two times a day (BID) | ORAL | 0 refills | Status: DC
Start: 2023-07-09 — End: 2023-08-04

## 2023-07-09 NOTE — Patient Instructions (Addendum)

## 2023-07-09 NOTE — Progress Notes (Signed)
 VIRTUAL VISIT VIA VIDEO  I connected with Riley Moore on 07/09/23 at  2:00 PM EST by a video enabled telemedicine application and verified that I am speaking with the correct person using two identifiers. Location patient: Home Location provider: [facility], Office Persons participating in the virtual visit: Patient, Dr. Claiborne Billings and Ivonne Andrew, CMA  I discussed the limitations of evaluation and management by telemedicine and the availability of in person appointments. The patient expressed understanding and agreed to proceed.     Riley Moore , [birth year], 58 y.o., male MRN: [id number] Patient Care Team    Relationship Specialty Notifications Start End  McGowen, Maryjean Morn, MD PCP - General Family Medicine  03/22/14   Rollene Rotunda, MD PCP - Cardiology Cardiology  11/24/18   Newman Nip, NP (Inactive) Nurse Practitioner Cardiology  05/20/17   Rollene Rotunda, MD Consulting Physician Cardiology  05/20/17   Vivi Barrack, DPM Consulting Physician Podiatry  08/07/18   Hillis Range, MD (Inactive) Consulting Physician Cardiology  03/06/20   Jannifer Hick, MD Consulting Physician Urology  05/24/20   Jenel Lucks, MD Consulting Physician Gastroenterology  01/09/22     Chief Complaint  Patient presents with   Cough    3 weeks; cough, congestion, drainage. Pt has not taken anything to ease symptoms.      Subjective: Riley Moore is a 58 y.o. Pt presents for an OV with complaints of cough, congestion and nasal drainage of 3 weeks duration.  Associated symptoms include fever/chills on initial onset of symptoms.   Pt has tried zyrtec to ease their symptoms.      05/19/2022   10:58 AM 01/09/2022   11:08 AM 07/25/2019    2:29 PM 07/17/2019    8:11 AM 06/10/2018    8:47 AM  Depression screen PHQ 2/9  Decreased Interest 0 0 0 0 0  Down, Depressed, Hopeless 0 0 0 0 0  PHQ - 2 Score 0 0 0 0 0    Allergies  Allergen Reactions   Acetaminophen Diarrhea   Pravastatin      Decreased energy, "wiped out"   Social History   Social History Narrative   Lives at home with wife and two children.     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  Alc: 4 beers per day.  Denies hx of alc problems/abuse.  However, he says he tried quitting in the past and after a few days he started to have "panic attacks".         Past Medical History:  Diagnosis Date   Anxiety attack    Diabetes mellitus (HCC) 07/2019   prediab 2015.  DM 2 dx 07/2019   Elevated transaminase level    suspect combo of hyperlip/fatty liver + alcohol.  Viral Hep panel neg 2016.   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 Antoine Poche.  Spont conv in ED.  Outpt eval showed nl ECHO--ASA qd and BB/CCB started.  Multiple cardioversions and recurrences, finally got ablation procedure.03/2019--no OAC b/c CHADVASC score low. Recurrence->ablation again 02/2021  Peyronie's disease    Past Surgical History:  Procedure Laterality Date   ATRIAL FIBRILLATION ABLATION N/A 03/31/2019   Procedure: ATRIAL FIBRILLATION ABLATION;  Surgeon: Hillis Range, MD;  [facility];  Service: Cardiovascular;  Laterality: N/A;   ATRIAL FIBRILLATION ABLATION N/A 02/06/2021   Procedure: ATRIAL FIBRILLATION ABLATION;  Surgeon: Hillis Range, MD;  Location: [facility];  Service: Cardiovascular;  Laterality: N/A;   CARDIOVERSION N/A 06/11/2017   Procedure: CARDIOVERSION;  Surgeon: Chilton Si, MD;  Location: [facility] ENDOSCOPY;  Service: Cardiovascular;  Laterality: N/A;   CARDIOVERSION N/A 01/23/2019   Procedure: CARDIOVERSION;  Surgeon: Jodelle Red,  MD;  Location: [facility] ENDOSCOPY;  Service: Cardiovascular;  Laterality: N/A;   CARDIOVERSION N/A 05/24/2020   Procedure: CARDIOVERSION;  Surgeon: Jake Bathe, MD;  Location: [facility] ENDOSCOPY;  Service: Cardiovascular;  Laterality: N/A;   COLONOSCOPY  06/27/2021   06/2021->2 adenomas->recall 3 yrs per GI MD   DC cardioversion  06/11/2017   was back in A-fib within 5 min of waking up.   TRANSTHORACIC ECHOCARDIOGRAM  04/2012   2013-NORMAL (ED 55-65%).  01/2021 EF 60-65%, valves normal. 12/2020 EF 65-70%, normal.   Family History  Problem Relation Age of Onset   Heart disease Father        pacemaker, atrial fibrillation   Hypertension Father    Colon cancer Neg Hx    Colon polyps Neg Hx    Esophageal cancer Neg Hx    Stomach cancer Neg Hx    Rectal cancer Neg Hx    Allergies as of 07/09/2023       Reactions   Acetaminophen Diarrhea   Pravastatin    Decreased energy, "wiped out"        Medication List        Accurate as of July 09, 2023  2:03 PM. If you have any questions, ask your nurse or doctor.          cetirizine 10 MG tablet Commonly known as: ZYRTEC Take 10 mg by mouth daily.   clotrimazole-betamethasone cream Commonly known as: LOTRISONE APPLY TO AFFECTED AREA TWICE A DAY. OFFICE VISIT NEEDED FOR FURTHER REFILLS   diltiazem 180 MG 24 hr capsule Commonly known as: CARDIZEM CD TAKE 1 CAPSULE BY MOUTH EVERY DAY   doxycycline 100 MG tablet Commonly known as: VIBRA-TABS Take 1 tablet (100 mg total) by mouth 2 (two) times daily. Started by: Felix Pacini   escitalopram 10 MG tablet Commonly known as: LEXAPRO Take 1 tablet (10 mg total) by mouth daily. OFFICE VISIT NEEDED FOR FURTHER REFILLS What changed: additional instructions Changed by: Maryjean Morn McGowen   insulin NPH-regular Human (70-30) 100 UNIT/ML injection 16 units subcu every morning and 8 units subcu with supper   lisinopril 40 MG tablet Commonly known as: ZESTRIL TAKE 1 TABLET BY MOUTH EVERYDAY AT  BEDTIME. OFFICE VISIT NEEDED FOR FURTHER REFILLS   metFORMIN 500 MG tablet Commonly known as: GLUCOPHAGE TAKE 1 TABLET BY MOUTH IN THE MORNING AND 2 TABLETS IN THE EVENING. OFFICE VISIT NEEDED FOR FURTHER REFILLS   metoprolol succinate 25 MG 24 hr tablet Commonly known as: TOPROL-XL Take 1 tablet (25 mg total) by mouth daily.   rosuvastatin 5 MG tablet Commonly known as: CRESTOR Take 1 tablet (5 mg total) by mouth daily.   traZODone 50 MG tablet Commonly known as: DESYREL TAKE 1-2 TABLETS BY MOUTH AT BEDTIME FOR INSOMNIA. OFFICE VISIT NEEDED FOR FURTHER REFILLS        All past  medical history, surgical history, allergies, family history, immunizations andmedications were updated in the EMR today and reviewed under the history and medication portions of their EMR.     Review of Systems  Constitutional:  Positive for chills and fever.  HENT:  Positive for congestion, ear pain and sore throat. Negative for sinus pain.   Eyes: Negative.   Respiratory:  Positive for cough and sputum production. Negative for shortness of breath and wheezing.   Cardiovascular: Negative.   Gastrointestinal:  Negative for diarrhea, nausea and vomiting.  Skin:  Negative for rash.   Negative, with the exception of above mentioned in HPI   Objective:  There were no vitals taken for this visit. There is no height or weight on file to calculate BMI.  Physical Exam Vitals and nursing note reviewed.  Constitutional:      General: He is not in acute distress.    Appearance: Normal appearance. He is not toxic-appearing.  HENT:     Head: Normocephalic and atraumatic.  Eyes:     General: No scleral icterus.       Right eye: No discharge.        Left eye: No discharge.     Conjunctiva/sclera: Conjunctivae normal.  Pulmonary:     Effort: Pulmonary effort is normal.  Musculoskeletal:     Cervical back: Normal range of motion.  Skin:    Findings: No rash.  Neurological:     Mental Status: He is  alert and oriented to person, place, and time. Mental status is at baseline.  Psychiatric:        Mood and Affect: Mood normal.        Behavior: Behavior normal.        Thought Content: Thought content normal.        Judgment: Judgment normal.    No results found. No results found. No results found for this or any previous visit (from the past 24 hours).  Assessment/Plan: Riley Moore is a 58 y.o. male present for OV for  Bacterial sinusitis Rest, hydrate.  mucinex, nettie pot or nasal saline.  Continue zyrtec doxycyline prescribed, take until completed.  If cough present it can last up to 6-8 weeks.  F/U 2 weeks if not improved.   Reviewed expectations re: course of current medical issues. Discussed self-management of symptoms. Outlined signs and symptoms indicating need for more acute intervention. Patient verbalized understanding and all questions were answered. Patient received an After-Visit Summary.    No orders of the defined types were placed in this encounter.  Meds ordered this encounter  Medications   doxycycline (VIBRA-TABS) 100 MG tablet    Sig: Take 1 tablet (100 mg total) by mouth 2 (two) times daily.    Dispense:  20 tablet    Refill:  0   Referral Orders  No referral(s) requested today     Note is dictated utilizing voice recognition software. Although note has been proof read prior to signing, occasional typographical errors still can be missed. If any questions arise, please do not hesitate to call for verification.   electronically signed by:  Felix Pacini, DO  [facility] [place]

## 2023-07-16 ENCOUNTER — Other Ambulatory Visit: Payer: Self-pay | Admitting: Family Medicine

## 2023-07-26 ENCOUNTER — Other Ambulatory Visit: Payer: Self-pay | Admitting: Family Medicine

## 2023-07-26 ENCOUNTER — Other Ambulatory Visit: Payer: Self-pay | Admitting: Cardiology

## 2023-07-26 NOTE — Telephone Encounter (Signed)
 Copied from CRM [id number]. Topic: Clinical - Medication Refill >> Jul 26, 2023  2:03 PM [place] A wrote: Most Recent Primary Care Visit:  Provider: Felix Pacini A  Department: [facility]  Visit Type: OFFICE VISIT  Date: 07/09/2023  Medication: escitalopram (LEXAPRO) 10 MG tablet;  metFORMIN (GLUCOPHAGE) 500 MG tablet    Has the patient contacted their pharmacy? Yes (Agent: If no, request that the patient contact the pharmacy for the refill. If patient does not wish to contact the pharmacy document the reason why and proceed with request.) (Agent: If yes, when and what did the pharmacy advise?) Pt needs to schedule appt to have refills  Is this the correct pharmacy for this prescription? Yes If no, delete pharmacy and type the correct one.  This is the patient's preferred pharmacy:  [id number] [place], [place] - [address] [place] [address] Phone: [phone] Fax: [phone]  [id number] [place] - [address] [facility] [address] [place] [address] Phone: [phone] Fax: [phone]   Has the prescription been filled recently? No  Is the patient out of the medication? Yes  Has the patient been seen for an appointment in the last year OR does the patient have an upcoming appointment? Yes  Can we respond through MyChart? Yes  Agent: Please be advised that Rx refills may take up to 3 business days. We ask that you follow-up with your pharmacy.

## 2023-07-27 ENCOUNTER — Other Ambulatory Visit: Payer: Self-pay | Admitting: Family Medicine

## 2023-07-27 NOTE — Telephone Encounter (Signed)
 Copied from CRM [id number]. Topic: Clinical - Medication Refill >> Jul 27, 2023  3:48 PM Alcus Dad wrote: Most Recent Primary Care Visit:  Provider: Felix Pacini A  Department: [facility]  Visit Type: OFFICE VISIT  Date: 07/09/2023  Medication: metFORMIN (GLUCOPHAGE) 500 MG tablet escitalopram (LEXAPRO) 10 MG tablet  Has the patient contacted their pharmacy? Yes (Agent: If no, request that the patient contact the pharmacy for the refill. If patient does not wish to contact the pharmacy document the reason why and proceed with request.) (Agent: If yes, when and what did the pharmacy advise?)  Is this the correct pharmacy for this prescription? Yes If no, delete pharmacy and type the correct one.  This is the patient's preferred pharmacy:  [id number] [place], [place] - [address] [place] [address] Phone: [phone] Fax: [phone]  [id number] - [place] - [address] [facility] [address] [place] [address] Phone: [phone] Fax: [phone]   Has the prescription been filled recently? No  Is the patient out of the medication? Yes  Has the patient been seen for an appointment in the last year OR does the patient have an upcoming appointment? Yes  Can we respond through MyChart? Yes  Agent: Please be advised that Rx refills may take up to 3 business days. We ask that you follow-up with your pharmacy.

## 2023-07-27 NOTE — Addendum Note (Signed)
 Addended by: Willa Frater R on: 07/27/2023 11:42 AM   Modules accepted: Orders

## 2023-08-05 ENCOUNTER — Encounter: Payer: Self-pay | Admitting: Family Medicine

## 2023-08-05 ENCOUNTER — Ambulatory Visit: Admitting: Family Medicine

## 2023-08-05 VITALS — BP 128/87 | HR 72 | Ht 74.0 in | Wt 198.4 lb

## 2023-08-05 DIAGNOSIS — I10 Essential (primary) hypertension: Secondary | ICD-10-CM

## 2023-08-05 DIAGNOSIS — Z7984 Long term (current) use of oral hypoglycemic drugs: Secondary | ICD-10-CM

## 2023-08-05 DIAGNOSIS — F411 Generalized anxiety disorder: Secondary | ICD-10-CM

## 2023-08-05 DIAGNOSIS — R7401 Elevation of levels of liver transaminase levels: Secondary | ICD-10-CM

## 2023-08-05 DIAGNOSIS — E119 Type 2 diabetes mellitus without complications: Secondary | ICD-10-CM | POA: Diagnosis not present

## 2023-08-05 DIAGNOSIS — F1020 Alcohol dependence, uncomplicated: Secondary | ICD-10-CM

## 2023-08-05 DIAGNOSIS — Z794 Long term (current) use of insulin: Secondary | ICD-10-CM

## 2023-08-05 LAB — POCT GLYCOSYLATED HEMOGLOBIN (HGB A1C)
HbA1c POC (<> result, manual entry): 9.8 % (ref 4.0–5.6)
HbA1c, POC (controlled diabetic range): 9.8 % — AB (ref 0.0–7.0)
HbA1c, POC (prediabetic range): 9.8 % — AB (ref 5.7–6.4)
Hemoglobin A1C: 9.8 % — AB (ref 4.0–5.6)

## 2023-08-05 MED ORDER — LORAZEPAM 0.5 MG PO TABS: ORAL_TABLET | ORAL | 0 refills | Status: DC

## 2023-08-05 MED ORDER — LISINOPRIL 40 MG PO TABS: ORAL_TABLET | ORAL | 1 refills | Status: DC

## 2023-08-05 MED ORDER — ESCITALOPRAM OXALATE 10 MG PO TABS: 10.0000 mg | ORAL_TABLET | Freq: Every day | ORAL | 1 refills | Status: DC

## 2023-08-05 MED ORDER — METFORMIN HCL 1000 MG PO TABS: ORAL_TABLET | ORAL | 1 refills | Status: DC

## 2023-08-05 NOTE — Progress Notes (Signed)
 OFFICE VISIT  08/05/2023  CC: No chief complaint on file.   Patient is a 58 y.o. male who presents for 3-month follow-up diabetes and HTN. A/P as of last visit: "#1 diabetes without complication, poor control. Glucose currently is 304, hemoglobin A1c 2 weeks ago was 9.3%. He is now open to starting insulin. Prescribed 70/30 insulin, 16 units every morning and 8 units every supper. Emphasized the need to eat 3 meals per day but start to make better choices regarding carbohydrates. Will see how he responds and consider transitioning to an aggressive oral regimen in a few months. For now continue metformin 500 mg every morning and 1000 mg q. evening. Continuous glucose monitor ordered.   #2 insomnia. He will stop his alcohol at night. Start trazodone 50 mg, 1-2 nightly as needed. Will see how he does and possibly adjust dose up at follow-up in 1 week."  INTERIM HX: He states he feels very well. He has some residual cough from a respiratory illness last month.  Denies shortness of breath, wheezing, chest tightness, or fever.  He did not start insulin. He has been taking 6 metformin tabs a day.  States he wants to quit drinking alcohol.  Says he has not had problems stopping in the past but did note onset of severe anxiety and some palpitations a couple of days after stopping usually. No tremors, no history of seizures or severe DTs. He currently drinks 6-8 beers per night.  ROS as above, plus-->  no CP, n no dizziness, no HAs, no rashes, no melena/hematochezia.  No polyuria or polydipsia.  No myalgias or arthralgias.  No focal weakness, paresthesias, or tremors.  No acute vision or hearing abnormalities.  No dysuria or unusual/new urinary urgency or frequency.  No recent changes in lower legs. No n/v/d or abd pain.  No palpitations.    Past Medical History:  Diagnosis Date   Anxiety attack    Diabetes mellitus (HCC) 07/2019   prediab 2015.  DM 2 dx 07/2019   Elevated transaminase  level    suspect combo of hyperlip/fatty liver + alcohol.  Viral Hep panel neg 2016.   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 Antoine Poche.  Spont conv in ED.  Outpt eval showed nl ECHO--ASA qd and BB/CCB started.  Multiple cardioversions and recurrences, finally got ablation procedure.03/2019--no OAC b/c CHADVASC score low. Recurrence->ablation again 02/2021   Peyronie's disease     Past Surgical History:  Procedure Laterality Date   ATRIAL FIBRILLATION ABLATION N/A 03/31/2019   Procedure: ATRIAL FIBRILLATION ABLATION;  Surgeon: Hillis Range, MD;  Location: [facility]IVE [facility];  Service: Cardiovascular;  Laterality: N/A;   ATRIAL FIBRILLATION ABLATION N/A 02/06/2021   Procedure: ATRIAL FIBRILLATION ABLATION;  Surgeon: Hillis Range, MD;  Location: [facility];  Service: Cardiovascular;  Laterality: N/A;   CARDIOVERSION N/A 06/11/2017   Procedure: CARDIOVERSION;  Surgeon: Chilton Si, MD;  Location: [facility] ENDOSCOPY;  Service: Cardiovascular;  Laterality: N/A;   CARDIOVERSION N/A 01/23/2019   Procedure: CARDIOVERSION;  Surgeon: Jodelle Red, MD;  Location: [facility] ENDOSCOPY;  Service: Cardiovascular;  Laterality: N/A;   CARDIOVERSION N/A 05/24/2020  Procedure: CARDIOVERSION;  Surgeon: Jake Bathe, MD;  Location: [facility] ENDOSCOPY;  Service: Cardiovascular;  Laterality: N/A;   COLONOSCOPY  06/27/2021   06/2021->2 adenomas->recall 3 yrs per GI MD   DC cardioversion  06/11/2017   was back in A-fib within 5 min of waking up.   TRANSTHORACIC ECHOCARDIOGRAM  04/2012   2013-NORMAL (ED 55-65%).  01/2021 EF 60-65%, valves normal. 12/2020 EF 65-70%, normal.    Outpatient Medications  Prior to Visit  Medication Sig Dispense Refill   cetirizine (ZYRTEC) 10 MG tablet Take 10 mg by mouth daily.     clotrimazole-betamethasone (LOTRISONE) cream APPLY TO AFFECTED AREA TWICE A DAY. OFFICE VISIT NEEDED FOR FURTHER REFILLS 15 g 0   diltiazem (CARDIZEM CD) 180 MG 24 hr capsule TAKE 1 CAPSULE BY MOUTH EVERY DAY 90 capsule 3   insulin NPH-regular Human (70-30) 100 UNIT/ML injection 16 units subcu every morning and 8 units subcu with supper 10 mL 11   metoprolol succinate (TOPROL-XL) 25 MG 24 hr tablet Take 1 tablet (25 mg total) by mouth daily. 15 tablet 0   rosuvastatin (CRESTOR) 5 MG tablet Take 1 tablet (5 mg total) by mouth daily. 90 tablet 1   traZODone (DESYREL) 50 MG tablet TAKE 1-2 TABLETS BY MOUTH AT BEDTIME FOR INSOMNIA. OFFICE VISIT NEEDED FOR FURTHER REFILLS 30 tablet 0   metFORMIN (GLUCOPHAGE) 500 MG tablet TAKE 1 TABLET BY MOUTH IN THE MORNING AND 2 TABLETS IN THE EVENING. OFFICE VISIT NEEDED FOR FURTHER REFILLS 90 tablet 0   doxycycline (VIBRA-TABS) 100 MG tablet Take 1 tablet (100 mg total) by mouth 2 (two) times daily. 20 tablet 0   escitalopram (LEXAPRO) 10 MG tablet TAKE 1 TABLET (10 MG TOTAL) BY MOUTH DAILY. OFFICE VISIT NEEDED FOR FURTHER REFILLS 15 tablet 0   lisinopril (ZESTRIL) 40 MG tablet TAKE 1 TABLET BY MOUTH EVERYDAY AT BEDTIME. OFFICE VISIT NEEDED FOR FURTHER REFILLS 30 tablet 0   No facility-administered medications prior to visit.    Allergies  Allergen Reactions   Acetaminophen Diarrhea   Pravastatin     Decreased energy, "wiped out"    Review of Systems As per HPI  PE:    08/05/2023    2:34 PM 03/03/2023   11:05 AM 02/18/2023   10:49 AM  Vitals with BMI  Height 6\' 2"     Weight 198 lbs 6 oz 197 lbs 10 oz 202 lbs 6 oz  BMI 25.46    Systolic 128 122 914  Diastolic 87 91 82  Pulse 72 91 67     Physical Exam  Gen: Alert, well appearing.  Patient is oriented to person, place, time, and situation. AFFECT: pleasant, lucid thought and  speech. NWG:NFAO: no injection, icteris, swelling, or exudate.  EOMI, PERRLA. Mouth: lips without lesion/swelling.  Oral mucosa pink and moist. Oropharynx without erythema, exudate, or swelling.  CV: RRR, no m/r/g.   LUNGS: CTA bilat, nonlabored resps, good aeration in all lung fields. EXT: no clubbing or cyanosis.  no edema.    LABS:  Last CBC Lab Results  Component Value Date   WBC 9.4 06/01/2022   HGB 15.8 06/01/2022   HCT 45.8 06/01/2022   MCV 91.1 06/01/2022   MCH 31.4 06/01/2022   RDW 12.5 06/01/2022   PLT 210 06/01/2022   Last metabolic panel Lab Results  Component Value Date   GLUCOSE 292 (H) 02/18/2023   NA 133 (L) 02/18/2023   K 4.4 02/18/2023   CL 96 02/18/2023   CO2 28  02/18/2023   BUN 12 02/18/2023   CREATININE 0.71 02/18/2023   GFR 101.16 02/18/2023   CALCIUM 9.5 02/18/2023   PROT 7.1 02/18/2023   ALBUMIN 4.1 02/18/2023   BILITOT 1.0 02/18/2023   ALKPHOS 71 02/18/2023   AST 131 (H) 02/18/2023   ALT 109 (H) 02/18/2023   ANIONGAP 11 06/01/2022   Last lipids Lab Results  Component Value Date   CHOL 174 02/18/2023   HDL 27.90 (L) 02/18/2023   LDLCALC 102 (H) 02/18/2023   LDLDIRECT 131.0 01/12/2022   TRIG 221.0 (H) 02/18/2023   CHOLHDL 6 02/18/2023   Last hemoglobin A1c Lab Results  Component Value Date   HGBA1C 9.8 (A) 08/05/2023   HGBA1C 9.8 08/05/2023   HGBA1C 9.8 (A) 08/05/2023   HGBA1C 9.8 (A) 08/05/2023   Lab Results  Component Value Date   TSH 2.32 01/12/2022   IMPRESSION AND PLAN:  #1 poorly controlled diabetes. POC Hba1c is 9.8% today. He declines insulin and wants to try 1 month of improving diet and abstaining from alcohol to see if numbers significantly improve first. Will continue him on metformin 1000 mg twice a day. Urine microalbumin/creatinine today.  #2 alcohol dependence. History of mild withdrawal in the past. He will stop alcohol today. Will start lorazepam 0.5 mg tabs, 1 tab 3 times daily x 7 days, then twice  daily x 7 days, then 1/day for 7 days, then half tab daily for 7 days.  #3 GAD, doing well on Lexapro 10 mg a day.  Refilled today.  4.  Hypertension, good control on lisinopril 40 mg a day and Toprol-XL 25 mg a day.  5. Hypercholesterolemia. He is not taking crestor b/c he is apprehensive because of side effects/fatigue from pravastatin in the past. Will address this problem in the near future.  6.  Transaminasemia. Suspect combo of fatty liver and alcohol abuse. Viral hep screening negative in the past. Monitor  hepatic panel today.  An After Visit Summary was printed and given to the patient.  FOLLOW UP: Return in about 2 weeks (around 08/19/2023) for recheck of current acute problem. Next CPE October/November 2025. Signed:  Santiago Bumpers, MD           08/05/2023

## 2023-08-06 LAB — COMPREHENSIVE METABOLIC PANEL WITH GFR
ALT: 134 U/L — ABNORMAL HIGH (ref 0–53)
AST: 125 U/L — ABNORMAL HIGH (ref 0–37)
Albumin: 4.6 g/dL (ref 3.5–5.2)
Alkaline Phosphatase: 73 U/L (ref 39–117)
BUN: 14 mg/dL (ref 6–23)
CO2: 27 meq/L (ref 19–32)
Calcium: 9.3 mg/dL (ref 8.4–10.5)
Chloride: 97 meq/L (ref 96–112)
Creatinine, Ser: 0.7 mg/dL (ref 0.40–1.50)
GFR: 101.26 mL/min (ref >60.00)
Glucose, Bld: 230 mg/dL — ABNORMAL HIGH (ref 70–99)
Potassium: 4.1 meq/L (ref 3.5–5.1)
Sodium: 135 meq/L (ref 135–145)
Total Bilirubin: 0.9 mg/dL (ref 0.2–1.2)
Total Protein: 7.4 g/dL (ref 6.0–8.3)

## 2023-08-06 LAB — MICROALBUMIN / CREATININE URINE RATIO
Creatinine,U: 110.6 mg/dL
Microalb Creat Ratio: 342.8 mg/g — ABNORMAL HIGH (ref 0.0–30.0)
Microalb, Ur: 37.9 mg/dL — ABNORMAL HIGH (ref 0.0–1.9)

## 2023-08-08 ENCOUNTER — Encounter: Payer: Self-pay | Admitting: Family Medicine

## 2023-09-17 IMAGING — CT CT HEART MORPH/PULM VEIN W/ CM & W/O CA SCORE
2 of 6 series · 12 of 20 positions shown, 14 images · non-contrast
Comparison: Cardiac CT 03/24/2019.
COMPARISON: Cardiac CT 03/24/2019.

Addendum:
EXAM:
OVER-READ INTERPRETATION  CT CHEST

The following report is an over-read performed by radiologist Dr.
Klever Jumper [REDACTED] on 01/30/2021. This
over-read does not include interpretation of cardiac or coronary
anatomy or pathology. The coronary calcium score/coronary CTA
interpretation by the cardiologist is attached.
CLINICAL DATA: Atrial fibrillation scheduled for an ablation.
Cardiac CT/CTA
TECHNIQUE: The patient was scanned on a Siemens Somatom scanner.

[Series 8: 0-90% · axial · 0.39mm/px · z∈[+1201,+1284]mm · 6 of 2320 slices shown, 8 images]
[im 332/2320  vessel]
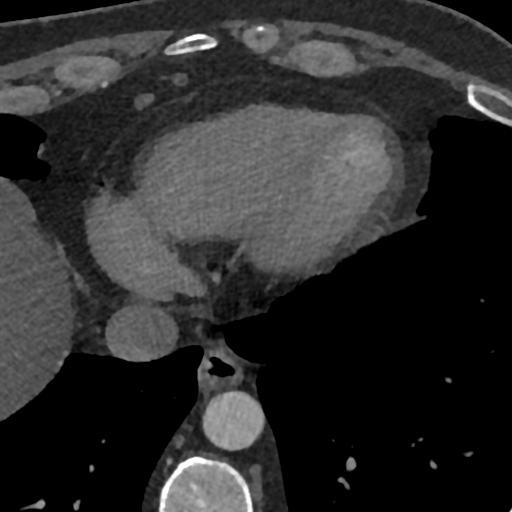
[im 332/2320  lung]
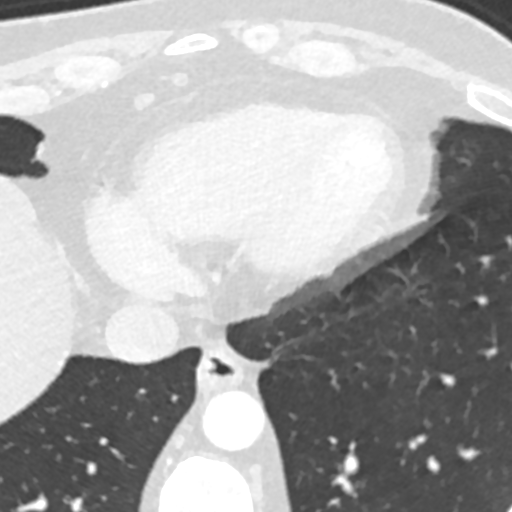
[im 663/2320  vessel]
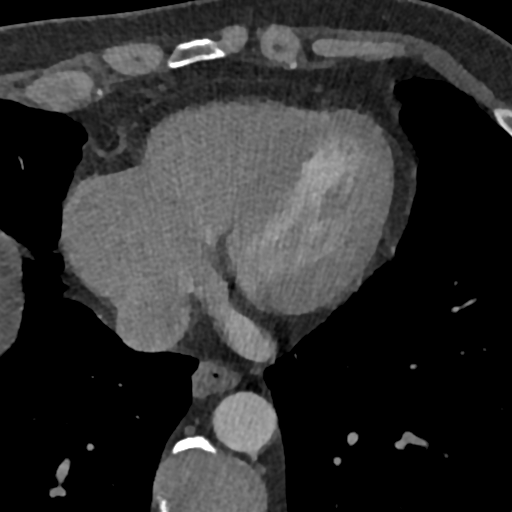
[im 994/2320  vessel]
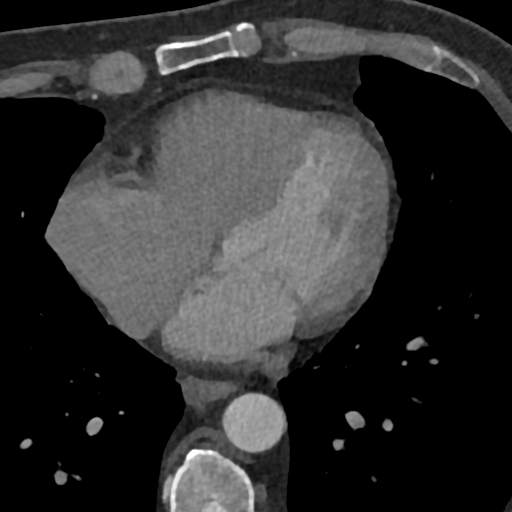
[im 1326/2320  vessel]
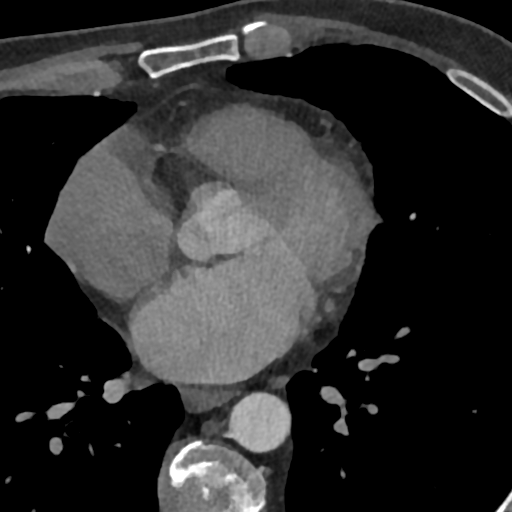
[im 1657/2320  vessel]
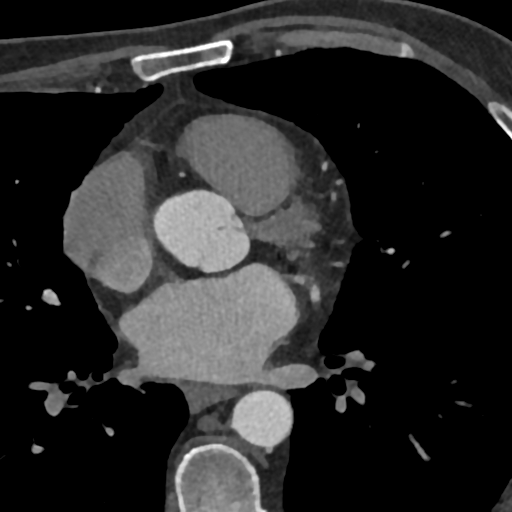
[im 1657/2320  lung]
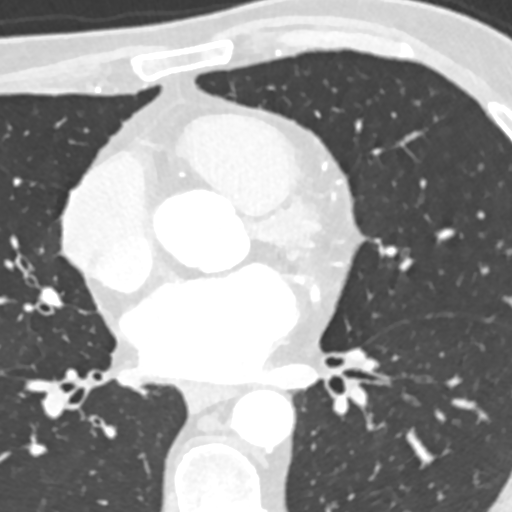
[im 1988/2320  vessel]
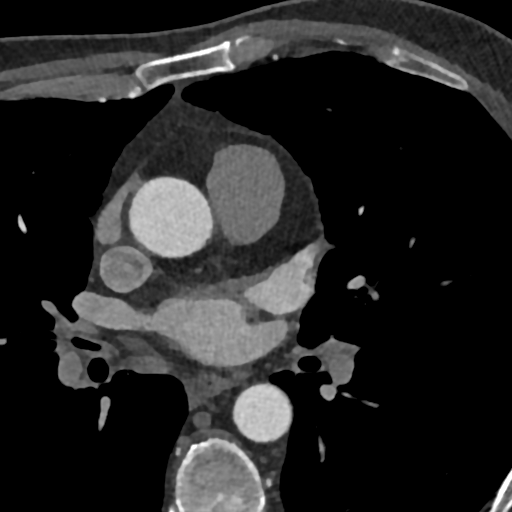

[Series 9: 5-95% · axial · 0.39mm/px · z∈[+1201,+1284]mm · 6 of 2320 slices shown]
[im 332/2320  vessel]
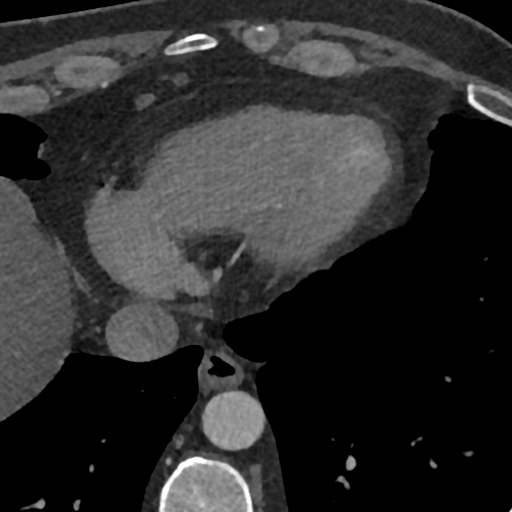
[im 663/2320  vessel]
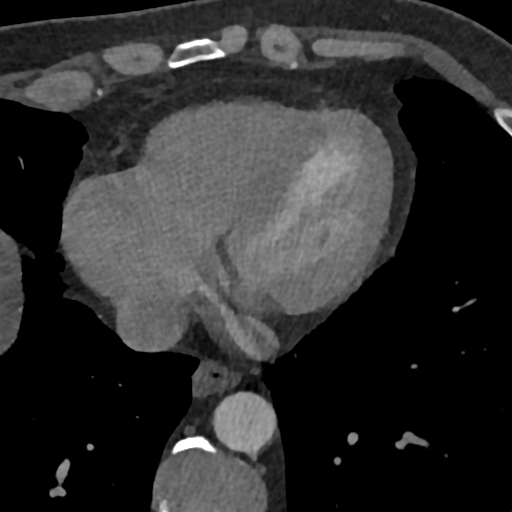
[im 994/2320  vessel]
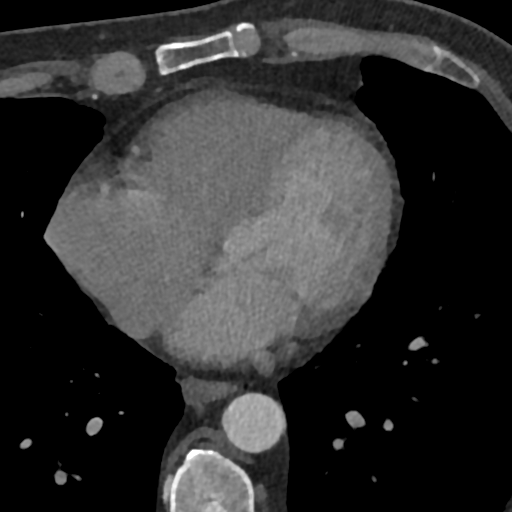
[im 1326/2320  vessel]
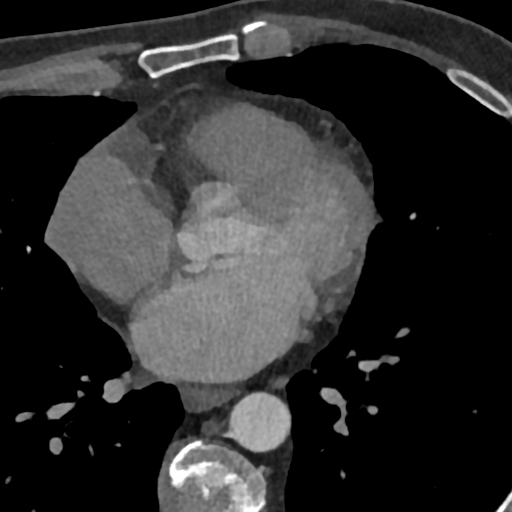
[im 1657/2320  vessel]
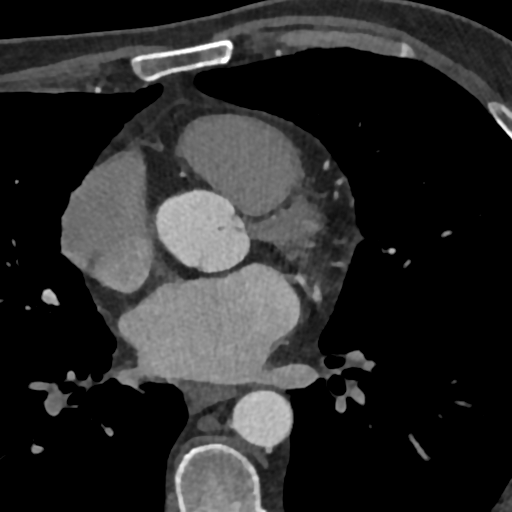
[im 1988/2320  vessel]
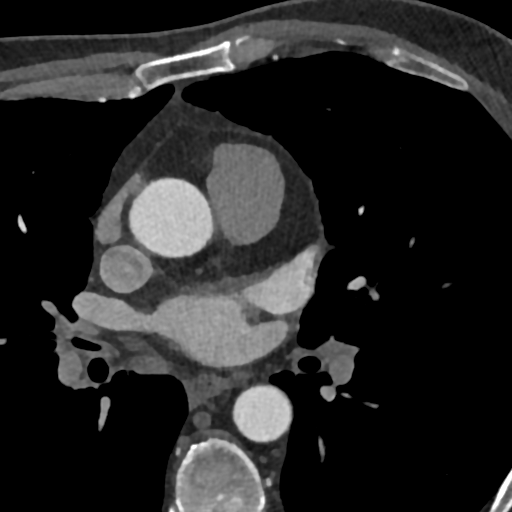

[12 of 20 positions shown; findings below may reference images not displayed]

FINDINGS: Within the visualized portions of the thorax there are no suspicious
appearing pulmonary nodules or masses, there is no acute
consolidative airspace disease, no pleural effusions, no
pneumothorax and no lymphadenopathy. Visualized portions of the
upper abdomen demonstrates diffuse low attenuation throughout the
visualized hepatic parenchyma. There are no aggressive appearing
lytic or blastic lesions noted in the visualized portions of the
skeleton.
IMPRESSION: 1. Hepatic steatosis.
FINDINGS: A 120 kV prospective scan was triggered in the descending thoracic
aorta at 111 HU's. Gantry rotation speed was 280 msecs and
collimation was .9 mm. No beta blockade and no NTG was given. The 3D
data set was reconstructed in 5% intervals of the 0-90% of the R-R
cycle. Diastolic phases were analyzed on a dedicated work station
using MPR, MIP and VRT modes. The patient received 80 cc of
contrast.

There is normal pulmonary vein drainage into the left atrium (2 on
the right and 2 on the left) with ostial measurements as follows:

RUPV: 16.2 mm x 14.2 mm, area 1.80 cm2

RLPV: 12.5 mm x 11.9 mm, area 1.14 cm2

LUPV: 19.3 mm x 11.2 mm, area 1.75 cm2

LLPV: 12.1 mm x 10.2 mm, area 0.943 cm2

The left atrial appendage is large chicken wing type with a single
lobe and ostial size 23 x 19 mm and length 32 mm. There is no
thrombus in the left atrial appendage.

The esophagus runs to the right of the spine and is not in the
proximity to any of the pulmonary veins.

Aorta:  Normal caliber.  No dissection or calcifications.

Aortic Valve:  Trileaflet.  No calcifications.

Coronary Arteries: Normal coronary origin. Right dominance. The
study was performed without use of NTG and insufficient for plaque
evaluation.

Calcium score: Coronary calcium score of 0. This was 0 percentile
for age-, race-, and sex-matched controls.
IMPRESSION: 1. There is normal pulmonary vein drainage into the left atrium.

2. The left atrial appendage is large chicken wing type with a
single lobe and ostial size 23 x 19 mm and length 32 mm. There is no
thrombus in the left atrial appendage.

3. The esophagus runs to the right of spine and is not in the
proximity to any of the pulmonary veins.

4. Calcium score: Coronary calcium score of 0. This was 0 percentile
for age-, race-, and sex-matched controls.

*** End of Addendum ***
EXAM:
OVER-READ INTERPRETATION  CT CHEST

The following report is an over-read performed by radiologist Dr.
Klever Jumper [REDACTED] on 01/30/2021. This
over-read does not include interpretation of cardiac or coronary
anatomy or pathology. The coronary calcium score/coronary CTA
interpretation by the cardiologist is attached.
FINDINGS: Within the visualized portions of the thorax there are no suspicious
appearing pulmonary nodules or masses, there is no acute
consolidative airspace disease, no pleural effusions, no
pneumothorax and no lymphadenopathy. Visualized portions of the
upper abdomen demonstrates diffuse low attenuation throughout the
visualized hepatic parenchyma. There are no aggressive appearing
lytic or blastic lesions noted in the visualized portions of the
skeleton.
IMPRESSION: 1. Hepatic steatosis.

## 2023-10-03 ENCOUNTER — Other Ambulatory Visit: Payer: Self-pay | Admitting: Family Medicine

## 2023-10-24 ENCOUNTER — Other Ambulatory Visit: Payer: Self-pay | Admitting: Family Medicine

## 2023-10-29 ENCOUNTER — Telehealth: Payer: Self-pay | Admitting: Family Medicine

## 2023-10-29 NOTE — Telephone Encounter (Signed)
 Copied from CRM [id number]. Topic: Clinical - Medication Refill >> Oct 29, 2023  2:07 PM Viola F wrote: Medication: diltiazem  (CARDIZEM  CD) 180 MG 24 hr capsule [[id number]]  Has the patient contacted their pharmacy? Yes (Agent: If no, request that the patient contact the pharmacy for the refill. If patient does not wish to contact the pharmacy document the reason why and proceed with request.) (Agent: If yes, when and what did the pharmacy advise?)  This is the patient's preferred pharmacy:  [id number] [address], [place] - [address] [place] [address] Phone: [phone] Fax: [phone]   Is this the correct pharmacy for this prescription? Yes If no, delete pharmacy and type the correct one.   Has the prescription been filled recently? Yes  Is the patient out of the medication? Yes  Has the patient been seen for an appointment in the last year OR does the patient have an upcoming appointment? Yes  Can we respond through MyChart? Yes  Agent: Please be advised that Rx refills may take up to 3 business days. We ask that you follow-up with your pharmacy.

## 2023-10-30 ENCOUNTER — Other Ambulatory Visit: Payer: Self-pay | Admitting: Cardiology

## 2023-11-30 ENCOUNTER — Other Ambulatory Visit: Payer: Self-pay | Admitting: Cardiology

## 2023-12-11 IMAGING — DX DG ANKLE COMPLETE 3+V*L*
3 series · 3 of 3 positions shown · non-contrast
Comparison: None.

CLINICAL DATA: Twisting injury several days ago with persistent
lateral ankle pain, initial encounter

EXAM:
LEFT ANKLE COMPLETE - 3+ VIEW

[ankle ap]
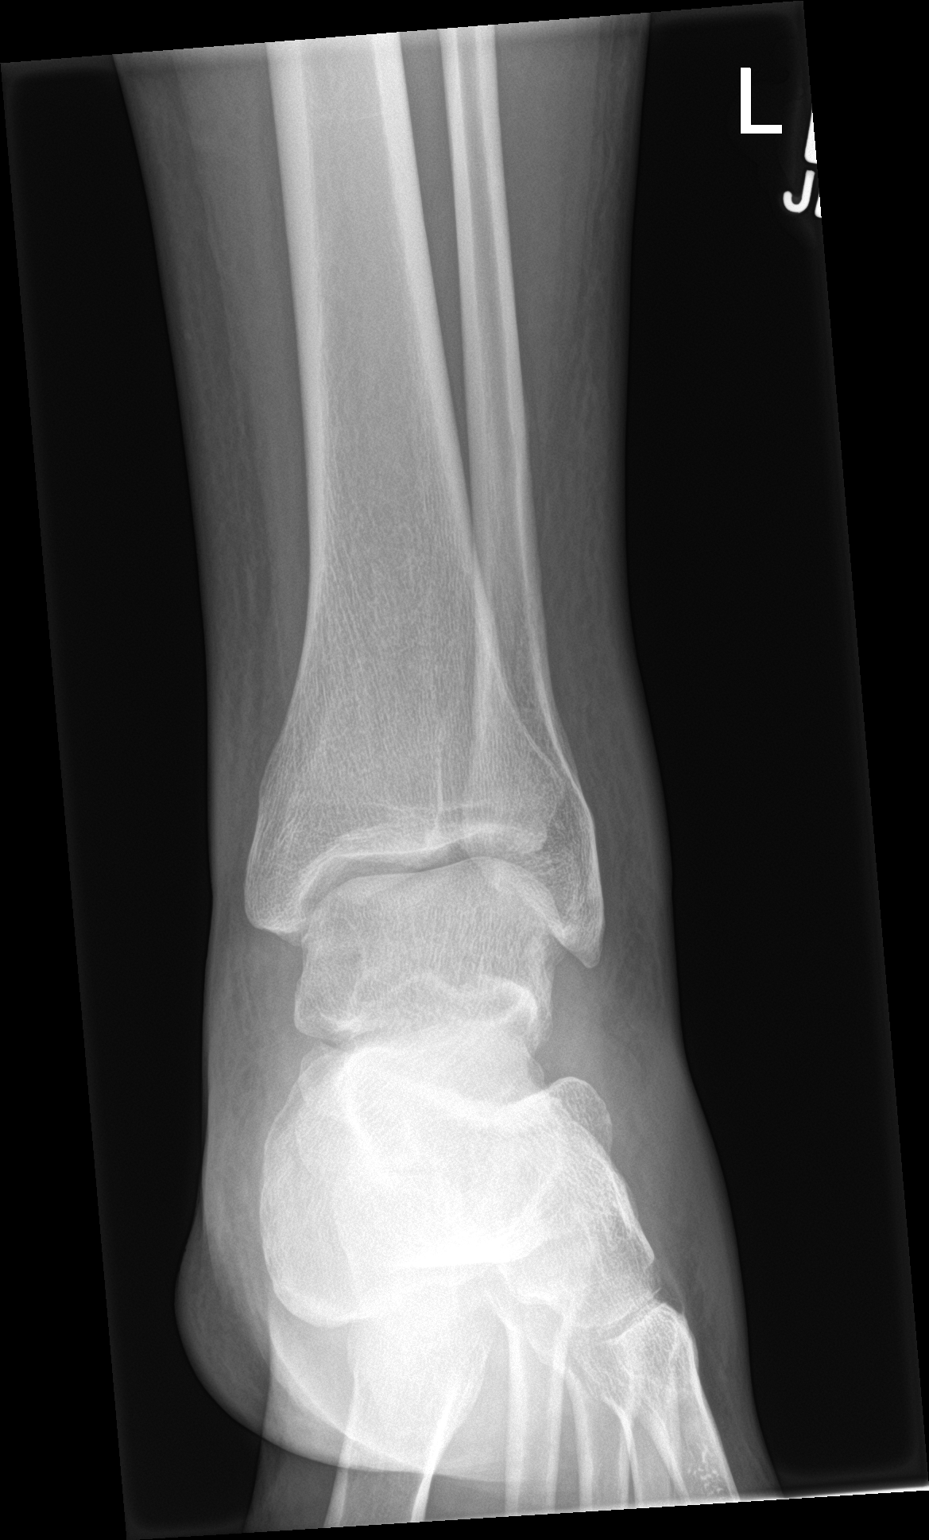

[ankle obl]
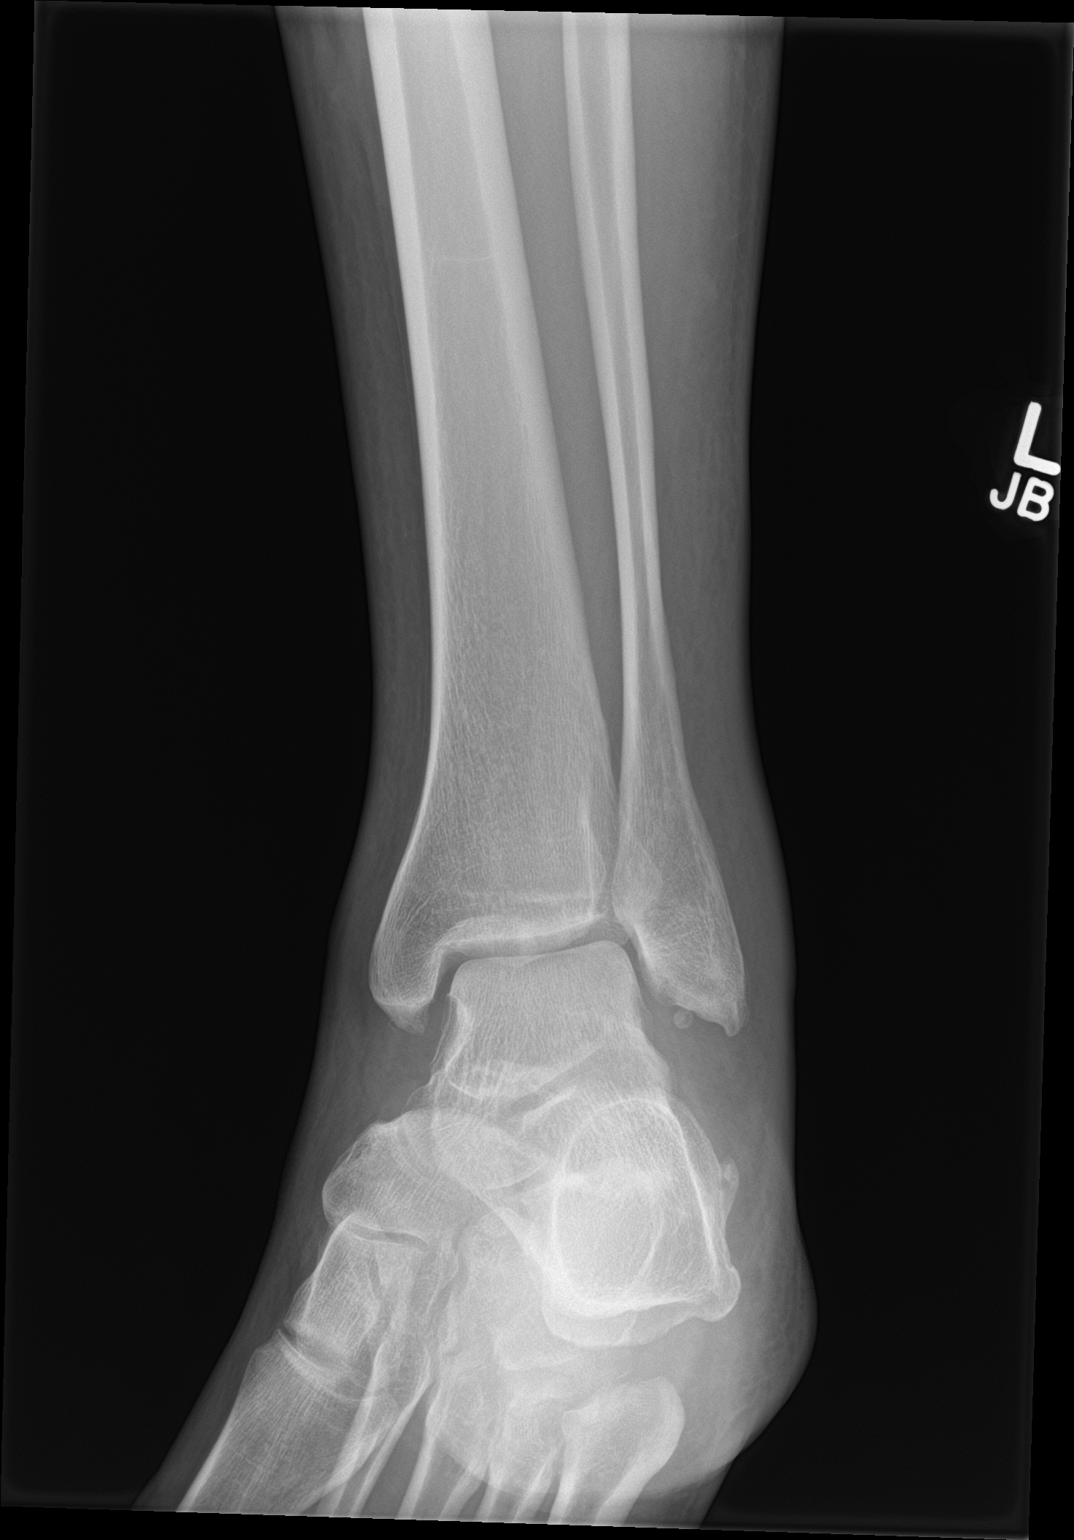

[ankle lat]
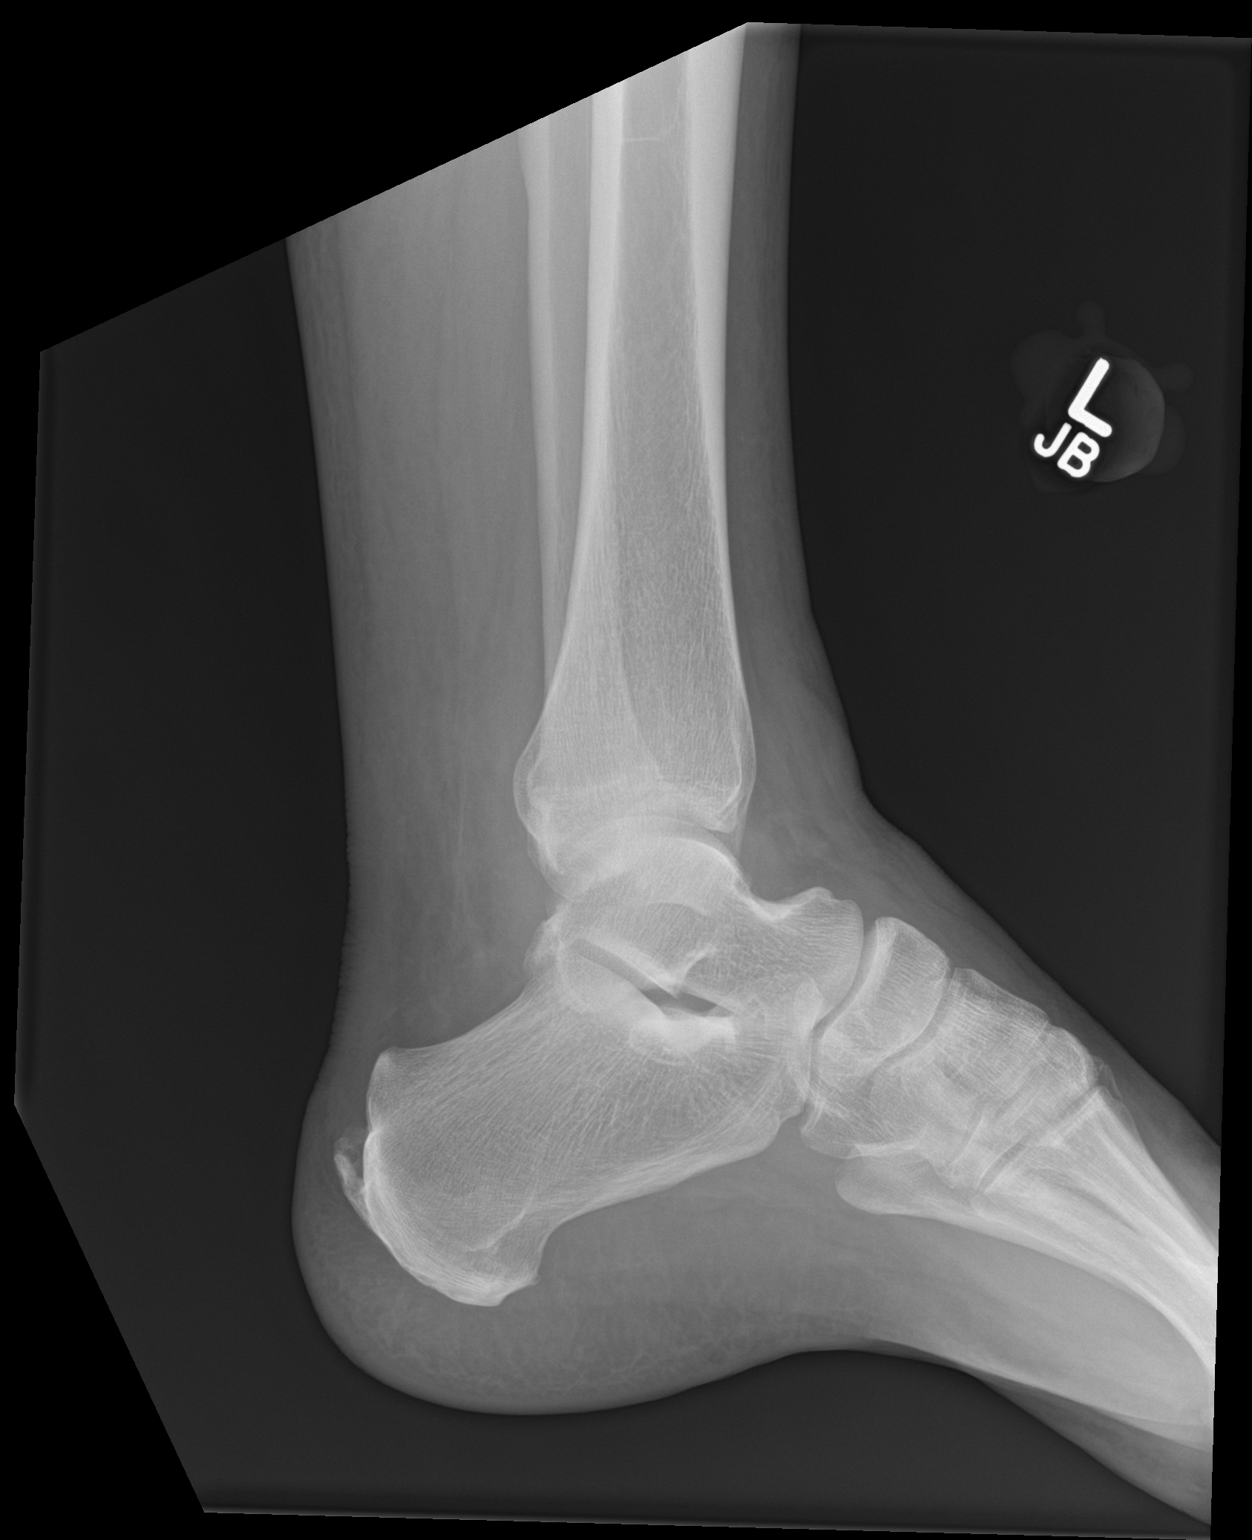

[3 of 3 positions shown; findings below may reference images not displayed]

FINDINGS: Mild lateral soft tissue swelling is noted. Well corticated bony
density is noted consistent with prior trauma and nonunion adjacent
to the distal fibula. No acute fracture or dislocation is noted.
Calcaneal spurring is seen.
IMPRESSION: Soft tissue swelling without acute bony abnormality.

## 2023-12-14 ENCOUNTER — Other Ambulatory Visit: Payer: Self-pay | Admitting: Cardiology

## 2023-12-29 ENCOUNTER — Other Ambulatory Visit: Payer: Self-pay | Admitting: Cardiology

## 2024-01-04 ENCOUNTER — Encounter: Payer: Self-pay | Admitting: Sports Medicine

## 2024-01-10 ENCOUNTER — Other Ambulatory Visit: Payer: Self-pay | Admitting: Family Medicine

## 2024-01-10 ENCOUNTER — Telehealth: Payer: Self-pay | Admitting: Family Medicine

## 2024-01-10 MED ORDER — DILTIAZEM HCL ER COATED BEADS 180 MG PO CP24: 180.0000 mg | ORAL_CAPSULE | Freq: Every day | ORAL | 1 refills | Status: AC

## 2024-01-10 NOTE — Telephone Encounter (Signed)
 Request has already been submitted.    Outpatient Medication Detail   Disp Refills Start End  diltiazem  (CARDIZEM  CD) 180 MG 24 hr capsule 90 capsule 1 01/10/2024 --  Sig - Route: Take 1 capsule (180 mg total) by mouth daily. - Oral  Sent to pharmacy as: diltiazem  (CARDIZEM  CD) 180 MG 24 hr capsule  E-Prescribing Status: Receipt confirmed by pharmacy (01/10/2024  1:16 PM EDT)

## 2024-01-10 NOTE — Telephone Encounter (Signed)
 Cardizem  rx sent

## 2024-01-10 NOTE — Telephone Encounter (Signed)
 Copied from CRM [id number]. Topic: Clinical - Medication Refill >> Jan 10, 2024 12:28 PM Frederich PARAS wrote: Medication: diltiazem  (CARDIZEM  CD) 180 MG 24 hr capsule  Has the patient contacted their pharmacy? Yes says they were waiting on dr approval.  This is the patient's preferred pharmacy:  [id number] [address], [place] - [address] [place] [address] Phone: [phone] Fax: [phone]   Is this the correct pharmacy for this prescription? Yes If no, delete pharmacy and type the correct one.   Has the prescription been filled recently? Yes  Is the patient out of the medication? Yes  Has the patient been seen for an appointment in the last year OR does the patient have an upcoming appointment? Yes  Can we respond through MyChart? Yes  Agent: Please be advised that Rx refills may take up to 3 business days. We ask that you follow-up with your pharmacy.

## 2024-02-01 ENCOUNTER — Other Ambulatory Visit: Payer: Self-pay | Admitting: Family Medicine

## 2024-02-24 ENCOUNTER — Other Ambulatory Visit: Payer: Self-pay | Admitting: Family Medicine

## 2024-02-26 ENCOUNTER — Other Ambulatory Visit: Payer: Self-pay | Admitting: Family Medicine

## 2024-03-15 ENCOUNTER — Other Ambulatory Visit: Payer: Self-pay | Admitting: Family Medicine

## 2024-03-16 ENCOUNTER — Other Ambulatory Visit: Payer: Self-pay | Admitting: Family Medicine

## 2024-03-16 MED ORDER — ESCITALOPRAM OXALATE 10 MG PO TABS: 10.0000 mg | ORAL_TABLET | Freq: Every day | ORAL | 0 refills | Status: DC

## 2024-03-16 NOTE — Telephone Encounter (Signed)
 Copied from CRM [id number]. Topic: Clinical - Medication Refill >> Mar 16, 2024  2:19 PM Victoria A wrote: Medication: escitalopram  (LEXAPRO ) 10 MG tablet  Has the patient contacted their pharmacy? Yes (Agent: If no, request that the patient contact the pharmacy for the refill. If patient does not wish to contact the pharmacy document the reason why and proceed with request.) (Agent: If yes, when and what did the pharmacy advise?)  This is the patient's preferred pharmacy:  [id number] [address], [place] - [address] [place] [address] Phone: [phone] Fax: [phone]   Is this the correct pharmacy for this prescription? Yes If no, delete pharmacy and type the correct one.   Has the prescription been filled recently? No  Is the patient out of the medication? Yes  Has the patient been seen for an appointment in the last year OR does the patient have an upcoming appointment? No  Can we respond through MyChart? Yes  Agent: Please be advised that Rx refills may take up to 3 business days. We ask that you follow-up with your pharmacy.

## 2024-03-16 NOTE — Telephone Encounter (Signed)
Pt was sent MyChart message

## 2024-04-05 ENCOUNTER — Telehealth: Payer: Self-pay | Admitting: Family Medicine

## 2024-04-05 NOTE — Telephone Encounter (Unsigned)
 Copied from CRM [id number]. Topic: Clinical - Medication Refill >> Apr 05, 2024  3:46 PM Alfonso ORN wrote: Medication:  escitalopram  (LEXAPRO ) 10 MG tablet   Has the patient contacted their pharmacy? No   This is the patient's preferred pharmacy:  [id number] [address], [place] - [address] [place] [address] Phone: [phone] Fax: [phone]   Is this the correct pharmacy for this prescription? Yes If no, delete pharmacy and type the correct one.   Has the prescription been filled recently? Yes  Is the patient out of the medication? Yes  Has the patient been seen for an appointment in the last year OR does the patient have an upcoming appointment? Yes  Can we respond through MyChart? Yes

## 2024-04-06 ENCOUNTER — Other Ambulatory Visit: Payer: Self-pay | Admitting: Family Medicine

## 2024-04-06 MED ORDER — ESCITALOPRAM OXALATE 10 MG PO TABS: 10.0000 mg | ORAL_TABLET | Freq: Every day | ORAL | 0 refills | Status: DC

## 2024-04-07 ENCOUNTER — Other Ambulatory Visit: Payer: Self-pay | Admitting: Family Medicine

## 2024-04-10 ENCOUNTER — Encounter: Payer: Self-pay | Admitting: Family Medicine

## 2024-04-10 ENCOUNTER — Ambulatory Visit: Admitting: Family Medicine

## 2024-04-10 VITALS — BP 137/74 | HR 79 | Temp 98.0°F | Ht 74.0 in | Wt 204.0 lb

## 2024-04-10 DIAGNOSIS — R7401 Elevation of levels of liver transaminase levels: Secondary | ICD-10-CM

## 2024-04-10 DIAGNOSIS — E119 Type 2 diabetes mellitus without complications: Secondary | ICD-10-CM

## 2024-04-10 DIAGNOSIS — E7800 Pure hypercholesterolemia, unspecified: Secondary | ICD-10-CM | POA: Diagnosis not present

## 2024-04-10 DIAGNOSIS — I480 Paroxysmal atrial fibrillation: Secondary | ICD-10-CM

## 2024-04-10 DIAGNOSIS — R809 Proteinuria, unspecified: Secondary | ICD-10-CM

## 2024-04-10 DIAGNOSIS — I10 Essential (primary) hypertension: Secondary | ICD-10-CM | POA: Diagnosis not present

## 2024-04-10 DIAGNOSIS — Z125 Encounter for screening for malignant neoplasm of prostate: Secondary | ICD-10-CM | POA: Diagnosis not present

## 2024-04-10 DIAGNOSIS — Z23 Encounter for immunization: Secondary | ICD-10-CM

## 2024-04-10 DIAGNOSIS — E1129 Type 2 diabetes mellitus with other diabetic kidney complication: Secondary | ICD-10-CM | POA: Diagnosis not present

## 2024-04-10 DIAGNOSIS — Z0000 Encounter for general adult medical examination without abnormal findings: Secondary | ICD-10-CM

## 2024-04-10 LAB — POCT GLYCOSYLATED HEMOGLOBIN (HGB A1C)
HbA1c POC (<> result, manual entry): 7.6 % (ref 4.0–5.6)
HbA1c, POC (controlled diabetic range): 7.6 % — AB (ref 0.0–7.0)
HbA1c, POC (prediabetic range): 7.6 % — AB (ref 5.7–6.4)
Hemoglobin A1C: 7.6 % — AB (ref 4.0–5.6)

## 2024-04-10 MED ORDER — ESCITALOPRAM OXALATE 10 MG PO TABS: 10.0000 mg | ORAL_TABLET | Freq: Every day | ORAL | 3 refills | Status: AC

## 2024-04-10 MED ORDER — APIXABAN 5 MG PO TABS: 5.0000 mg | ORAL_TABLET | Freq: Two times a day (BID) | ORAL | 1 refills | Status: AC

## 2024-04-10 MED ORDER — METOPROLOL SUCCINATE ER 25 MG PO TB24: 25.0000 mg | ORAL_TABLET | Freq: Every day | ORAL | 0 refills | Status: AC

## 2024-04-10 MED ORDER — LISINOPRIL 40 MG PO TABS: ORAL_TABLET | ORAL | 3 refills | Status: AC

## 2024-04-10 MED ORDER — METFORMIN HCL 1000 MG PO TABS: 1000.0000 mg | ORAL_TABLET | Freq: Two times a day (BID) | ORAL | 3 refills | Status: AC

## 2024-04-10 NOTE — Progress Notes (Signed)
 Office Note 04/10/2024  CC:  Chief Complaint  Patient presents with   Medical Management of Chronic Issues   HPI:  Patient is a 58 y.o. male who is here for annual health maintenance exam and 4-month follow-up diabetes, hypertension, and alcohol dependence. A/P as of last visit: #1 poorly controlled diabetes. POC Hba1c is 9.8% today. He declines insulin  and wants to try 1 month of improving diet and abstaining from alcohol to see if numbers significantly improve first. Will continue him on metformin  1000 mg twice a day. Urine microalbumin/creatinine today.   #2 alcohol dependence. History of mild withdrawal in the past. He will stop alcohol today. Will start lorazepam  0.5 mg tabs, 1 tab 3 times daily x 7 days, then twice daily x 7 days, then 1/day for 7 days, then half tab daily for 7 days.   #3 GAD, doing well on Lexapro  10 mg a day.  Refilled today.   4.  Hypertension, good control on lisinopril  40 mg a day and Toprol -XL 25 mg a day.   5. Hypercholesterolemia. He is not taking crestor  b/c he is apprehensive because of side effects/fatigue from pravastatin in the past. Will address this problem in the near future.   6.  Transaminasemia. Suspect combo of fatty liver and alcohol abuse. Viral hep screening negative in the past. Monitor  hepatic panel today.  INTERIM HX: He feels well. He stopped drinking.  He is taking lisinopril  40 mg a day for blood pressure. Toprol -XL is on his med list but he has not been on this in quite a while. He takes Cardizem  CD1 180 mg a day.  Past Medical History:  Diagnosis Date   Anxiety attack    Elevated transaminase level    suspect combo of hyperlip/fatty liver + alcohol.  Viral Hep panel neg 2016.   Gout    multiple episodes (L MTP jt)   History of gluten intolerance    Hyperlipemia    Intolerant of statins: tolerates welchol   Hypertension    Left foot pain 2020   cavus foot type resulting in midfoot capsulitis; posterior  heel spur; arthritis first MPJ   Perennial allergic rhinitis    Persistent atrial fibrillation (HCC) 2013; 2019   Dr. Lavona.  Spont conv in ED.  Outpt eval showed nl ECHO--ASA qd and BB/CCB started.  Multiple cardioversions and recurrences, finally got ablation procedure.03/2019--no OAC b/c CHADVASC score low. 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 DM 2 dx 07/2019    Past Surgical History:  Procedure Laterality Date   ATRIAL FIBRILLATION ABLATION N/A 03/31/2019   Procedure: ATRIAL FIBRILLATION ABLATION;  Surgeon: Kelsie Agent, MD;  Location: [facility] INVASIVE [facility];  Service: Cardiovascular;  Laterality: N/A;   ATRIAL FIBRILLATION ABLATION N/A 02/06/2021   Procedure: ATRIAL FIBRILLATION ABLATION;  Surgeon: Kelsie Agent, MD;  Location: [facility];  Service: Cardiovascular;  Laterality: N/A;   CARDIOVERSION N/A 06/11/2017   Procedure: CARDIOVERSION;  Surgeon: Raford Riggs, MD;  Location: [facility] ENDOSCOPY;  Service: Cardiovascular;  Laterality: N/A;   CARDIOVERSION N/A 01/23/2019   Procedure: CARDIOVERSION;  Surgeon: Lonni Slain, MD;  Location: [facility] ENDOSCOPY;  Service: Cardiovascular;  Laterality: N/A;   CARDIOVERSION N/A 05/24/2020   Procedure: CARDIOVERSION;  Surgeon: Jeffrie Oneil BROCKS, MD;  Location: [facility] ENDOSCOPY;  Service: Cardiovascular;  Laterality: N/A;   COLONOSCOPY  06/27/2021   06/2021->2 adenomas->recall 3 yrs per GI MD   DC  cardioversion  06/11/2017   was back in A-fib within 5 min of waking up.   TRANSTHORACIC ECHOCARDIOGRAM  04/2012   2013-NORMAL (ED 55-65%).  01/2021 EF 60-65%, valves normal. 12/2020 EF 65-70%, normal.    Family History  Problem Relation Age of Onset   Heart disease Father        pacemaker, atrial fibrillation   Hypertension Father    Colon cancer Neg Hx    Colon polyps Neg Hx    Esophageal cancer Neg Hx    Stomach cancer Neg Hx    Rectal cancer Neg Hx      Social History   Socioeconomic History   Marital status: Married    Spouse name: Not on file   Number of children: 2   Years of education: Not on file   Highest education level: Not on file  Occupational History   Occupation: Radio broadcast assistant  Tobacco Use   Smoking status: Never   Smokeless tobacco: Never  Vaping Use   Vaping status: Never Used  Substance and Sexual Activity   Alcohol use: Not Currently    Alcohol/week: 4.0 - 5.0 standard drinks of alcohol    Types: 4 - 5 Cans of beer per week    Comment: daily   Drug use: No   Sexual activity: Not on file  Other Topics Concern   Not on file  Social History Narrative   Lives at home with wife and two children.     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  Alc: 4 beers per day.  Denies hx of alc problems/abuse.  However, he says he tried quitting in the past and after a few days he started to have panic attacks.         Social Drivers of Corporate Investment Banker Strain: Not on file  Food Insecurity: Not on file  Transportation Needs: Not on file  Physical Activity: Not on file  Stress: Not on file  Social Connections: Not on file  Intimate Partner Violence: Not on file    Outpatient Medications Prior to Visit  Medication Sig Dispense Refill   cetirizine (ZYRTEC) 10 MG tablet Take 10 mg by mouth daily.     clotrimazole -betamethasone  (LOTRISONE ) cream APPLY TO AFFECTED AREA TWICE A DAY. OFFICE VISIT NEEDED FOR FURTHER REFILLS 15 g 0   diltiazem  (CARDIZEM  CD) 180 MG 24 hr capsule Take 1 capsule (180 mg total) by mouth daily. 90 capsule 1   metFORMIN  (GLUCOPHAGE ) 1000 MG tablet TAKE 1 TABLET BY MOUTH TWICE A DAY 28 tablet 0   escitalopram  (LEXAPRO ) 10 MG tablet Take 1 tablet (10 mg total) by mouth daily. OFFICE VISIT NEEDED FOR FURTHER REFILLS 14 tablet 0   lisinopril  (ZESTRIL ) 40 MG tablet TAKE 1 TABLET BY MOUTH EVERYDAY AT BEDTIME 30 tablet 0   LORazepam   (ATIVAN ) 0.5 MG tablet 1 tid x 7d, then 1 bid x 7d, then 1 every day x 7d, then 1/2 every day x 6d (Patient not taking: Reported on 04/10/2024) 45 tablet 0   metoprolol  succinate (TOPROL -XL) 25 MG 24 hr tablet Take 1 tablet (25 mg total) by mouth daily. (Patient not taking: Reported on 04/10/2024) 15 tablet 0   rosuvastatin  (CRESTOR ) 5 MG tablet TAKE 1 TABLET (5 MG TOTAL) BY MOUTH DAILY. (Patient not taking: Reported on 04/10/2024) 30 tablet 0   traZODone  (DESYREL ) 50 MG tablet TAKE 1-2 TABLETS BY MOUTH AT BEDTIME FOR INSOMNIA. OFFICE VISIT NEEDED  FOR FURTHER REFILLS (Patient not taking: Reported on 04/10/2024) 30 tablet 0   No facility-administered medications prior to visit.    Allergies  Allergen Reactions   Acetaminophen  Diarrhea   Pravastatin     Decreased energy, wiped out    Review of Systems  Constitutional:  Negative for appetite change, chills, fatigue and fever.  HENT:  Negative for congestion, dental problem, ear pain and sore throat.   Eyes:  Negative for discharge, redness and visual disturbance.  Respiratory:  Negative for cough, chest tightness, shortness of breath and wheezing.   Cardiovascular:  Negative for chest pain, palpitations and leg swelling.  Gastrointestinal:  Negative for abdominal pain, blood in stool, diarrhea, nausea and vomiting.  Genitourinary:  Negative for difficulty urinating, dysuria, flank pain, frequency, hematuria and urgency.  Musculoskeletal:  Negative for arthralgias, back pain, joint swelling, myalgias and neck stiffness.  Skin:  Negative for pallor and rash.  Neurological:  Negative for dizziness, speech difficulty, weakness and headaches.  Hematological:  Negative for adenopathy. Does not bruise/bleed easily.  Psychiatric/Behavioral:  Negative for confusion and sleep disturbance. The patient is not nervous/anxious.    PE;    04/10/2024    2:54 PM 04/10/2024    2:50 PM 08/05/2023    2:34 PM  Vitals with BMI  Height  6' 2 6' 2  Weight  204  lbs 198 lbs 6 oz  BMI  26.18 25.46  Systolic 137 144 871  Diastolic 74 88 87  Pulse  79 72   Gen: Alert, well appearing.  Patient is oriented to person, place, time, and situation. AFFECT: pleasant, lucid thought and speech. ENT: Ears: EACs clear, normal epithelium.  TMs with good light reflex and landmarks bilaterally.  Eyes: no injection, icteris, swelling, or exudate.  EOMI, PERRLA. Nose: no drainage or turbinate edema/swelling.  No injection or focal lesion.  Mouth: lips without lesion/swelling.  Oral mucosa pink and moist.  Dentition intact and without obvious caries or gingival swelling.  Oropharynx without erythema, exudate, or swelling.  Neck: supple/nontender.  No LAD, mass, or TM.  Carotid pulses 2+ bilaterally, without bruits. CV: Irregularly irregular, rate 90-110, no m/r/g.   LUNGS: CTA bilat, nonlabored resps, good aeration in all lung fields. ABD: soft, NT, ND, BS normal.  No hepatospenomegaly or mass.  No bruits. EXT: no clubbing, cyanosis, or edema.  Musculoskeletal: no joint swelling, erythema, warmth, or tenderness.  ROM of all joints intact. Skin - no sores or suspicious lesions or rashes or color changes Foot exam -  no swelling, tenderness or skin or vascular lesions. Color and temperature is normal. Sensation is intact. Peripheral pulses are palpable. Toenails are normal.  Pertinent labs:  Lab Results  Component Value Date   TSH 2.32 01/12/2022   Lab Results  Component Value Date   WBC 9.4 06/01/2022   HGB 15.8 06/01/2022   HCT 45.8 06/01/2022   MCV 91.1 06/01/2022   PLT 210 06/01/2022   Lab Results  Component Value Date   CREATININE 0.70 08/05/2023   BUN 14 08/05/2023   NA 135 08/05/2023   K 4.1 08/05/2023   CL 97 08/05/2023   CO2 27 08/05/2023   Lab Results  Component Value Date   ALT 134 (H) 08/05/2023   AST 125 (H) 08/05/2023   ALKPHOS 73 08/05/2023   BILITOT 0.9 08/05/2023   Lab Results  Component Value Date   CHOL 174 02/18/2023   Lab  Results  Component Value Date   HDL 27.90 (L)  02/18/2023   Lab Results  Component Value Date   LDLCALC 102 (H) 02/18/2023   Lab Results  Component Value Date   TRIG 221.0 (H) 02/18/2023   Lab Results  Component Value Date   CHOLHDL 6 02/18/2023   Lab Results  Component Value Date   PSA 0.29 02/18/2023   PSA 0.30 01/12/2022   PSA 0.26 12/06/2020   Lab Results  Component Value Date   HGBA1C 7.6 (A) 04/10/2024   HGBA1C 7.6 04/10/2024   HGBA1C 7.6 (A) 04/10/2024   HGBA1C 7.6 (A) 04/10/2024   Twelve-lead EKG: Atrial flutter-fibrillation, rate 116, no ST segment abnormalities. Compared to EKG June 04, 2022 the atrial fibrillation is new.  ASSESSMENT AND PLAN:   #1 health maintenance exam: Reviewed age and gender appropriate health maintenance issues (prudent diet, regular exercise, health risks of tobacco and excessive alcohol, use of seatbelts, fire alarms in home, use of sunscreen).  Also reviewed age and gender appropriate health screening as well as vaccine recommendations. Vaccines: flu->given today.  Otherwise UTD. Labs: Health panel, PSA Prostate ca screening: PSA today Colon ca screening: 2 adenomas on colonoscopy 2023.  Repeat 2026.  #2 atrial fibrillation.  History of A-fib ablation x 2, most recent in 2022. He is asymptomatic. This patients CHA2DS2-VASc Score and unadjusted Ischemic Stroke Rate (% per year) is 2.2% stroke rate/year from a score of 2. Will start Eliquis  5 mg twice daily. Add Toprol -XL 25 mg a day. Continue Cardizem  CD 180 mg a day. He will arrange follow-up appointment with his cardiologist.  #3 diabetes without complication.  Control is improved.  He has made good strides from a lifestyle modification standpoint, particularly quitting alcohol. Point-of-care hemoglobin A1c today is 7.6%.  Feet exam normal today. Continue metformin  1000 mg twice a day. Monitor electrolytes, serum creatinine, and urine microalbumin/creatinine today.  #4  hypertension, not ideal control.  We are adding Toprol -XL 25 mg a day back to his regimen.  He will continue lisinopril  40 mg a day. Monitor electrolytes and creatinine today.  #5 hypercholesterolemia. Statin intolerant (rosuvastatin  and pravastatin). Lipid panel today.  An After Visit Summary was printed and given to the patient.  FOLLOW UP:  Return in about 3 months (around 07/09/2024) for routine chronic illness f/u.  Signed:  Gerlene Hockey, MD           04/10/2024

## 2024-04-10 NOTE — Patient Instructions (Signed)
 Arrange a follow up appointment with your cardiologist to discuss the recurrence of your atrial fibrillation.

## 2024-04-11 LAB — LIPID PANEL
Cholesterol: 201 mg/dL — ABNORMAL HIGH (ref 0–200)
HDL: 36.1 mg/dL — ABNORMAL LOW (ref >39.00)
LDL Cholesterol: 118 mg/dL — ABNORMAL HIGH (ref 0–99)
NonHDL: 164.55
Total CHOL/HDL Ratio: 6
Triglycerides: 235 mg/dL — ABNORMAL HIGH (ref 0.0–149.0)
VLDL: 47 mg/dL — ABNORMAL HIGH (ref 0.0–40.0)

## 2024-04-11 LAB — COMPREHENSIVE METABOLIC PANEL WITH GFR
ALT: 88 U/L — ABNORMAL HIGH (ref 0–53)
AST: 71 U/L — ABNORMAL HIGH (ref 0–37)
Albumin: 4.3 g/dL (ref 3.5–5.2)
Alkaline Phosphatase: 54 U/L (ref 39–117)
BUN: 18 mg/dL (ref 6–23)
CO2: 26 meq/L (ref 19–32)
Calcium: 9.3 mg/dL (ref 8.4–10.5)
Chloride: 101 meq/L (ref 96–112)
Creatinine, Ser: 0.9 mg/dL (ref 0.40–1.50)
GFR: 93.41 mL/min (ref >60.00)
Glucose, Bld: 218 mg/dL — ABNORMAL HIGH (ref 70–99)
Potassium: 4.3 meq/L (ref 3.5–5.1)
Sodium: 137 meq/L (ref 135–145)
Total Bilirubin: 0.6 mg/dL (ref 0.2–1.2)
Total Protein: 6.8 g/dL (ref 6.0–8.3)

## 2024-04-11 LAB — MICROALBUMIN / CREATININE URINE RATIO
Creatinine,U: 79.4 mg/dL
Microalb Creat Ratio: 315.2 mg/g — ABNORMAL HIGH (ref 0.0–30.0)
Microalb, Ur: 25 mg/dL — ABNORMAL HIGH (ref 0.0–1.9)

## 2024-04-14 ENCOUNTER — Ambulatory Visit: Payer: Self-pay | Admitting: Family Medicine

## 2024-05-19 ENCOUNTER — Other Ambulatory Visit: Payer: Self-pay | Admitting: Family Medicine
